# Patient Record
Sex: Female | Born: 1945
Health system: Southern US, Community
[De-identification: ages and names within clinical notes are randomized; demographics above are authoritative.]

## PROBLEM LIST (undated history)

## (undated) DIAGNOSIS — R739 Hyperglycemia, unspecified: Principal | ICD-10-CM

## (undated) DIAGNOSIS — I10 Essential (primary) hypertension: Secondary | ICD-10-CM

## (undated) DIAGNOSIS — Z860100 Personal history of colon polyps, unspecified: Secondary | ICD-10-CM

## (undated) DIAGNOSIS — D219 Benign neoplasm of connective and other soft tissue, unspecified: Secondary | ICD-10-CM

## (undated) DIAGNOSIS — N879 Dysplasia of cervix uteri, unspecified: Secondary | ICD-10-CM

## (undated) HISTORY — DX: Personal history of colonic polyps: Z86.010

## (undated) HISTORY — DX: Personal history of colon polyps, unspecified: Z86.0100

## (undated) HISTORY — PX: OOPHORECTOMY: SHX86

## (undated) HISTORY — DX: Benign neoplasm of connective and other soft tissue, unspecified: D21.9

## (undated) HISTORY — DX: Dysplasia of cervix uteri, unspecified: N87.9

## (undated) HISTORY — PX: BREAST EXCISIONAL BIOPSY: SUR124

## (undated) HISTORY — DX: Hyperglycemia, unspecified: R73.9

## (undated) HISTORY — PX: COLPOSCOPY: SHX161

## (undated) HISTORY — DX: Essential (primary) hypertension: I10

## (undated) HISTORY — PX: HEMORRHOID SURGERY: SHX153

---

## 1973-03-04 HISTORY — PX: LYMPH NODE BIOPSY: SHX201

## 1988-03-04 HISTORY — PX: TONSILLECTOMY: SUR1361

## 1996-03-04 HISTORY — PX: BREAST SURGERY: SHX581

## 1997-03-04 HISTORY — PX: BREAST BIOPSY: SHX20

## 1997-11-04 ENCOUNTER — Ambulatory Visit (HOSPITAL_BASED_OUTPATIENT_CLINIC_OR_DEPARTMENT_OTHER): Admission: RE | Admit: 1997-11-04 | Discharge: 1997-11-04 | Payer: Self-pay

## 1998-03-04 HISTORY — PX: BREAST BIOPSY: SHX20

## 2000-11-13 ENCOUNTER — Encounter: Payer: Self-pay | Admitting: Internal Medicine

## 2000-11-13 ENCOUNTER — Encounter: Admission: RE | Admit: 2000-11-13 | Discharge: 2000-11-13 | Payer: Self-pay | Admitting: Internal Medicine

## 2001-12-04 ENCOUNTER — Encounter: Payer: Self-pay | Admitting: Unknown Physician Specialty

## 2001-12-04 ENCOUNTER — Encounter: Admission: RE | Admit: 2001-12-04 | Discharge: 2001-12-04 | Payer: Self-pay | Admitting: Unknown Physician Specialty

## 2002-03-04 HISTORY — PX: ABDOMINAL HYSTERECTOMY: SHX81

## 2002-07-09 ENCOUNTER — Other Ambulatory Visit: Admission: RE | Admit: 2002-07-09 | Discharge: 2002-07-09 | Payer: Self-pay | Admitting: Obstetrics and Gynecology

## 2002-08-17 ENCOUNTER — Encounter (INDEPENDENT_AMBULATORY_CARE_PROVIDER_SITE_OTHER): Payer: Self-pay | Admitting: Specialist

## 2002-08-17 ENCOUNTER — Ambulatory Visit (HOSPITAL_BASED_OUTPATIENT_CLINIC_OR_DEPARTMENT_OTHER): Admission: RE | Admit: 2002-08-17 | Discharge: 2002-08-17 | Payer: Self-pay | Admitting: Obstetrics and Gynecology

## 2002-08-24 ENCOUNTER — Encounter: Payer: Self-pay | Admitting: Obstetrics and Gynecology

## 2002-08-24 ENCOUNTER — Encounter (INDEPENDENT_AMBULATORY_CARE_PROVIDER_SITE_OTHER): Payer: Self-pay | Admitting: *Deleted

## 2002-08-24 ENCOUNTER — Inpatient Hospital Stay (HOSPITAL_COMMUNITY): Admission: RE | Admit: 2002-08-24 | Discharge: 2002-08-26 | Payer: Self-pay | Admitting: Obstetrics and Gynecology

## 2002-12-10 ENCOUNTER — Encounter: Payer: Self-pay | Admitting: Obstetrics and Gynecology

## 2002-12-10 ENCOUNTER — Encounter: Admission: RE | Admit: 2002-12-10 | Discharge: 2002-12-10 | Payer: Self-pay | Admitting: Obstetrics and Gynecology

## 2003-10-21 ENCOUNTER — Other Ambulatory Visit: Admission: RE | Admit: 2003-10-21 | Discharge: 2003-10-21 | Payer: Self-pay | Admitting: Obstetrics and Gynecology

## 2003-12-16 ENCOUNTER — Encounter: Admission: RE | Admit: 2003-12-16 | Discharge: 2003-12-16 | Payer: Self-pay | Admitting: Obstetrics and Gynecology

## 2004-11-26 ENCOUNTER — Other Ambulatory Visit: Admission: RE | Admit: 2004-11-26 | Discharge: 2004-11-26 | Payer: Self-pay | Admitting: Obstetrics and Gynecology

## 2005-01-01 ENCOUNTER — Encounter: Admission: RE | Admit: 2005-01-01 | Discharge: 2005-01-01 | Payer: Self-pay | Admitting: Obstetrics and Gynecology

## 2005-12-10 ENCOUNTER — Other Ambulatory Visit: Admission: RE | Admit: 2005-12-10 | Discharge: 2005-12-10 | Payer: Self-pay | Admitting: Obstetrics and Gynecology

## 2006-01-03 ENCOUNTER — Encounter: Admission: RE | Admit: 2006-01-03 | Discharge: 2006-01-03 | Payer: Self-pay | Admitting: Obstetrics and Gynecology

## 2006-12-16 ENCOUNTER — Other Ambulatory Visit: Admission: RE | Admit: 2006-12-16 | Discharge: 2006-12-16 | Payer: Self-pay | Admitting: Obstetrics and Gynecology

## 2007-02-05 ENCOUNTER — Encounter: Admission: RE | Admit: 2007-02-05 | Discharge: 2007-02-05 | Payer: Self-pay | Admitting: Obstetrics and Gynecology

## 2007-12-17 ENCOUNTER — Encounter: Payer: Self-pay | Admitting: Obstetrics and Gynecology

## 2007-12-17 ENCOUNTER — Ambulatory Visit: Payer: Self-pay | Admitting: Obstetrics and Gynecology

## 2007-12-17 ENCOUNTER — Other Ambulatory Visit: Admission: RE | Admit: 2007-12-17 | Discharge: 2007-12-17 | Payer: Self-pay | Admitting: Obstetrics and Gynecology

## 2008-02-08 ENCOUNTER — Encounter: Admission: RE | Admit: 2008-02-08 | Discharge: 2008-02-08 | Payer: Self-pay | Admitting: Obstetrics and Gynecology

## 2008-05-24 ENCOUNTER — Ambulatory Visit: Payer: Self-pay | Admitting: Internal Medicine

## 2008-08-15 ENCOUNTER — Ambulatory Visit: Payer: Self-pay | Admitting: Internal Medicine

## 2008-12-19 ENCOUNTER — Encounter: Payer: Self-pay | Admitting: Obstetrics and Gynecology

## 2008-12-19 ENCOUNTER — Other Ambulatory Visit: Admission: RE | Admit: 2008-12-19 | Discharge: 2008-12-19 | Payer: Self-pay | Admitting: Obstetrics and Gynecology

## 2008-12-19 ENCOUNTER — Ambulatory Visit: Payer: Self-pay | Admitting: Obstetrics and Gynecology

## 2009-01-12 ENCOUNTER — Ambulatory Visit: Payer: Self-pay | Admitting: Internal Medicine

## 2009-02-20 ENCOUNTER — Ambulatory Visit: Payer: Self-pay | Admitting: Diagnostic Radiology

## 2009-02-20 ENCOUNTER — Ambulatory Visit (HOSPITAL_BASED_OUTPATIENT_CLINIC_OR_DEPARTMENT_OTHER): Admission: RE | Admit: 2009-02-20 | Discharge: 2009-02-20 | Payer: Self-pay | Admitting: Obstetrics and Gynecology

## 2010-01-15 ENCOUNTER — Other Ambulatory Visit: Admission: RE | Admit: 2010-01-15 | Discharge: 2010-01-15 | Payer: Self-pay | Admitting: Obstetrics and Gynecology

## 2010-01-15 ENCOUNTER — Ambulatory Visit: Payer: Self-pay | Admitting: Obstetrics and Gynecology

## 2010-02-27 ENCOUNTER — Ambulatory Visit (HOSPITAL_BASED_OUTPATIENT_CLINIC_OR_DEPARTMENT_OTHER)
Admission: RE | Admit: 2010-02-27 | Discharge: 2010-02-27 | Payer: Self-pay | Source: Home / Self Care | Attending: Obstetrics and Gynecology | Admitting: Obstetrics and Gynecology

## 2010-07-20 NOTE — Op Note (Signed)
NAMEDAWT, REEB                         ACCOUNT NO.:  [id number]   MEDICAL RECORD NO.:  [id number]                   PATIENT TYPE:  AMB   LOCATION:  [facility]                                 FACILITY:  [facility]   PHYSICIAN:  Daniel L. Eda Paschal, M.D.           DATE OF BIRTH:  Apr 27, 1945   DATE OF PROCEDURE:  08/17/2002  DATE OF DISCHARGE:                                 OPERATIVE REPORT   PREOPERATIVE DIAGNOSES:  1. Atypical endometrial hyperplasia.  2. Dysfunctional uterine bleeding.  3. Leiomyomata uteri.  4. Large ovarian cyst.   POSTOPERATIVE DIAGNOSES:  1. Atypical endometrial hyperplasia.  2. Dysfunctional uterine bleeding.  3. Leiomyomata uteri.  4. Large ovarian cyst.   OPERATION:  Hysteroscopy with dilation and curettage.   SURGEON:  Daniel L. Eda Paschal, M.D.   ANESTHESIA:  General.   FINDINGS:  At the time of D&C the patient's uterus is grossly enlarged, by a  large leiomyoma.  The total size of the uterus is 14-16 week size.  There is  an adnexal mass present on ultrasound, which cannot be appreciated from the  fibroids on exam under anesthesia.  At the time of hysteroscopy, you could  see both anterior and posterior myomas impinging on endometrial cavity, but  neither area was the fibroid actually in the cavity.  Curettings were only  moderate.  There was no active bleeding.  There was no specific focal  lesion.   DESCRIPTION OF PROCEDURE:  After adequate general anesthesia, the patient  was placed in the dorsal lithotomy position and prepped and draped in the  usual sterile manner.  A single-toothed tenaculum was placed in the anterior  lip of the cervix, and the cervical curettings were obtained first.  The  cervix was then dilated, and then endometrial curettings were obtained.  After this was done, the patient was hysteroscoped with a diagnostic  hysteroscope.  Sorbitol 3% was used to expand the intrauterine cavity.  A  camera was used for magnification.   An excellent view was obtained.  The  only focal lesion seen was some endometrium that was still on the posterior  wall that had not been sampled.  Other than that, there was no lesion within  the cavity.  The hysteroscope was therefore removed, after excellent  observation was done of the entire cavity.  Then, further curettings were  taken on the posterior wall to be sure that adequate sampling had been  obtained to rule out a malignancy.  At the termination of the procedure  there was no bleeding noted.  Total blood loss was 50 cc, with none  replaced.  Fluid deficit was 10 cc.  The patient left the operating room in  satisfactory condition.  Daniel L. Eda Paschal, M.D.   [name]  D:  08/17/2002  T:  08/17/2002  Job:  [id number]

## 2010-07-20 NOTE — Op Note (Signed)
NAMESELETHA, ZIMMERMANN                         ACCOUNT NO.:  [id number]   MEDICAL RECORD NO.:  [id number]                   PATIENT TYPE:  INP   LOCATION:  0003                                 FACILITY:  [facility]   PHYSICIAN:  Daniel L. Eda Paschal, M.D.           DATE OF BIRTH:  12/16/1945   DATE OF PROCEDURE:  08/24/2002  DATE OF DISCHARGE:                                 OPERATIVE REPORT   PREOPERATIVE DIAGNOSES:  1. Dysfunctional uterine bleeding.  2. Atypical endometrial hyperplasia.  3. Leiomyomata uteri.  4. Possible adnexal mass.   POSTOPERATIVE DIAGNOSES:  1. Endometrial hyperplasia.  2. Dysfunctional uterine bleeding.  3. Leiomyomata uteri which might be atypical.   OPERATION:  1. Total abdominal hysterectomy.  2. Bilateral salpingo-oophorectomy.  3. Peritoneal washings.   SURGEON:  Daniel L. Eda Paschal, M.D.   FIRST ASSISTANT:  Raynald Kemp, M.D.   SURGICAL CONSULTANT:  De Blanch, M.D.   ANESTHESIA:  General endotracheal.   FINDINGS:  At the time of laparotomy, all of the enlargements seen on  ultrasound were uterine and not adnexal.  There was one very large 10+ cm  mass that was soft and cystic, and there were also multiple small  leiomyomata uteri.  Ovaries and fallopian tubes were completely normal.  Pelvic peritoneum was completely normal.  The rest of the exploration of the  peritoneum was completely normal.   DESCRIPTION OF PROCEDURE:  After adequate general endotracheal anesthesia,  the patient was placed in the supine position, prepped and draped in the  usual sterile manner.  A Foley catheter was inserted in the patient's  bladder.  A midline vertical incision was made and carried through the  fascia and the peritoneum.  Subcutaneous bleeders were clamped and Bovie'd  as encountered.  When the peritoneal cavity was open, the above findings  were noted.  Peritoneal washings were obtained.  The round ligaments were  then sutured and cut.   The vesicouterine fold, the peritoneum was sharply  incised.  The retroperitoneal space was entered.  The uterus were identified  bilaterally.  The infundibulopelvic ligaments were clamped, cut, and doubly  suture ligated.  The bladder flap was advanced by sharp dissection, and the  uterine arteries were clamped, cut, and doubly suture ligated.  Suture  material for all of the above pedicles as well as the following ones on the  parametrium were all #1 chromic catgut.  The bladder was advanced even  further.  A supracervical hysterectomy was done at this point because of the  large mass, and then the cervix was removed by clamping, cutting, and suture  ligating the parametrium with #1 chromic catgut.  The cervicovaginal  junction was identified, entered with sharp dissection.  The cervix was sent  to pathology for tissue diagnosis.  Angled sutures were placed in the angles  of the vagina with #1 chromic catgut.  The vaginal cuff was whip stitched  with  0 Vicryl.  When the angled sutures were placed, they incorporated  parametrium and uterosacral ligaments with good vault support.  There was a  little bit of oozing, most of which could be controlled but because of this,  Surgical was also left in place at the top of the cuff.  Frozen section at  this point came back showing endometrial hyperplasia but an atypical  fibroid.  He felt it was probably now leiomyoma sarcoma, but he could not  unequivocally rule it out.  She did not have any mycotic activity that he  could identify.  Phone consultation with De Blanch, M.D., who  had been on standby was obtained.  He felt that no additional surgery should  be done, and he felt that probably this would come back a degenerative  fibroid.  Two sponge, needle, and instrument counts were correct.  The  peritoneal cavity and the fascia were closed in a single layer with two  running double suture 0 PDS.  The skin was then closed with  staples.  Estimated blood loss for the entire procedure was 500 mL with none replaced.  The patient left the operating room in satisfactory condition draining clear  urine from her Foley catheter.                                               Daniel L. Eda Paschal, M.D.    [name]  D:  08/24/2002  T:  08/24/2002  Job:  [id number]

## 2010-07-20 NOTE — Discharge Summary (Signed)
   [name], [name]                         ACCOUNT NO.:  [id number]   MEDICAL RECORD NO.:  [id number]                   PATIENT TYPE:  INP   LOCATION:  0473                                 FACILITY:  [facility]   PHYSICIAN:  Daniel L. Eda Paschal, M.D.           DATE OF BIRTH:  1945/04/07   DATE OF ADMISSION:  08/24/2002  DATE OF DISCHARGE:  08/26/2002                                 DISCHARGE SUMMARY   REASON FOR HOSPITALIZATION:  The patient is a 65 year old female, who  presented to the office with dysfunctional uterine bleeding and large  fibroids.  Endometrial sampling was done which revealed atypical endometrial  hyperplasia.  As a result of all of the above and her failure to respond to  medical therapy, she now enters the hospital for surgical therapy.  On the  day of admission, she was taken to the operating room.  Exploratory  laparotomy with peritoneal washings, total abdominal hysterectomy, and  bilateral salpingo-oophorectomy was performed.  Frozen section was obtained.  The endometrial hyperplasia was no worse, and therefore there was no  evidence of adenocarcinoma in the endometrium; however, several areas in the  fibroid looked slightly atypical.  They did not look diagnostic for  leiomyosarcoma, but they were somewhat atypical.  A phone consultation was  obtained with De Blanch, M.D., who had been on standby in case  lymph node sampling needed to be done, and he felt that all the appropriate  surgery that needed to be done had already been done.  The surgery was then  terminated.  Postoperatively, she continued uneventfully and by the second  postoperative day, was ready for discharge.   DISCHARGE MEDICATIONS:  Vicodin for pain relief.   DIET:  Regular.   ACTIVITY:  Ambulatory.   FOLLOW UP:  She will return to the office on June 29 for staple removal.   FINAL PATHOLOGY REPORT:  Revealed atypical leiomyoma without evidence of  malignancy.  This had  been sent to [facility] for a second opinion.  The  rest of the final pathology report was benign.   DISCHARGE DIAGNOSES:  1. Dysfunctional uterine bleeding.  2. Atypical endometrial hyperplasia.  3. Leiomyoma uteri with one leiomyoma atypical.   OPERATION:  Total abdominal hysterectomy, bilateral salpingo-oophorectomy.   CONDITION ON DISCHARGE:  Improved.                                               Daniel L. Eda Paschal, M.D.    [name]  D:  09/14/2002  T:  09/14/2002  Job:  [id number]

## 2010-07-20 NOTE — H&P (Signed)
[name], [name]                         ACCOUNT NO.:  [id number]   MEDICAL RECORD NO.:  [id number]                   PATIENT TYPE:  INP   LOCATION:  0003                                 FACILITY:  [facility]   PHYSICIAN:  Daniel L. Eda Paschal, M.D.           DATE OF BIRTH:  27-Aug-1945   DATE OF ADMISSION:  08/24/2002  DATE OF DISCHARGE:                                HISTORY & PHYSICAL   CHIEF COMPLAINT:  Leiomyomata uteri, possible adnexal mass, atypical  endometrial hyperplasia, DUB.   HISTORY OF PRESENT ILLNESS:  The patient is a 65 year old gravida 1, para 1,  AB 0, who first came to see me on June 25, 2002, with a two week history of  menorrhagia.  She had been passing blood clots for two weeks after having a  normal period once a month.  The bleeding was very, very heavy.  When she  was seen in the office, she had a large fibroid uterus of about 10 to 11  weeks' size.  She was initially treated with Megace because she was bleeding  so heavily.  She returned to the office several weeks later.  She underwent  endometrial biopsy and she also underwent pelvic ultrasound.  Pelvic  ultrasound revealed multiple myomas, one was 4 cm, one was 3 cm, one was 2.5  cm.  In addition, there was a large mass that appeared to be in the right  adnexal region.  It was cystic, it was multi-loculated, thick-walled with  numerous septations and a cystic solid focal area in the mass.  Total size  was 12.2 x 8.7 x 11.1 cm, and it was felt that it might be adnexal.  CA-125  was drawn which came back normal.  Endometrial biopsy came back showing  focal atypical complex hyperplasia.  As a result of this, she underwent a  hysteroscopy, D&C, and again tissue removed just showed endometrial  hyperplasia with marked progestational effect.  As a result of all the  above, she now enters the hospital for definitive surgery.  She will undergo  a total abdominal hysterectomy and bilateral  salpingo-oophorectomy.  Dr.  De Blanch is on stand-by in case there is any malignancy present  to do appropriate additional surgery.   PAST MEDICAL HISTORY:  Hypertensive, on Monopril.   ALLERGIES:  She is allergic to no drugs.   FAMILY HISTORY:  Father has had coronary artery disease.  Mother has had  breast cancer.  Mother also has had hypertension.   SOCIAL HISTORY:  She will smoke occasionally.  She drinks alcohol on the  weekends.   REVIEW OF SYSTEMS:  HEENT:  Negative.  CARDIAC:  Hypertension.  RESPIRATORY:  Negative.  GASTROINTESTINAL:  Negative.  MUSCULOSKELETAL:  Negative.  GENITOURINARY:  Negative.  NEUROLOGIC:  Negative.  PSYCHIATRIC:  Negative.  ALLERGIC:  Negative.  IMMUNOLOGIC:  Negative.  LYMPHATIC:  Negative.  ENDOCRINE:  Negative.   PHYSICAL EXAMINATION:  GENERAL:  The patient is a well-developed, well-  nourished female in no acute distress.  VITAL SIGNS:  In the office, the patient's blood pressure is 140/84, but  prior to surgery it always goes up due to severe stress.  Pulse is 80 and  regular.  Respirations are 16 and non-labored.  She is afebrile.  HEENT:  All within normal limits.  NECK:  Supple, trachea midline, thyroid is not enlarged.  LUNGS:  Clear to auscultation and percussion.  HEART:  No thrills, heaves, or murmurs.  BREASTS:  No masses.  ABDOMEN:  Soft without guarding, rebound, or masses.  PELVIC:  External and vaginal is within normal limits.  Cervix is clean.  Pap smear showed no atypia, this was May 2004.  Uterus is enlarged by  multiple masses to about 12 weeks' size.  Adnexa difficult to evaluate due  to size of uterus.  Rectovaginal is negative.  EXTREMITIES:  Within normal limits.   ADMISSION IMPRESSION:  1. Dysfunctional uterine bleeding.  2. Atypical endometrial hyperplasia.  3. Pelvic mass, possible ovarian.   PLAN:  See above.                                                Daniel L. Eda Paschal, M.D.    [name]   D:  08/24/2002  T:  08/24/2002  Job:  [id number]

## 2011-01-08 ENCOUNTER — Encounter: Payer: Self-pay | Admitting: Gynecology

## 2011-01-08 DIAGNOSIS — D219 Benign neoplasm of connective and other soft tissue, unspecified: Secondary | ICD-10-CM | POA: Insufficient documentation

## 2011-01-08 DIAGNOSIS — N8500 Endometrial hyperplasia, unspecified: Secondary | ICD-10-CM | POA: Insufficient documentation

## 2011-01-22 ENCOUNTER — Encounter: Payer: Self-pay | Admitting: Obstetrics and Gynecology

## 2011-01-29 ENCOUNTER — Other Ambulatory Visit: Payer: Self-pay | Admitting: Obstetrics and Gynecology

## 2011-01-29 DIAGNOSIS — Z139 Encounter for screening, unspecified: Secondary | ICD-10-CM

## 2011-03-01 ENCOUNTER — Ambulatory Visit (HOSPITAL_BASED_OUTPATIENT_CLINIC_OR_DEPARTMENT_OTHER)
Admission: RE | Admit: 2011-03-01 | Discharge: 2011-03-01 | Disposition: A | Discharge status: Home / Self Care | Payer: 59 | Source: Ambulatory Visit | Attending: Obstetrics and Gynecology | Admitting: Obstetrics and Gynecology

## 2011-03-01 DIAGNOSIS — Z1231 Encounter for screening mammogram for malignant neoplasm of breast: Secondary | ICD-10-CM | POA: Insufficient documentation

## 2011-03-01 DIAGNOSIS — Z139 Encounter for screening, unspecified: Secondary | ICD-10-CM

## 2011-03-12 ENCOUNTER — Ambulatory Visit (INDEPENDENT_AMBULATORY_CARE_PROVIDER_SITE_OTHER): Payer: 59 | Admitting: Obstetrics and Gynecology

## 2011-03-12 ENCOUNTER — Encounter: Payer: Self-pay | Admitting: Obstetrics and Gynecology

## 2011-03-12 ENCOUNTER — Other Ambulatory Visit (HOSPITAL_COMMUNITY)
Admission: RE | Admit: 2011-03-12 | Discharge: 2011-03-12 | Disposition: A | Discharge status: Home / Self Care | Payer: 59 | Source: Ambulatory Visit | Attending: Obstetrics and Gynecology | Admitting: Obstetrics and Gynecology

## 2011-03-12 VITALS — BP 132/80 | Ht 64.0 in | Wt 170.0 lb

## 2011-03-12 DIAGNOSIS — Z01419 Encounter for gynecological examination (general) (routine) without abnormal findings: Secondary | ICD-10-CM

## 2011-03-12 DIAGNOSIS — N879 Dysplasia of cervix uteri, unspecified: Secondary | ICD-10-CM | POA: Insufficient documentation

## 2011-03-12 LAB — URINALYSIS, ROUTINE W REFLEX MICROSCOPIC
Bilirubin Urine: NEGATIVE
Hgb urine dipstick: NEGATIVE
Ketones, ur: NEGATIVE mg/dL
Nitrite: NEGATIVE

## 2011-03-12 MED ORDER — ESTRADIOL 1 MG PO TABS: 1.0000 mg | ORAL_TABLET | Freq: Every day | ORAL | Status: DC

## 2011-03-12 NOTE — Progress Notes (Signed)
Patient came to see me today for her annual GYN exam. She is doing well on oral estradiol. She is having no hot flashes on it. She is up-to-date on mammograms. She had normal bone density. She does her lab through her PCP. She is having no vaginal bleeding. She is having no pelvic pain.  HEENT: Within normal limits.  [name] present Neck: No masses. Supraclavicular lymph nodes: Not enlarged. Breasts: Examined in both sitting and lying position. Symmetrical without skin changes or masses. Abdomen: Soft no masses guarding or rebound. No hernias. Pelvic: External within normal limits. BUS within normal limits. Vaginal examination shows good estrogen effect, no cystocele enterocele or rectocele. Cervix and uterus absent. Adnexa within normal limits. Rectovaginal confirmatory. Extremities within normal limits.  Assessment: Menopausal symptoms  Plan: Discussed estrogen patch. Patient declined. Continue oral estradiol 1 mg daily.

## 2012-02-24 ENCOUNTER — Other Ambulatory Visit: Payer: Self-pay | Admitting: Obstetrics and Gynecology

## 2012-03-17 ENCOUNTER — Other Ambulatory Visit: Payer: Self-pay | Admitting: Women's Health

## 2012-03-17 DIAGNOSIS — Z1231 Encounter for screening mammogram for malignant neoplasm of breast: Secondary | ICD-10-CM

## 2012-03-19 ENCOUNTER — Encounter: Payer: Self-pay | Admitting: Women's Health

## 2012-03-19 ENCOUNTER — Ambulatory Visit (INDEPENDENT_AMBULATORY_CARE_PROVIDER_SITE_OTHER): Payer: 59 | Admitting: Women's Health

## 2012-03-19 VITALS — BP 138/86 | Ht 64.25 in | Wt 172.0 lb

## 2012-03-19 DIAGNOSIS — Z01419 Encounter for gynecological examination (general) (routine) without abnormal findings: Secondary | ICD-10-CM

## 2012-03-19 DIAGNOSIS — D259 Leiomyoma of uterus, unspecified: Secondary | ICD-10-CM

## 2012-03-19 DIAGNOSIS — Z79890 Hormone replacement therapy (postmenopausal): Secondary | ICD-10-CM

## 2012-03-19 DIAGNOSIS — D219 Benign neoplasm of connective and other soft tissue, unspecified: Secondary | ICD-10-CM

## 2012-03-19 MED ORDER — ESTRADIOL 1 MG PO TABS: 1.0000 mg | ORAL_TABLET | Freq: Every day | ORAL | Status: DC

## 2012-03-19 NOTE — Progress Notes (Signed)
[name] 06/09/1945 [id number]    History:    The patient presents for annual exam.  TAH with BSO 2004 for fibroids and menorrhagia. Estradiol 1 mg daily with good relief of symptoms. History of normal bone density at primary care. Has not had a colonoscopy due to expense. Received zostavac. History of normal Paps and mammograms. Benign breast biopsy 1999. Hypertension/hypercholesterolemia-primary care.  Past medical history, past surgical history, family history and social history were all reviewed and documented in the EPIC chart. Retired. Mother, MGM- breast cancer. Diabetes mother, brother, sister. Heart disease-parents. Two granddaughters.   ROS:  A  ROS was performed and pertinent positives and negatives are included in the history.  Exam:  Filed Vitals:   03/19/12 1051  BP: 138/86    General appearance:  Normal Head/Neck:  Normal, without cervical or supraclavicular adenopathy. Thyroid:  Symmetrical, normal in size, without palpable masses or nodularity. Respiratory  Effort:  Normal  Auscultation:  Clear without wheezing or rhonchi Cardiovascular  Auscultation:  Regular rate, without rubs, murmurs or gallops  Edema/varicosities:  Not grossly evident Abdominal  Soft,nontender, without masses, guarding or rebound.  Liver/spleen:  No organomegaly noted  Hernia:  None appreciated  Skin  Inspection:  Grossly normal  Palpation:  Grossly normal Neurologic/psychiatric  Orientation:  Normal with appropriate conversation.  Mood/affect:  Normal  Genitourinary    Breasts: Examined lying and sitting.     Right: Without masses, retractions, discharge or axillary adenopathy.     Left: Without masses, retractions, discharge or axillary adenopathy.   Inguinal/mons:  Normal without inguinal adenopathy  External genitalia:  Normal  BUS/Urethra/Skene's glands:  Normal  Bladder:  Normal  Vagina:  Normal  Cervix:  Absent  Uterus: Absent  Adnexa/parametria:     Rt: Without  masses or tenderness.   Lt: Without masses or tenderness.  Anus and perineum: Normal  Digital rectal exam: Normal sphincter tone without palpated masses or tenderness  Assessment/Plan:  67 y.o. M. WF G1 P1 for annual exam with no complaints.  TAH/BSO 04-estradiol 1 mg daily Hypertension/hypercholesterolemia-primary care labs and meds  Plan: Estradiol 1 mg daily, prescription, proper use given and reviewed slight risk for blood clots, strokes, breast cancer. States feels better when on. DEXA instructed to repeat this year. SBE's, continue annual mammogram, calcium rich diet, vitamin D 2000 daily encouraged. Home safety and fall prevention discussed. Pneumovax vaccine at primary care at 67, home Hemoccult card given with instructions. Will have colonoscopy at age 51. Pap normal and Paps, reviewed new screening guidelines.    [facility], 12:41 PM 03/19/2012

## 2012-03-19 NOTE — Patient Instructions (Signed)

## 2012-03-19 NOTE — Assessment & Plan Note (Signed)
TAH/BSO 2004

## 2012-03-24 ENCOUNTER — Ambulatory Visit (HOSPITAL_BASED_OUTPATIENT_CLINIC_OR_DEPARTMENT_OTHER)
Admission: RE | Admit: 2012-03-24 | Discharge: 2012-03-24 | Disposition: A | Discharge status: Home / Self Care | Payer: 59 | Source: Ambulatory Visit | Attending: Women's Health | Admitting: Women's Health

## 2012-03-24 DIAGNOSIS — Z1231 Encounter for screening mammogram for malignant neoplasm of breast: Secondary | ICD-10-CM

## 2012-03-24 DIAGNOSIS — R928 Other abnormal and inconclusive findings on diagnostic imaging of breast: Secondary | ICD-10-CM | POA: Insufficient documentation

## 2012-03-26 ENCOUNTER — Other Ambulatory Visit: Payer: Self-pay | Admitting: Women's Health

## 2012-03-26 DIAGNOSIS — R928 Other abnormal and inconclusive findings on diagnostic imaging of breast: Secondary | ICD-10-CM

## 2012-03-27 ENCOUNTER — Other Ambulatory Visit: Payer: Self-pay | Admitting: *Deleted

## 2012-03-27 DIAGNOSIS — R928 Other abnormal and inconclusive findings on diagnostic imaging of breast: Secondary | ICD-10-CM

## 2012-04-02 ENCOUNTER — Other Ambulatory Visit: Payer: Self-pay | Admitting: *Deleted

## 2012-04-02 DIAGNOSIS — N6320 Unspecified lump in the left breast, unspecified quadrant: Secondary | ICD-10-CM

## 2012-04-07 ENCOUNTER — Ambulatory Visit
Admission: RE | Admit: 2012-04-07 | Discharge: 2012-04-07 | Disposition: A | Discharge status: Home / Self Care | Payer: 59 | Source: Ambulatory Visit | Attending: Women's Health | Admitting: Women's Health

## 2012-04-07 DIAGNOSIS — R928 Other abnormal and inconclusive findings on diagnostic imaging of breast: Secondary | ICD-10-CM

## 2012-09-14 ENCOUNTER — Other Ambulatory Visit: Payer: Self-pay | Admitting: Women's Health

## 2012-09-14 DIAGNOSIS — N6320 Unspecified lump in the left breast, unspecified quadrant: Secondary | ICD-10-CM

## 2012-10-08 ENCOUNTER — Other Ambulatory Visit: Payer: Self-pay | Admitting: Gynecology

## 2012-10-08 DIAGNOSIS — N6320 Unspecified lump in the left breast, unspecified quadrant: Secondary | ICD-10-CM

## 2012-10-15 ENCOUNTER — Ambulatory Visit
Admission: RE | Admit: 2012-10-15 | Discharge: 2012-10-15 | Disposition: A | Discharge status: Home / Self Care | Payer: 59 | Source: Ambulatory Visit | Attending: Women's Health | Admitting: Women's Health

## 2012-10-15 DIAGNOSIS — N6320 Unspecified lump in the left breast, unspecified quadrant: Secondary | ICD-10-CM

## 2013-03-16 ENCOUNTER — Other Ambulatory Visit: Payer: Self-pay | Admitting: Women's Health

## 2013-03-16 ENCOUNTER — Other Ambulatory Visit: Payer: Self-pay

## 2013-03-16 DIAGNOSIS — N6320 Unspecified lump in the left breast, unspecified quadrant: Secondary | ICD-10-CM

## 2013-03-19 ENCOUNTER — Other Ambulatory Visit: Payer: Self-pay

## 2013-03-19 ENCOUNTER — Other Ambulatory Visit: Payer: Self-pay | Admitting: Women's Health

## 2013-03-19 DIAGNOSIS — N6320 Unspecified lump in the left breast, unspecified quadrant: Secondary | ICD-10-CM

## 2013-03-23 ENCOUNTER — Encounter: Payer: Self-pay | Admitting: Women's Health

## 2013-03-23 ENCOUNTER — Ambulatory Visit (INDEPENDENT_AMBULATORY_CARE_PROVIDER_SITE_OTHER): Payer: Medicare Other | Admitting: Women's Health

## 2013-03-23 VITALS — BP 128/76 | Ht 64.0 in | Wt 165.4 lb

## 2013-03-23 DIAGNOSIS — Z780 Asymptomatic menopausal state: Secondary | ICD-10-CM

## 2013-03-23 DIAGNOSIS — N879 Dysplasia of cervix uteri, unspecified: Secondary | ICD-10-CM

## 2013-03-23 MED ORDER — ESTRADIOL 0.5 MG PO TABS: 0.5000 mg | ORAL_TABLET | Freq: Every day | ORAL | Status: DC

## 2013-03-23 NOTE — Patient Instructions (Signed)
Health Recommendations for Postmenopausal Women Respected and ongoing research has looked at the most common causes of death, disability, and poor quality of life in postmenopausal women. The causes include heart disease, diseases of blood vessels, diabetes, depression, cancer, and bone loss (osteoporosis). Many things can be done to help lower the chances of developing these and other common problems: CARDIOVASCULAR DISEASE Heart Disease: A heart attack is a medical emergency. Know the signs and symptoms of a heart attack. Below are things women can do to reduce their risk for heart disease.   Do not smoke. If you smoke, quit.  Aim for a healthy weight. Being overweight causes many preventable deaths. Eat a healthy and balanced diet and drink an adequate amount of liquids.  Get moving. Make a commitment to be more physically active. Aim for 30 minutes of activity on most, if not all days of the week.  Eat for heart health. Choose a diet that is low in saturated fat and cholesterol and eliminate trans fat. Include whole grains, vegetables, and fruits. Read and understand the labels on food containers before buying.  Know your numbers. Ask your caregiver to check your blood pressure, cholesterol (total, HDL, LDL, triglycerides) and blood glucose. Work with your caregiver on improving your entire clinical picture.  High blood pressure. Limit or stop your table salt intake (try salt substitute and food seasonings). Avoid salty foods and drinks. Read labels on food containers before buying. Eating well and exercising can help control high blood pressure. STROKE  Stroke is a medical emergency. Stroke may be the result of a blood clot in a blood vessel in the brain or by a brain hemorrhage (bleeding). Know the signs and symptoms of a stroke. To lower the risk of developing a stroke:  Avoid fatty foods.  Quit smoking.  Control your diabetes, blood pressure, and irregular heart rate. THROMBOPHLEBITIS  (BLOOD CLOT) OF THE LEG  Becoming overweight and leading a stationary lifestyle may also contribute to developing blood clots. Controlling your diet and exercising will help lower the risk of developing blood clots. CANCER SCREENING  Breast Cancer: Take steps to reduce your risk of breast cancer.  You should practice "breast self-awareness." This means understanding the normal appearance and feel of your breasts and should include breast self-examination. Any changes detected, no matter how small, should be reported to your caregiver.  After age 40, you should have a clinical breast exam (CBE) every year.  Starting at age 40, you should consider having a mammogram (breast X-ray) every year.  If you have a family history of breast cancer, talk to your caregiver about genetic screening.  If you are at high risk for breast cancer, talk to your caregiver about having an MRI and a mammogram every year.  Intestinal or Stomach Cancer: Tests to consider are a rectal exam, fecal occult blood, sigmoidoscopy, and colonoscopy. Women who are high risk may need to be screened at an earlier age and more often.  Cervical Cancer:  Beginning at age 30, you should have a Pap test every 3 years as long as the past 3 Pap tests have been normal.  If you have had past treatment for cervical cancer or a condition that could lead to cancer, you need Pap tests and screening for cancer for at least 20 years after your treatment.  If you had a hysterectomy for a problem that was not cancer or a condition that could lead to cancer, then you no longer need Pap tests.    If you are between ages 65 and 70, and you have had normal Pap tests going back 10 years, you no longer need Pap tests.  If Pap tests have been discontinued, risk factors (such as a new sexual partner) need to be reassessed to determine if screening should be resumed.  Some medical problems can increase the chance of getting cervical cancer. In these  cases, your caregiver may recommend more frequent screening and Pap tests.  Uterine Cancer: If you have vaginal bleeding after reaching menopause, you should notify your caregiver.  Ovarian cancer: Other than yearly pelvic exams, there are no reliable tests available to screen for ovarian cancer at this time except for yearly pelvic exams.  Lung Cancer: Yearly chest X-rays can detect lung cancer and should be done on high risk women, such as cigarette smokers and women with chronic lung disease (emphysema).  Skin Cancer: A complete body skin exam should be done at your yearly examination. Avoid overexposure to the sun and ultraviolet light lamps. Use a strong sun block cream when in the sun. All of these things are important in lowering the risk of skin cancer. MENOPAUSE Menopause Symptoms: Hormone therapy products are effective for treating symptoms associated with menopause:  Moderate to severe hot flashes.  Night sweats.  Mood swings.  Headaches.  Tiredness.  Loss of sex drive.  Insomnia.  Other symptoms. Hormone replacement carries certain risks, especially in older women. Women who use or are thinking about using estrogen or estrogen with progestin treatments should discuss that with their caregiver. Your caregiver will help you understand the benefits and risks. The ideal dose of hormone replacement therapy is not known. The Food and Drug Administration (FDA) has concluded that hormone therapy should be used only at the lowest doses and for the shortest amount of time to reach treatment goals.  OSTEOPOROSIS Protecting Against Bone Loss and Preventing Fracture: If you use hormone therapy for prevention of bone loss (osteoporosis), the risks for bone loss must outweigh the risk of the therapy. Ask your caregiver about other medications known to be safe and effective for preventing bone loss and fractures. To guard against bone loss or fractures, the following is recommended:  If  you are less than age 50, take 1000 mg of calcium and at least 600 mg of Vitamin D per day.  If you are greater than age 50 but less than age 70, take 1200 mg of calcium and at least 600 mg of Vitamin D per day.  If you are greater than age 70, take 1200 mg of calcium and at least 800 mg of Vitamin D per day. Smoking and excessive alcohol intake increases the risk of osteoporosis. Eat foods rich in calcium and vitamin D and do weight bearing exercises several times a week as your caregiver suggests. DIABETES Diabetes Melitus: If you have Type I or Type 2 diabetes, you should keep your blood sugar under control with diet, exercise and recommended medication. Avoid too many sweets, starchy and fatty foods. Being overweight can make control more difficult. COGNITION AND MEMORY Cognition and Memory: Menopausal hormone therapy is not recommended for the prevention of cognitive disorders such as Alzheimer's disease or memory loss.  DEPRESSION  Depression may occur at any age, but is common in elderly women. The reasons may be because of physical, medical, social (loneliness), or financial problems and needs. If you are experiencing depression because of medical problems and control of symptoms, talk to your caregiver about this. Physical activity and   exercise may help with mood and sleep. Community and volunteer involvement may help your sense of value and worth. If you have depression and you feel that the problem is getting worse or becoming severe, talk to your caregiver about treatment options that are best for you. ACCIDENTS  Accidents are common and can be serious in the elderly woman. Prepare your house to prevent accidents. Eliminate throw rugs, place hand bars in the bath, shower and toilet areas. Avoid wearing high heeled shoes or walking on wet, snowy, and icy areas. Limit or stop driving if you have vision or hearing problems, or you feel you are unsteady with you movements and  reflexes. HEPATITIS C Hepatitis C is a type of viral infection affecting the liver. It is spread mainly through contact with blood from an infected person. It can be treated, but if left untreated, it can lead to severe liver damage over years. Many people who are infected do not know that the virus is in their blood. If you are a "baby-boomer", it is recommended that you have one screening test for Hepatitis C. IMMUNIZATIONS  Several immunizations are important to consider having during your senior years, including:   Tetanus, diptheria, and pertussis booster shot.  Influenza every year before the flu season begins.  Pneumonia vaccine.  Shingles vaccine.  Others as indicated based on your specific needs. Talk to your caregiver about these. Document Released: 04/12/2005 Document Revised: 02/05/2012 Document Reviewed: 12/07/2007 [employer] Patient Information 2014 [employer].  

## 2013-03-23 NOTE — Progress Notes (Addendum)
[name] Jan 29, 1946 [id number]    History:    Presents for breast and pelvic exam. 2004 TAH with BSO for fibroids and endometrial hyperplasia  on Estrace 1 mg daily. Abnormal Pap many years ago without treatment, normal Paps after. Hypertension and hypercholesterolemia managed by primary care. Maternal grandmother breast cancer late in life. Has not had a screening colonoscopy. Is due for followup diagnostic mammogram.  Past medical history, past surgical history, family history and social history were all reviewed and documented in the EPIC chart. Mother hypertension/heart disease/diabetes. Brother and sister also diabetes.   Exam:  Filed Vitals:   03/23/13 0910  BP: 128/76    General appearance:  Normal Thyroid:  Symmetrical, normal in size, without palpable masses or nodularity. Respiratory  Auscultation:  Clear without wheezing or rhonchi Cardiovascular  Auscultation:  Regular rate, without rubs, murmurs or gallops  Edema/varicosities:  Not grossly evident Abdominal  Soft,nontender, without masses, guarding or rebound.  Liver/spleen:  No organomegaly noted  Hernia:  None appreciated  Skin  Inspection:  Grossly normal   Breasts: Examined lying and sitting.     Right: Without masses, retractions, discharge or axillary adenopathy.     Left: Without masses, retractions, discharge or axillary adenopathy. Gentitourinary   Inguinal/mons:  Normal without inguinal adenopathy  External genitalia:  Normal  BUS/Urethra/Skene's glands:  Normal  Vagina:  Normal  Cervix: Absent  Uterus:  Absent Adnexa/parametria:     Rt: Without masses or tenderness.   Lt: Without masses or tenderness.  Anus and perineum: Normal  Digital rectal exam: Normal sphincter tone without palpated masses or tenderness  Assessment/Plan:  68 y.o. MWF G1P1 for breast and pelvic exam.  TAH BSO HRT Hypertension/hypercholesterolemia-primary care manages labs and meds  Plan: Screening colonoscopy  reviewed and encouraged instructed to schedule. Keep scheduled appointment for followup diagnostic mammogram and reviewed 3-D tomography for future mammograms. SBE's, regular exercise, calcium rich diet, vitamin D 2000 daily encouraged. 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 Home safety, fall prevention discussed. Estrace 0.5 mg prescription, proper use, risk for blood clots, strokes, breast cancer reviewed. Instructed to call if problems with decreased dose. Instructed to get Pneumovac at [facility], has had zostavac.   Huel Cote [facility], 12:39 PM 03/23/2013

## 2013-03-24 ENCOUNTER — Ambulatory Visit: Payer: 59

## 2013-03-25 ENCOUNTER — Other Ambulatory Visit: Payer: Self-pay | Admitting: Women's Health

## 2013-03-25 ENCOUNTER — Ambulatory Visit
Admission: RE | Admit: 2013-03-25 | Discharge: 2013-03-25 | Disposition: A | Discharge status: Home / Self Care | Payer: Medicare Other | Source: Ambulatory Visit | Attending: Women's Health | Admitting: Women's Health

## 2013-03-25 ENCOUNTER — Ambulatory Visit: Payer: 59

## 2013-03-25 DIAGNOSIS — N6320 Unspecified lump in the left breast, unspecified quadrant: Secondary | ICD-10-CM

## 2013-03-29 ENCOUNTER — Emergency Department (HOSPITAL_BASED_OUTPATIENT_CLINIC_OR_DEPARTMENT_OTHER)
Admission: EM | Admit: 2013-03-29 | Discharge: 2013-03-29 | Disposition: A | Discharge status: Home / Self Care | Payer: Medicare Other | Attending: Emergency Medicine | Admitting: Emergency Medicine

## 2013-03-29 ENCOUNTER — Encounter (HOSPITAL_BASED_OUTPATIENT_CLINIC_OR_DEPARTMENT_OTHER): Payer: Self-pay | Admitting: Emergency Medicine

## 2013-03-29 DIAGNOSIS — K469 Unspecified abdominal hernia without obstruction or gangrene: Secondary | ICD-10-CM | POA: Insufficient documentation

## 2013-03-29 DIAGNOSIS — Z90710 Acquired absence of both cervix and uterus: Secondary | ICD-10-CM | POA: Insufficient documentation

## 2013-03-29 DIAGNOSIS — Z8639 Personal history of other endocrine, nutritional and metabolic disease: Secondary | ICD-10-CM | POA: Insufficient documentation

## 2013-03-29 DIAGNOSIS — K5900 Constipation, unspecified: Secondary | ICD-10-CM | POA: Insufficient documentation

## 2013-03-29 DIAGNOSIS — K649 Unspecified hemorrhoids: Secondary | ICD-10-CM | POA: Insufficient documentation

## 2013-03-29 DIAGNOSIS — Z862 Personal history of diseases of the blood and blood-forming organs and certain disorders involving the immune mechanism: Secondary | ICD-10-CM | POA: Insufficient documentation

## 2013-03-29 DIAGNOSIS — K625 Hemorrhage of anus and rectum: Secondary | ICD-10-CM | POA: Insufficient documentation

## 2013-03-29 DIAGNOSIS — I10 Essential (primary) hypertension: Secondary | ICD-10-CM | POA: Insufficient documentation

## 2013-03-29 DIAGNOSIS — Z9889 Other specified postprocedural states: Secondary | ICD-10-CM | POA: Insufficient documentation

## 2013-03-29 DIAGNOSIS — Z8742 Personal history of other diseases of the female genital tract: Secondary | ICD-10-CM | POA: Insufficient documentation

## 2013-03-29 DIAGNOSIS — Z79899 Other long term (current) drug therapy: Secondary | ICD-10-CM | POA: Insufficient documentation

## 2013-03-29 DIAGNOSIS — Z87891 Personal history of nicotine dependence: Secondary | ICD-10-CM | POA: Insufficient documentation

## 2013-03-29 MED ORDER — FLEET ENEMA 7-19 GM/118ML RE ENEM
1.0000 | ENEMA | Freq: Once | RECTAL | Status: AC
  Administered 2013-03-29: 1 via RECTAL
  Filled 2013-03-29: qty 1

## 2013-03-29 MED ORDER — LIDOCAINE HCL 2 % EX GEL
Freq: Once | CUTANEOUS | Status: AC
  Administered 2013-03-29: 12:00:00 via TOPICAL
  Filled 2013-03-29: qty 20

## 2013-03-29 MED ORDER — HYDROCODONE-ACETAMINOPHEN 5-325 MG PO TABS: 1.0000 | ORAL_TABLET | ORAL | Status: DC | PRN

## 2013-03-29 MED ORDER — HYDROCODONE-ACETAMINOPHEN 5-325 MG PO TABS
1.0000 | ORAL_TABLET | Freq: Once | ORAL | Status: AC
  Administered 2013-03-29: 1 via ORAL
  Filled 2013-03-29: qty 1

## 2013-03-29 NOTE — ED Provider Notes (Signed)
CSN: [id number]     Arrival date & time 03/29/13  1011 History   First MD Initiated Contact with Patient 03/29/13 1048     Chief Complaint  Patient presents with  . unable to have complete bowel movement  and rectal pain    (Consider location/radiation/quality/duration/timing/severity/associated sxs/prior Treatment) The history is provided by the patient and the spouse. No language interpreter was used.   Pt is a 68 yo white female who presents today with rectal pain and trouble having BMs for two days. Pt states that she has 4-5 attempts at having a BM each day, but has difficulty. Has to strain to make stool pass and BMs are small. Does have some small watery stools intermittently. Rectal pain is worse with standing up and attempting to pass stool. She feels like something is obstructing her anus and complains of it "moving down" with a BM then "sliding back up" afterwards. Has ongoing hx of hemmorhoids and notes rectal bleeding with BMs. Blood is not diffuse; notes specks of blood in toilet bowl and on toilet paper after wiping. Notes the blood is not a bright red but a darker red. Has had an abdominal hernia for about ten years that she has not had surgically repaired. Has tried laxatives, suppositories, and warm soaks with no relief. Denies abdominal pain, difficulty urinating.  Past Medical History  Diagnosis Date  . Endometrial hyperplasia   . Hypertension   . High triglycerides   . Fibroid     Atypical leiomyoma  . Cervical dysplasia    Past Surgical History  Procedure Laterality Date  . Abdominal hysterectomy  2004    TAH_BSO  . Breast surgery      Benign breast lump  . Colposcopy    . Oophorectomy      BSO  . Tonsillectomy     Family History  Problem Relation Age of Onset  . Hypertension Mother   . Heart disease Mother   . Diabetes Mother   . Breast cancer Mother 69  . Heart disease Father   . Diabetes Sister   . Cancer Sister     Leukemia  . Hypertension Brother    . Diabetes Brother   . Stroke Brother   . Breast cancer Maternal Aunt     over 87  . Breast cancer Maternal Grandmother     over 50   History  Substance Use Topics  . Smoking status: Former Research scientist (life sciences)  . Smokeless tobacco: Former Systems developer    Quit date: 03/04/2013  . Alcohol Use: No   OB History   Grav Para Term Preterm Abortions TAB SAB Ect Mult Living   1 1 1       1      Review of Systems  Constitutional: Negative for fever and chills.  Respiratory: Negative for shortness of breath.   Cardiovascular: Negative for chest pain.  Gastrointestinal: Positive for constipation, blood in stool and anal bleeding. Negative for nausea, vomiting, abdominal pain and abdominal distention.  Genitourinary: Negative for dysuria, difficulty urinating and pelvic pain.  Musculoskeletal: Negative for joint swelling and myalgias.  Neurological: Negative for dizziness and light-headedness.   Denies fever, headaches, chest pain, SOB, rashes, edema. Allergies  Review of patient's allergies indicates no known allergies.  Home Medications   Current Outpatient Rx  Name  Route  Sig  Dispense  Refill  . Ascorbic Acid (VITAMIN C PO)   Oral   Take by mouth.           Marland Kitchen  Calcium Carbonate-Vitamin D (CALCIUM + D PO)   Oral   Take by mouth. With zinc and magnesium         . cholecalciferol (VITAMIN D) 1000 UNITS tablet   Oral   Take 1,000 Units by mouth daily.         Marland Kitchen estradiol (ESTRACE) 0.5 MG tablet   Oral   Take 1 tablet (0.5 mg total) by mouth daily.   90 tablet   4   . glucosamine-chondroitin 500-400 MG tablet   Oral   Take 1 tablet by mouth daily.           . metoprolol succinate (TOPROL-XL) 25 MG 24 hr tablet   Oral   Take 50 mg by mouth 2 (two) times daily.          . Multiple Vitamin (MULTIVITAMIN) tablet   Oral   Take 1 tablet by mouth daily.           Marland Kitchen OVER THE COUNTER MEDICATION      Vitamin for hair and nails from [facility]         . telmisartan-hydrochlorothiazide  (MICARDIS HCT) 80-25 MG per tablet   Oral   Take 1 tablet by mouth daily.            BP 162/91  Pulse 70  Temp(Src) 98.6 F (37 C) (Oral)  Resp 18  Ht 5\' 4"  (1.626 m)  Wt 165 lb (74.844 kg)  BMI 28.31 kg/m2  SpO2 98% Physical Exam  Constitutional: She is oriented to person, place, and time. She appears well-developed and well-nourished.  Cardiovascular: Normal rate, regular rhythm and normal heart sounds.   Pulmonary/Chest: Effort normal and breath sounds normal.  Abdominal: Soft. Bowel sounds are normal. She exhibits no distension. There is no tenderness. There is no guarding.  Genitourinary:  Rectal exam found large external hemorrhoid. No evidence of stool in lower rectal vault. Performed hemoccult which is grossly positive, waiting for result.   Neurological: She is alert and oriented to person, place, and time.  Skin: Skin is warm.   Rectal exam ED Course  Procedures (including critical care time) Labs Review Labs Reviewed - No data to display Imaging Review No results found.  EKG Interpretation   None       MDM  No diagnosis found. 1. Constipation  1155 am:   Source of discomfort and constipation c/w impaction. Stool absent to finger depth, suspect high impaction. Waiting for result of hemoccult. Plan: to utilize enema to remove impaction.   Re-eval:  Patient has had a moderate bowel movement with significant relief. Lidocaine jelly with relief of rectal pain. She states she is ready for discharge home.   Dewaine Oats, PA-C 03/29/13 1311

## 2013-03-29 NOTE — ED Provider Notes (Signed)
Medical screening examination/treatment/procedure(s) were performed by non-physician practitioner and as supervising physician I was immediately available for consultation/collaboration.  EKG Interpretation   None         Malvin Johns, MD 03/29/13 [phone]

## 2013-03-29 NOTE — ED Notes (Signed)
ED PA at [facility] 

## 2013-03-29 NOTE — ED Notes (Signed)
Since Saturday has been having pain in rectum along with hemorrhoids that she states will not stay in. Has had previous surgery for same in the 1970's is in constant pain. States she is unable to get a suppository in rectum . Has been soaking but no relief

## 2013-03-29 NOTE — Discharge Instructions (Signed)
RECOMMEND STOOL SOFTENERS, INCREASED DIETARY FIBER. USE PAIN MEDICATION SPARINGLY AS THIS COULD CONTRIBUTE TO CONSTIPATION. RETURN HERE AS NEEDED.   Constipation, Adult Constipation is when a person:  Poops (bowel movement) less than 3 times a week.  Has a hard time pooping.  Has poop that is dry, hard, or bigger than normal. HOME CARE   Eat more fiber, such as fruits, vegetables, whole grains like brown rice, and beans.  Eat less fatty foods and sugar. This includes [place] fries, hamburgers, cookies, candy, and soda.  If you are not getting enough fiber from food, take products with added fiber in them (supplements).  Drink enough fluid to keep your pee (urine) clear or pale yellow.  Go to the restroom when you feel like you need to poop. Do not hold it.  Only take medicine as told by your doctor. Do not take medicines that help you poop (laxatives) without talking to your doctor first.  Exercise on a regular basis, or as told by your doctor. GET HELP RIGHT AWAY IF:   You have bright red blood in your poop (stool).  Your constipation lasts more than 4 days or gets worse.  You have belly (abdomen) or butt (rectal) pain.  You have thin poop (as thin as a pencil).  You lose weight, and it cannot be explained. MAKE SURE YOU:   Understand these instructions.  Will watch your condition.  Will get help right away if you are not doing well or get worse. Document Released: 08/07/2007 Document Revised: 05/13/2011 Document Reviewed: 11/30/2012 [facility] Patient Information 2014 [place], [place].  Fiber Content in Foods Drinking plenty of fluids and consuming foods high in fiber can help with constipation. See the list below for the fiber content of some common foods. Starches and Grains / Dietary Fiber (g)  Cheerios, 1 cup / 3 g  Kellogg's Corn Flakes, 1 cup / 0.7 g  Rice Krispies, 1  cup / 0.3 g  Quaker Oat Life Cereal,  cup / 2.1 g  Oatmeal, instant (cooked),  cup  / 2 g  Kellogg's Frosted Mini Wheats, 1 cup / 5.1 g  Rice, brown, long-grain (cooked), 1 cup / 3.5 g  Rice, white, long-grain (cooked), 1 cup / 0.6 g  Macaroni, cooked, enriched, 1 cup / 2.5 g Legumes / Dietary Fiber (g)  Beans, baked, canned, plain or vegetarian,  cup / 5.2 g  Beans, kidney, canned,  cup / 6.8 g  Beans, pinto, dried (cooked),  cup / 7.7 g  Beans, pinto, canned,  cup / 5.5 g Breads and Crackers / Dietary Fiber (g)  Graham crackers, plain or honey, 2 squares / 0.7 g  Saltine crackers, 3 squares / 0.3 g  Pretzels, plain, salted, 10 pieces / 1.8 g  Bread, whole-wheat, 1 slice / 1.9 g  Bread, white, 1 slice / 0.7 g  Bread, raisin, 1 slice / 1.2 g  Bagel, plain, 3 oz / 2 g  Tortilla, flour, 1 oz / 0.9 g  Tortilla, corn, 1 small / 1.5 g  Bun, hamburger or hotdog, 1 small / 0.9 g Fruits / Dietary Fiber (g)  Apple, raw with skin, 1 medium / 4.4 g  Applesauce, sweetened,  cup / 1.5 g  Banana,  medium / 1.5 g  Grapes, 10 grapes / 0.4 g  Orange, 1 small / 2.3 g  Raisin, 1.5 oz / 1.6 g  Melon, 1 cup / 1.4 g Vegetables / Dietary Fiber (g)  Green beans, canned,  cup / 1.3 g  Carrots (cooked),  cup / 2.3 g  Broccoli (cooked),  cup / 2.8 g  Peas, frozen (cooked),  cup / 4.4 g  Potatoes, mashed,  cup / 1.6 g  Lettuce, 1 cup / 0.5 g  Corn, canned,  cup / 1.6 g  Tomato,  cup / 1.1 g Document Released: 07/07/2006 Document Revised: 05/13/2011 Document Reviewed: 09/01/2006 [facility] Patient Information 2014 [place], [place].

## 2013-03-30 ENCOUNTER — Other Ambulatory Visit: Payer: Self-pay | Admitting: Women's Health

## 2013-03-30 DIAGNOSIS — K649 Unspecified hemorrhoids: Secondary | ICD-10-CM

## 2013-03-30 MED ORDER — HYDROCORTISONE ACE-PRAMOXINE 1-1 % RE FOAM: 1.0000 | Freq: Two times a day (BID) | RECTAL | Status: DC

## 2013-03-30 NOTE — Progress Notes (Signed)
Telephone call from patient states has  hemorrhoids requesting ProctoFoam, prescription e scribed. Instructed to call if no relief.

## 2013-04-14 LAB — HM COLONOSCOPY

## 2013-04-28 ENCOUNTER — Other Ambulatory Visit: Payer: Self-pay | Admitting: Gynecology

## 2013-04-28 DIAGNOSIS — Z780 Asymptomatic menopausal state: Secondary | ICD-10-CM

## 2013-05-13 ENCOUNTER — Encounter: Payer: Self-pay | Admitting: Women's Health

## 2013-06-01 ENCOUNTER — Telehealth: Payer: Self-pay | Admitting: *Deleted

## 2013-06-01 NOTE — Telephone Encounter (Signed)
Prior autorization faxed for estradiol 0.5 mg tablet, will wait for response.

## 2013-06-02 NOTE — Telephone Encounter (Signed)
Estradiol 0.5 mg approved through 06/01/14.

## 2013-08-26 ENCOUNTER — Other Ambulatory Visit: Payer: Self-pay | Admitting: Women's Health

## 2013-08-26 DIAGNOSIS — N63 Unspecified lump in unspecified breast: Secondary | ICD-10-CM

## 2013-09-23 ENCOUNTER — Other Ambulatory Visit: Payer: Medicare Other

## 2013-09-24 ENCOUNTER — Ambulatory Visit
Admission: RE | Admit: 2013-09-24 | Discharge: 2013-09-24 | Disposition: A | Discharge status: Home / Self Care | Payer: 59 | Source: Ambulatory Visit | Attending: Women's Health | Admitting: Women's Health

## 2013-09-24 DIAGNOSIS — N63 Unspecified lump in unspecified breast: Secondary | ICD-10-CM

## 2014-01-03 ENCOUNTER — Encounter (HOSPITAL_BASED_OUTPATIENT_CLINIC_OR_DEPARTMENT_OTHER): Payer: Self-pay | Admitting: Emergency Medicine

## 2014-01-11 ENCOUNTER — Other Ambulatory Visit: Payer: Self-pay | Admitting: Women's Health

## 2014-01-11 DIAGNOSIS — R059 Cough, unspecified: Secondary | ICD-10-CM

## 2014-01-11 DIAGNOSIS — R05 Cough: Secondary | ICD-10-CM

## 2014-01-11 MED ORDER — BENZONATATE 100 MG PO CAPS: 100.0000 mg | ORAL_CAPSULE | Freq: Three times a day (TID) | ORAL | Status: DC | PRN

## 2014-01-11 NOTE — Progress Notes (Signed)
Telephone call from patient requesting Z Pak, reviewed most likely viral, will treat symptoms at this time if no better office visit. Tessalon Perles called in for cough.

## 2014-02-22 ENCOUNTER — Other Ambulatory Visit: Payer: Self-pay | Admitting: Women's Health

## 2014-02-22 DIAGNOSIS — D241 Benign neoplasm of right breast: Secondary | ICD-10-CM

## 2014-02-22 DIAGNOSIS — D242 Benign neoplasm of left breast: Principal | ICD-10-CM

## 2014-03-24 ENCOUNTER — Encounter: Payer: Medicare Other | Admitting: Women's Health

## 2014-03-24 ENCOUNTER — Other Ambulatory Visit: Payer: Self-pay

## 2014-03-24 ENCOUNTER — Other Ambulatory Visit: Payer: Self-pay | Admitting: Women's Health

## 2014-03-24 DIAGNOSIS — D241 Benign neoplasm of right breast: Secondary | ICD-10-CM

## 2014-03-24 DIAGNOSIS — D242 Benign neoplasm of left breast: Principal | ICD-10-CM

## 2014-03-30 ENCOUNTER — Ambulatory Visit
Admission: RE | Admit: 2014-03-30 | Discharge: 2014-03-30 | Disposition: A | Discharge status: Home / Self Care | Payer: Medicare Other | Source: Ambulatory Visit | Attending: Women's Health | Admitting: Women's Health

## 2014-03-30 DIAGNOSIS — D242 Benign neoplasm of left breast: Principal | ICD-10-CM

## 2014-03-30 DIAGNOSIS — D241 Benign neoplasm of right breast: Secondary | ICD-10-CM

## 2014-04-05 ENCOUNTER — Other Ambulatory Visit: Payer: Self-pay | Admitting: Women's Health

## 2014-04-06 ENCOUNTER — Encounter: Payer: Self-pay | Admitting: Women's Health

## 2014-04-06 ENCOUNTER — Other Ambulatory Visit: Payer: Self-pay | Admitting: Women's Health

## 2014-04-06 ENCOUNTER — Ambulatory Visit (INDEPENDENT_AMBULATORY_CARE_PROVIDER_SITE_OTHER): Payer: Medicare Other | Admitting: Women's Health

## 2014-04-06 VITALS — BP 116/75 | Ht 64.0 in | Wt 172.6 lb

## 2014-04-06 DIAGNOSIS — Z01419 Encounter for gynecological examination (general) (routine) without abnormal findings: Secondary | ICD-10-CM

## 2014-04-06 DIAGNOSIS — Z8679 Personal history of other diseases of the circulatory system: Secondary | ICD-10-CM | POA: Insufficient documentation

## 2014-04-06 DIAGNOSIS — Z79890 Hormone replacement therapy (postmenopausal): Secondary | ICD-10-CM

## 2014-04-06 DIAGNOSIS — I10 Essential (primary) hypertension: Secondary | ICD-10-CM

## 2014-04-06 MED ORDER — ESTRADIOL 0.5 MG PO TABS: 0.5000 mg | ORAL_TABLET | Freq: Every day | ORAL | Status: DC

## 2014-04-06 NOTE — Progress Notes (Signed)
[name] [birth year] [id number]    History:    Presents for annual exam.  2004 TAH with BSO for fibroids on Estrace 0.5 mg. Normal Pap and mammogram history. Mammogram normal after diagnostic with ultrasound 2014, completed 2 year series, return to annual screening. Current on vaccines. Hypertension and hypercholesterolemia managed by primary care. 2 /2015 colonoscopy with benign polyps.  Past medical history, past surgical history, family history and social history were all reviewed and documented in the EPIC chart. Retired. Daughter lives in [place] 2 granddaughters. Mother hypertension/heart disease/diabetes. Brother and sister diabetes. Father heart disease and prostate cancer.  ROS:  A ROS was performed and pertinent positives and negatives are included.  Exam:  Filed Vitals:   04/06/14 1019  BP: 116/75    General appearance:  Normal Thyroid:  Symmetrical, normal in size, without palpable masses or nodularity. Respiratory  Auscultation:  Clear without wheezing or rhonchi Cardiovascular  Auscultation:  Regular rate, without rubs, murmurs or gallops  Edema/varicosities:  Not grossly evident Abdominal  Soft,nontender, without masses, guarding or rebound.  Liver/spleen:  No organomegaly noted  Hernia:  None appreciated  Skin  Inspection:  Grossly normal   Breasts: Examined lying and sitting.     Right: Without masses, retractions, discharge or axillary adenopathy.     Left: Without masses, retractions, discharge or axillary adenopathy. Gentitourinary   Inguinal/mons:  Normal without inguinal adenopathy  External genitalia:  Normal  BUS/Urethra/Skene's glands:  Normal  Vagina:  Normal  Cervix:  And uterus absent  Adnexa/parametria:     Rt: Without masses or tenderness.   Lt: Without masses or tenderness.  Anus and perineum: Normal  Digital rectal exam: Normal sphincter tone without palpated masses or tenderness  Assessment/Plan:  69 y.o.  MWF G1 P1 for annual exam.      2004 TAH with BSO for fibroids on Estrace Hypertension/hypercholesterolemia-primary care manages labs and meds 2015 to negative colon Polyps  Plan: SBE's,  annual screening mammogram, 3-D tomography reviewed and encouraged. Continue active lifestyle, regular exercise, home safety, fall prevention reviewed. Vitamin D 2000 daily encouraged. Schedule DEXA. Estrace 0.5 mg daily reviewed risks of blood clots, strokes, breast cancer, had been on 1 mg is sounded 2.5 reviewed cutting in half taking half tablet daily. Reviewed best to take shortest amount of time lowest dose. Reports continues to have some hot flashes now. Normal Pap history the screening guidelines reviewed.    [place], 1:01 PM 04/06/2014

## 2014-04-06 NOTE — Patient Instructions (Signed)

## 2014-05-23 ENCOUNTER — Encounter: Payer: Self-pay | Admitting: Family

## 2014-05-23 ENCOUNTER — Ambulatory Visit (INDEPENDENT_AMBULATORY_CARE_PROVIDER_SITE_OTHER): Payer: Medicare Other | Admitting: Family

## 2014-05-23 VITALS — BP 140/94 | HR 72 | Temp 98.6°F | Resp 12 | Ht 64.0 in | Wt 176.2 lb

## 2014-05-23 DIAGNOSIS — Z86010 Personal history of colonic polyps: Secondary | ICD-10-CM

## 2014-05-23 DIAGNOSIS — E781 Pure hyperglyceridemia: Secondary | ICD-10-CM

## 2014-05-23 DIAGNOSIS — E559 Vitamin D deficiency, unspecified: Secondary | ICD-10-CM

## 2014-05-23 DIAGNOSIS — E119 Type 2 diabetes mellitus without complications: Secondary | ICD-10-CM | POA: Insufficient documentation

## 2014-05-23 DIAGNOSIS — Z780 Asymptomatic menopausal state: Secondary | ICD-10-CM

## 2014-05-23 DIAGNOSIS — I10 Essential (primary) hypertension: Secondary | ICD-10-CM

## 2014-05-23 DIAGNOSIS — R739 Hyperglycemia, unspecified: Secondary | ICD-10-CM

## 2014-05-23 HISTORY — DX: Hyperglycemia, unspecified: R73.9

## 2014-05-23 LAB — BASIC METABOLIC PANEL
BUN: 13 mg/dL (ref 6–23)
CALCIUM: 9.7 mg/dL (ref 8.4–10.5)
CO2: 31 meq/L (ref 19–32)
Chloride: 100 mEq/L (ref 96–112)
Creatinine, Ser: 0.61 mg/dL (ref 0.40–1.20)
GFR: 104.19 mL/min (ref >60.00)
Glucose, Bld: 109 mg/dL — ABNORMAL HIGH (ref 70–99)
Potassium: 3.7 mEq/L (ref 3.5–5.1)
Sodium: 138 mEq/L (ref 135–145)

## 2014-05-23 LAB — LIPID PANEL
CHOL/HDL RATIO: 4
CHOLESTEROL: 215 mg/dL — AB (ref 0–200)
HDL: 52.1 mg/dL (ref >39.00)

## 2014-05-23 LAB — VITAMIN D 25 HYDROXY (VIT D DEFICIENCY, FRACTURES): VITD: 28.59 ng/mL — AB (ref 30.00–100.00)

## 2014-05-23 LAB — HEPATIC FUNCTION PANEL
ALT: 6 U/L (ref 0–35)
AST: 14 U/L (ref 0–37)
Albumin: 4 g/dL (ref 3.5–5.2)
Alkaline Phosphatase: 14 U/L — ABNORMAL LOW (ref 39–117)
BILIRUBIN DIRECT: 0.1 mg/dL (ref 0.0–0.3)
Total Bilirubin: 0.3 mg/dL (ref 0.2–1.2)
Total Protein: 6.4 g/dL (ref 6.0–8.3)

## 2014-05-23 LAB — HEMOGLOBIN A1C: Hgb A1c MFr Bld: 6.2 % (ref 4.6–6.5)

## 2014-05-23 LAB — LDL CHOLESTEROL, DIRECT: Direct LDL: 118 mg/dL

## 2014-05-23 MED ORDER — METOPROLOL TARTRATE 50 MG PO TABS: 50.0000 mg | ORAL_TABLET | Freq: Two times a day (BID) | ORAL | Status: DC

## 2014-05-23 MED ORDER — TELMISARTAN-HCTZ 80-25 MG PO TABS: 1.0000 | ORAL_TABLET | Freq: Every day | ORAL | Status: DC

## 2014-05-23 NOTE — Assessment & Plan Note (Signed)
Pt has colo scheduled on Wednesday with Dr. Collene Mares, encouraged pt to keep this appointment.

## 2014-05-23 NOTE — Progress Notes (Signed)
Pre visit review using our clinic review tool, if applicable. No additional management support is needed unless otherwise documented below in the visit note. 

## 2014-05-23 NOTE — Patient Instructions (Signed)
Please complete lab work prior to leaving. Follow up in 3 months for medicare wellness visit.  Welcome to [facility]!

## 2014-05-23 NOTE — Assessment & Plan Note (Signed)
>>  ASSESSMENT AND PLAN FOR ESSENTIAL HYPERTENSION, BENIGN WRITTEN ON 05/23/2014 12:35 PM BY O'SULLIVAN, Khyran Riera, NP  BP up today.  Has been better on previous visit. Will continue current meds. Obtain bmet.  Consider adjusting meds if still elevated next visit.

## 2014-05-23 NOTE — Progress Notes (Signed)
Subjective:    Patient ID: [name], female    DOB: 25-Apr-1945, 69 y.o.   MRN: [id number]  HPI  Ms. Canul is a 69 yr old female who presents today to establish care. She was followed by [facility] in [place] previously.    HTN-reports that she was diagnosed back in the 1980's.   BP Readings from Last 3 Encounters:  05/23/14 140/94  04/06/14 116/75  03/29/13 162/91  On Micardis-HCT, and lopressor  She is maintained on estrogen which is managed by Elon Alas NP.   Hx of cervical dysplasia- s/p hysterectomy  Hx of colon polyps- last year reports that she has a follow up colo this Wednesday.  Hx of pre-diabetes- reports sugars improved with diet, exercise weight loss.   Review of Systems  Constitutional: Negative for unexpected weight change.  HENT: Negative for hearing loss and rhinorrhea.   Eyes: Negative for visual disturbance.  Respiratory: Negative for cough.   Cardiovascular: Negative for leg swelling.  Gastrointestinal: Negative for nausea, diarrhea and constipation.       Occasional constipatio  Genitourinary: Negative for dysuria and frequency.  Musculoskeletal: Negative for myalgias and arthralgias.  Skin: Negative for rash.  Neurological: Negative for headaches.  Hematological: Negative for adenopathy.  Psychiatric/Behavioral: Negative for dysphoric mood and agitation.   Past Medical History  Diagnosis Date  . Endometrial hyperplasia   . Hypertension   . High triglycerides   . Fibroid     Atypical leiomyoma  . Cervical dysplasia   . History of colon polyps   . Hyperglycemia 05/23/2014    History   Social History  . Marital Status: Married    Spouse Name: N/A  . Number of Children: N/A  . Years of Education: N/A   Occupational History  . Not on file.   Social History Main Topics  . Smoking status: Former Research scientist (life sciences)  . Smokeless tobacco: Former Systems developer    Quit date: 03/04/2013  . Alcohol Use: 0.0 oz/week    0 Standard drinks or  equivalent per week     Comment: 2 glasses wine twice a month  . Drug Use: Not on file  . Sexual Activity: Yes    Birth Control/ Protection: Surgical   Other Topics Concern  . 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   Enjoys reading, yard work, cares for her mother during the day, her brother helps during then night, enjoys poker   1 daughter (in [place]) and 2 grand-daughters (In [place]- age 64 and 54)    Past Surgical History  Procedure Laterality Date  . Colposcopy    . Oophorectomy      BSO  . Tonsillectomy  1990  . Breast surgery  1998    Benign left breast lump  . Abdominal hysterectomy  2004    TAH_BSO  . Lymph node biopsy Left 1975    ? "removed lymph nodes in left leg"--benign per pt  . Hemorrhoid surgery  1980's    Family History  Problem Relation Age of Onset  . Hypertension Mother   . Heart disease Mother   . Diabetes Mother   . Breast cancer Mother 10  . Heart disease Father   . Cancer Father     prostate  . Cancer Sister 15  Leukemia, deceased  . Hypertension Brother   . Diabetes Brother   . Stroke Brother   . Breast cancer Maternal Aunt     over 40  . Breast cancer Maternal Grandmother     over 50    No Known Allergies  Current Outpatient Prescriptions on File Prior to Visit  Medication Sig Dispense Refill  . Ascorbic Acid (VITAMIN C PO) Take by mouth.      . Calcium Carbonate-Vitamin D (CALCIUM + D PO) Take by mouth. With zinc and magnesium    . cholecalciferol (VITAMIN D) 1000 UNITS tablet Take 1,000 Units by mouth daily.    Marland Kitchen estradiol (ESTRACE) 0.5 MG tablet Take 1 tablet (0.5 mg total) by mouth daily. 90 tablet 4  . glucosamine-chondroitin 500-400 MG tablet Take 1 tablet by mouth daily.      . Multiple Vitamin (MULTIVITAMIN) tablet Take 1 tablet by mouth daily.      Marland Kitchen OVER THE COUNTER  MEDICATION Vitamin for hair and nails from [facility]     No current facility-administered medications on file prior to visit.    BP 140/94 mmHg  Pulse 72  Temp(Src) 98.6 F (37 C) (Oral)  Resp 12  Ht 5\' 4"  (1.626 m)  Wt 176 lb 3.2 oz (79.924 kg)  BMI 30.23 kg/m2  LMP 03/04/2002       Objective:   Physical Exam  Constitutional: She is oriented to person, place, and time. She appears well-developed and well-nourished.  HENT:  Head: Normocephalic and atraumatic.  Right Ear: Tympanic membrane and ear canal normal.  Left Ear: Tympanic membrane normal.  Mouth/Throat: No oropharyngeal exudate or posterior oropharyngeal edema.  Neck: No thyromegaly present.  Cardiovascular: Normal rate, regular rhythm and normal heart sounds.   No murmur heard. Pulmonary/Chest: Effort normal and breath sounds normal. No respiratory distress. She has no wheezes.  Abdominal: Soft. There is no tenderness.  Musculoskeletal: She exhibits no edema.  Lymphadenopathy:    She has no cervical adenopathy.  Neurological: She is alert and oriented to person, place, and time.  Psychiatric: She has a normal mood and affect. Her behavior is normal. Judgment and thought content normal.          Assessment & Plan:

## 2014-05-23 NOTE — Assessment & Plan Note (Signed)
Obtain a1c Discussed diabetic diet.

## 2014-05-23 NOTE — Assessment & Plan Note (Signed)
Discussed dietary changes, she will return tomorrow AM for FLP.

## 2014-05-23 NOTE — Assessment & Plan Note (Signed)
BP up today.  Has been better on previous visit. Will continue current meds. Obtain bmet.  Consider adjusting meds if still elevated next visit.

## 2014-05-24 ENCOUNTER — Other Ambulatory Visit: Payer: Self-pay | Admitting: Family

## 2014-05-24 DIAGNOSIS — E559 Vitamin D deficiency, unspecified: Secondary | ICD-10-CM | POA: Insufficient documentation

## 2014-05-24 NOTE — Telephone Encounter (Addendum)
Sugar is showing borderline diabetes.  Cholesterol and triglycerides are above goal.  I would recommend that she add atorvastatin + low fat diet, avoid concentrated sweets white fluffy carbs. Repeat FLP in 3 months. Vit D is low.  Add vit D 50000 units once weekly.  Repeat vit D in 12 weeks.

## 2014-05-25 ENCOUNTER — Telehealth: Payer: Self-pay | Admitting: Family

## 2014-05-25 MED ORDER — FENOFIBRATE 145 MG PO TABS: 145.0000 mg | ORAL_TABLET | Freq: Every day | ORAL | Status: DC

## 2014-05-25 MED ORDER — VITAMIN D (ERGOCALCIFEROL) 1.25 MG (50000 UNIT) PO CAPS: 50000.0000 [IU] | ORAL_CAPSULE | ORAL | Status: DC

## 2014-05-25 NOTE — Telephone Encounter (Signed)
Spoke with pharmacy. They state both meds were just filled on 05/16/14 and Rxs were placed on file. Notified pt and she voices understanding.

## 2014-05-25 NOTE — Telephone Encounter (Signed)
Caller name: Vivia Relation to pt: self Call back number: [phone] Pharmacy:  Reason for call:   Patient states that [facility] did not receive the two prescriptions that were sent on 3/21. Please resend

## 2014-05-25 NOTE — Telephone Encounter (Signed)
rx sent for fenofibrate instead of statin to try.  Re-try vit D and let me know if she has any side effects. Repeat flp and vit D 12 weeks.

## 2014-05-25 NOTE — Telephone Encounter (Signed)
Notified pt. Pt states she took vitamin D 2-3 times a week in the past and thought it made her feel lightheaded. Pt states she is still agreeable to try the once weekly dose if still ok to send? Pt states she is unable to tolerate statins. Took them in the past and they caused severe muscle cramps; "caused her muscles to lock up". Wants to know what else she could try? Future lab orders not entered as pt has f/u in 08/2014 and will complete labs at that visit.

## 2014-05-26 NOTE — Telephone Encounter (Signed)
Left detailed message on voicemail and to call if any questions. 

## 2014-06-24 ENCOUNTER — Telehealth: Payer: Self-pay | Admitting: *Deleted

## 2014-06-24 ENCOUNTER — Other Ambulatory Visit: Payer: Self-pay | Admitting: Women's Health

## 2014-06-24 DIAGNOSIS — Z79890 Hormone replacement therapy (postmenopausal): Secondary | ICD-10-CM

## 2014-06-24 MED ORDER — ESTRADIOL 0.5 MG PO TABS: 0.5000 mg | ORAL_TABLET | Freq: Every day | ORAL | Status: DC

## 2014-06-24 NOTE — Telephone Encounter (Signed)
Prior authorization for estradiol 0.05 mg faxed to [facility], will wait for response.

## 2014-06-27 NOTE — Telephone Encounter (Signed)
She is trying to cut down but still has hot flushes, poor sleep.  Tell her to get at [facility]  and pay cash, should only be about $4 not using insurance.  She is going to try to cut in half and stop hopefully this year.

## 2014-06-27 NOTE — Telephone Encounter (Signed)
Pt medication for estradiol 0.05 mg was denied for not meeting prior authorization requirements she must have tried/failed 2 of the follow medication. Such as citalopram, fluoxetine, gabapentin, venlafaxine. Please advise

## 2014-06-27 NOTE — Telephone Encounter (Signed)
Spoke with patient and she has picked up Rx already and paid out of pocket for Rx. Pt said she is fine paying for Rx

## 2014-08-15 ENCOUNTER — Other Ambulatory Visit: Payer: Self-pay | Admitting: Family

## 2014-08-15 NOTE — Telephone Encounter (Signed)
Received request from [facility] for Vitamin D 50,000units.  Spoke with pt and advised her that she is due for repeat vitamin D level. Pt has follow up on 08/23/14 and she will wait until that time to repeat level. Pt voices understanding. Denial sent to pharmacy.

## 2014-08-22 ENCOUNTER — Telehealth: Payer: Self-pay | Admitting: Behavioral Health

## 2014-08-22 NOTE — Telephone Encounter (Signed)
Unable to reach patient at time of Pre-Visit Call.  Left message for patient to return call when available.    

## 2014-08-23 ENCOUNTER — Ambulatory Visit: Payer: Medicare Other | Admitting: Family

## 2014-09-16 ENCOUNTER — Telehealth: Payer: Self-pay | Admitting: Behavioral Health

## 2014-09-16 ENCOUNTER — Encounter: Payer: Self-pay | Admitting: Behavioral Health

## 2014-09-16 NOTE — Telephone Encounter (Signed)
Unable to reach patient at time of Pre-Visit Call.  Left message for patient to return call when available.    

## 2014-09-19 ENCOUNTER — Encounter: Payer: Self-pay | Admitting: Family

## 2014-09-19 ENCOUNTER — Ambulatory Visit (INDEPENDENT_AMBULATORY_CARE_PROVIDER_SITE_OTHER): Payer: Medicare Other | Admitting: Family

## 2014-09-19 ENCOUNTER — Telehealth: Payer: Self-pay | Admitting: *Deleted

## 2014-09-19 VITALS — BP 130/90 | HR 57 | Temp 97.7°F | Resp 16 | Ht 64.0 in | Wt 173.2 lb

## 2014-09-19 DIAGNOSIS — Z780 Asymptomatic menopausal state: Secondary | ICD-10-CM

## 2014-09-19 DIAGNOSIS — E785 Hyperlipidemia, unspecified: Secondary | ICD-10-CM | POA: Diagnosis not present

## 2014-09-19 DIAGNOSIS — Z0000 Encounter for general adult medical examination without abnormal findings: Secondary | ICD-10-CM | POA: Diagnosis not present

## 2014-09-19 DIAGNOSIS — E559 Vitamin D deficiency, unspecified: Secondary | ICD-10-CM | POA: Diagnosis not present

## 2014-09-19 DIAGNOSIS — I10 Essential (primary) hypertension: Secondary | ICD-10-CM | POA: Diagnosis not present

## 2014-09-19 DIAGNOSIS — Z23 Encounter for immunization: Secondary | ICD-10-CM | POA: Diagnosis not present

## 2014-09-19 LAB — LIPID PANEL
CHOL/HDL RATIO: 4
Cholesterol: 217 mg/dL — ABNORMAL HIGH (ref 0–200)
HDL: 55.5 mg/dL (ref >39.00)
TRIGLYCERIDES: 431 mg/dL — AB (ref 0.0–149.0)

## 2014-09-19 LAB — BASIC METABOLIC PANEL
BUN: 14 mg/dL (ref 6–23)
CO2: 31 mEq/L (ref 19–32)
Calcium: 9.5 mg/dL (ref 8.4–10.5)
Chloride: 99 mEq/L (ref 96–112)
Creatinine, Ser: 0.59 mg/dL (ref 0.40–1.20)
GFR: 108.17 mL/min (ref >60.00)
GLUCOSE: 109 mg/dL — AB (ref 70–99)
POTASSIUM: 3.7 meq/L (ref 3.5–5.1)
Sodium: 138 mEq/L (ref 135–145)

## 2014-09-19 LAB — VITAMIN D 25 HYDROXY (VIT D DEFICIENCY, FRACTURES): VITD: 37.77 ng/mL (ref 30.00–100.00)

## 2014-09-19 LAB — HEPATIC FUNCTION PANEL
ALBUMIN: 4.1 g/dL (ref 3.5–5.2)
ALK PHOS: 14 U/L — AB (ref 39–117)
ALT: 7 U/L (ref 0–35)
AST: 15 U/L (ref 0–37)
BILIRUBIN DIRECT: 0.1 mg/dL (ref 0.0–0.3)
TOTAL PROTEIN: 6.6 g/dL (ref 6.0–8.3)
Total Bilirubin: 0.5 mg/dL (ref 0.2–1.2)

## 2014-09-19 LAB — LDL CHOLESTEROL, DIRECT: Direct LDL: 104 mg/dL

## 2014-09-19 MED ORDER — DOCUSATE SODIUM 100 MG PO CAPS: 100.0000 mg | ORAL_CAPSULE | Freq: Every day | ORAL | Status: DC

## 2014-09-19 MED ORDER — CVS FISH OIL 1000 MG PO CAPS: 2000.0000 | ORAL_CAPSULE | Freq: Two times a day (BID) | ORAL | Status: DC

## 2014-09-19 NOTE — Telephone Encounter (Signed)
Patient has appointment on 09/21/14

## 2014-09-19 NOTE — Progress Notes (Signed)
Subjective:    [name] is a 69 y.o. female who presents for Medicare Annual/Subsequent preventive examination.  Preventive Screening-Counseling & Management  Tobacco History  Smoking status  . Former Smoker  Smokeless tobacco  . Former Systems developer  . Quit date: 03/04/2013     Problems Prior to Visit 1. Vit D def-  On otc vit D 1000units once daily.    2.  Hypertriglyceridemia- has been on tricor but not not currently taking.  Reports that he muscles "locked up."   Lab Results  Component Value Date   CHOL 215* 05/23/2014   HDL 52.10 05/23/2014   LDLDIRECT 118.0 05/23/2014   TRIG * 05/23/2014    439.0 Triglyceride is over 400; calculations on Lipids are invalid.   CHOLHDL 4 05/23/2014   3.  HTN-  maintained on micardis hct, lopressor BP Readings from Last 3 Encounters:  09/19/14 130/90  05/23/14 140/94  04/06/14 116/75   4.   Immunizations:  Tetanus due, declines pneumovax Diet: healthy Exercise: stays active, walks Colonoscopy: 2015 Dexa: due Pap Smear: hysterectomy Mammogram: up to date     Current Problems (verified) Patient Active Problem List   Diagnosis Date Noted  . Vitamin D deficiency 05/24/2014  . Hyperglycemia 05/23/2014  . Hypertriglyceridemia 05/23/2014  . History of colon polyps 05/23/2014  . Essential hypertension, benign 04/06/2014  . Fibroid     Medications Prior to Visit Current Outpatient Prescriptions on File Prior to Visit  Medication Sig Dispense Refill  . Ascorbic Acid (VITAMIN C PO) Take by mouth.      . Calcium Carbonate-Vitamin D (CALCIUM + D PO) Take by mouth. With zinc and magnesium    . cholecalciferol (VITAMIN D) 1000 UNITS tablet Take 1,000 Units by mouth daily.    Marland Kitchen estradiol (ESTRACE) 0.5 MG tablet Take 1 tablet (0.5 mg total) by mouth daily. 90 tablet 3  . glucosamine-chondroitin 500-400 MG tablet Take 1 tablet by mouth daily.      . metoprolol (LOPRESSOR) 50 MG tablet Take 1 tablet (50 mg total) by mouth 2 (two) times  daily. 60 tablet 6  . Multiple Vitamin (MULTIVITAMIN) tablet Take 1 tablet by mouth daily.      Marland Kitchen OVER THE COUNTER MEDICATION Vitamin for hair and nails from [facility]    . telmisartan-hydrochlorothiazide (MICARDIS HCT) 80-25 MG per tablet Take 1 tablet by mouth daily. 30 tablet 5   No current facility-administered medications on file prior to visit.    Current Medications (verified) Current Outpatient Prescriptions  Medication Sig Dispense Refill  . Ascorbic Acid (VITAMIN C PO) Take by mouth.      . Calcium Carbonate-Vitamin D (CALCIUM + D PO) Take by mouth. With zinc and magnesium    . cholecalciferol (VITAMIN D) 1000 UNITS tablet Take 1,000 Units by mouth daily.    Marland Kitchen estradiol (ESTRACE) 0.5 MG tablet Take 1 tablet (0.5 mg total) by mouth daily. 90 tablet 3  . glucosamine-chondroitin 500-400 MG tablet Take 1 tablet by mouth daily.      . metoprolol (LOPRESSOR) 50 MG tablet Take 1 tablet (50 mg total) by mouth 2 (two) times daily. 60 tablet 6  . Multiple Vitamin (MULTIVITAMIN) tablet Take 1 tablet by mouth daily.      Marland Kitchen OVER THE COUNTER MEDICATION Vitamin for hair and nails from [facility]    . telmisartan-hydrochlorothiazide (MICARDIS HCT) 80-25 MG per tablet Take 1 tablet by mouth daily. 30 tablet 5   No current facility-administered medications for this visit.  Allergies (verified) Fenofibrate   PAST HISTORY  Family History Family History  Problem Relation Age of Onset  . Hypertension Mother   . Heart disease Mother   . Diabetes Mother   . Breast cancer Mother 75  . Heart disease Father   . Cancer Father     prostate  . Cancer Sister 17    Leukemia, deceased  . Hypertension Brother   . Diabetes Brother   . Stroke Brother   . Breast cancer Maternal Aunt     over 62  . Breast cancer Maternal Grandmother     over 69    Social History History  Substance Use Topics  . Smoking status: Former Research scientist (life sciences)  . Smokeless tobacco: Former Systems developer    Quit date: 03/04/2013  . Alcohol Use:  0.0 oz/week    0 Standard drinks or equivalent per week     Comment: 2 glasses wine twice a month     Are there smokers in your home (other than you)? No  Risk Factors Current exercise habits: walking  Dietary issues discussed: continue healthy diet   Cardiac risk factors: advanced age (older than 51 for men, 24 for women), dyslipidemia and hypertension.  Depression Screen (Note: if answer to either of the following is "Yes", a more complete depression screening is indicated)   Over the past two weeks, have you felt down, depressed or hopeless? No  Over the past two weeks, have you felt little interest or pleasure in doing things? No  Have you lost interest or pleasure in daily life? No  Do you often feel hopeless? No  Do you cry easily over simple problems? No  Activities of Daily Living In your present state of health, do you have any difficulty performing the following activities?:  Driving? No Managing money?  No Feeding yourself? No Getting from bed to chair? No Climbing a flight of stairs? No Preparing food and eating?: No Bathing or showering? No Getting dressed: No Getting to the toilet? No Using the toilet:No Moving around from place to place: No In the past year have you fallen or had a near fall?:No   Are you sexually active?  Yes  Do you have more than one partner?  No  Hearing Difficulties: No Do you often ask people to speak up or repeat themselves? No Do you experience ringing or noises in your ears? No Do you have difficulty understanding soft or whispered voices? No   Do you feel that you have a problem with memory? No  Do you often misplace items? Yes- occasional  Do you feel safe at home?  No  Cognitive Testing  Alert? Yes  Normal Appearance?Yes  Oriented to person? Yes  Place? Yes   Time? Yes  Recall of three objects?  Yes  Can perform simple calculations? Yes  Displays appropriate judgment?Yes  Can read the correct time from a watch  face?Yes   Advanced Directives have been discussed with the patient? Yes  List the Names of Other Physician/Practitioners you currently use: 1.  Elon Alas NP  Indicate any recent Medical Services you may have received from other than [facility] providers in the past year (date may be approximate).  Immunization History  Administered Date(s) Administered  . Zoster 03/04/2012    Screening Tests Health Maintenance  Topic Date Due  . TETANUS/TDAP  01/18/1967  . DEXA SCAN  01/17/2013  . PNA vac Low Risk Adult (1 of 2 - PCV13) 01/17/2013  . INFLUENZA VACCINE  05/23/2019 (  Originally 10/03/2014)  . PAP SMEAR  05/23/2034 (Originally 03/11/2012)  . MAMMOGRAM  03/30/2016  . COLONOSCOPY  04/14/2018  . ZOSTAVAX  Completed    All answers were reviewed with the patient and necessary referrals were made:  O'SULLIVAN,Darcey Demma S., NP   09/19/2014   History reviewed: allergies, current medications, past family history, past medical history, past social history, past surgical history and problem list  Review of Systems .   Review of Systems  Constitutional: Negative for weight loss.  HENT: Negative for congestion.   Eyes: Negative for blurred vision.  Respiratory: Negative for cough.   Cardiovascular: Negative for chest pain and orthopnea.  Gastrointestinal: Negative for nausea, vomiting and diarrhea.  Genitourinary: Negative for dysuria and frequency.  Musculoskeletal: Negative for myalgias and joint pain.  Skin: Negative for rash.  Neurological:       Denies HA  Psychiatric/Behavioral: Negative for depression.      Objective:       Body mass index is 29.72 kg/(m^2). BP 130/90 mmHg  Pulse 57  Temp(Src) 97.7 F (36.5 C) (Oral)  Resp 16  Ht 5\' 4"  (1.626 m)  Wt 173 lb 3.2 oz (78.563 kg)  BMI 29.72 kg/m2  SpO2 99%  LMP 03/04/2002     Assessment:     Physical Exam  Constitutional: She is oriented to person, place, and time. She appears well-developed and well-nourished. No  distress.  HENT:  Head: Normocephalic and atraumatic.  Right Ear: Tympanic membrane and ear canal normal.  Left Ear: Tympanic membrane and ear canal normal.  Mouth/Throat: Oropharynx is clear and moist.  Eyes: Pupils are equal, round, and reactive to light. No scleral icterus.  Neck: Normal range of motion. No thyromegaly present.  Cardiovascular: Normal rate and regular rhythm.   No murmur heard. Pulmonary/Chest: Effort normal and breath sounds normal. No respiratory distress. He has no wheezes. She has no rales. She exhibits no tenderness.  Abdominal: Soft. Bowel sounds are normal. He exhibits no distension and no mass. There is no tenderness. There is no rebound and no guarding.  Musculoskeletal: She exhibits no edema.  Lymphadenopathy:    She has no cervical adenopathy.  Neurological: She is alert and oriented to person, place, and time. She has normal reflexes. She exhibits normal muscle tone. Coordination normal.  Skin: Skin is warm and dry.  Psychiatric: She has a normal mood and affect. Her behavior is normal. Judgment and thought content normal.  Breasts: Examined lying Right: Without masses, retractions, discharge or axillary adenopathy.  Left: Without masses, retractions, discharge or axillary adenopathy.           Assessment & Plan:         Plan:    EKG is performed.  Notes no acute changes.  No EKG available for comparison.  During the course of the visit the patient was educated and counseled about appropriate screening and preventive services including:    Bone densitometry screening  Nutrition counseling   Diet review for nutrition referral? Yes ____  Not Indicated _x___   Patient Instructions (the written plan) was given to the patient.  Medicare Attestation I have personally reviewed: The patient's medical and social history Their use of alcohol, tobacco or illicit drugs Their current medications and supplements The patient's functional ability  including ADLs,fall risks, home safety risks, cognitive, and hearing and visual impairment Diet and physical activities Evidence for depression or mood disorders  The patient's weight, height, BMI, and visual acuity have been recorded in the chart.  I have made referrals, counseling, and provided education to the patient based on review of the above and I have provided the patient with a written personalized care plan for preventive services.     O'SULLIVAN,Perley Arthurs S., NP   09/19/2014

## 2014-09-19 NOTE — Patient Instructions (Addendum)
Please complete lab work prior to leaving. You will be contacted about your bone density test.  Continue healthy diet, exercise and weight loss efforts. Restart fish oil.  Follow up in 6 months.

## 2014-09-19 NOTE — Telephone Encounter (Signed)
[name]-- Pt was here for her wellness exam today and states she was never contacted about her DEXA scan that we ordered in March?  Can you check on the status of this and let pt know?  Thank You!

## 2014-09-19 NOTE — Progress Notes (Signed)
Pre visit review using our clinic review tool, if applicable. No additional management support is needed unless otherwise documented below in the visit note. 

## 2014-09-21 ENCOUNTER — Ambulatory Visit (HOSPITAL_BASED_OUTPATIENT_CLINIC_OR_DEPARTMENT_OTHER)
Admission: RE | Admit: 2014-09-21 | Discharge: 2014-09-21 | Disposition: A | Discharge status: Home / Self Care | Payer: Medicare Other | Source: Ambulatory Visit | Attending: Family | Admitting: Family

## 2014-09-21 DIAGNOSIS — Z780 Asymptomatic menopausal state: Secondary | ICD-10-CM | POA: Insufficient documentation

## 2014-09-22 ENCOUNTER — Telehealth: Payer: Self-pay | Admitting: Family

## 2014-09-22 NOTE — Telephone Encounter (Signed)
Spoke with pt and she has been tricor, atorvastatin with muscle aches and cramps. Will try otc fish oil at this time. Pt voices understanding and will continue to limit white breads and pastas. Pt states she will work on losing weight. Please advise. HM// CMA

## 2014-09-22 NOTE — Telephone Encounter (Signed)
Trigs very high. Please work on avoiding concentrated sweets, and limiting white carbs (rice/bread/pasta/potatoes). Instead substitute whole grain versions with reasonable portions.   I know she can't take fenofibrate. Has she ever tried a statin medication.  If not, I would recommend trial of atorvastatin 20mg  once daily and repeat flp in 3 months.

## 2014-09-22 NOTE — Telephone Encounter (Signed)
LMOM on home number for pt to call back about recent lab results. Hurt

## 2014-09-22 NOTE — Telephone Encounter (Signed)
Patient returning your call.

## 2014-09-22 NOTE — Telephone Encounter (Signed)
No further recs at this time.

## 2014-09-23 ENCOUNTER — Encounter: Payer: Self-pay | Admitting: Family

## 2014-09-25 DIAGNOSIS — Z0000 Encounter for general adult medical examination without abnormal findings: Secondary | ICD-10-CM | POA: Insufficient documentation

## 2014-09-25 NOTE — Assessment & Plan Note (Signed)
Immunizations reviewed and up to date.  Continue healthy diet, exercise weight loss efforts.  Obtain routine labs.

## 2014-11-10 ENCOUNTER — Telehealth: Payer: Self-pay | Admitting: Family

## 2014-11-10 NOTE — Telephone Encounter (Signed)
Relation to LY:YTKP Call back [phone] Pharmacy:  [id number] - [place], [place] (Phone) [phone] (Fax)         Reason for call:  Patient requesting a refill metoprolol (LOPRESSOR) 50 MG tablet and telmisartan-hydrochlorothiazide (MICARDIS HCT) 80-25 MG per tablet

## 2014-11-11 MED ORDER — TELMISARTAN-HCTZ 80-25 MG PO TABS: 1.0000 | ORAL_TABLET | Freq: Every day | ORAL | Status: DC

## 2014-11-11 MED ORDER — METOPROLOL TARTRATE 50 MG PO TABS: 50.0000 mg | ORAL_TABLET | Freq: Two times a day (BID) | ORAL | Status: DC

## 2014-11-11 NOTE — Telephone Encounter (Signed)
Refills sent, notified pt. 

## 2015-03-07 ENCOUNTER — Other Ambulatory Visit: Payer: Self-pay | Admitting: Women's Health

## 2015-03-07 DIAGNOSIS — N6320 Unspecified lump in the left breast, unspecified quadrant: Principal | ICD-10-CM

## 2015-03-07 DIAGNOSIS — N6310 Unspecified lump in the right breast, unspecified quadrant: Secondary | ICD-10-CM

## 2015-03-22 ENCOUNTER — Telehealth: Payer: Self-pay | Admitting: Family

## 2015-03-22 ENCOUNTER — Ambulatory Visit (INDEPENDENT_AMBULATORY_CARE_PROVIDER_SITE_OTHER): Payer: Medicare Other | Admitting: Family

## 2015-03-22 ENCOUNTER — Encounter: Payer: Self-pay | Admitting: Family

## 2015-03-22 VITALS — BP 128/82 | HR 56 | Temp 98.5°F | Resp 16 | Ht 67.0 in | Wt 171.0 lb

## 2015-03-22 DIAGNOSIS — E781 Pure hyperglyceridemia: Secondary | ICD-10-CM | POA: Diagnosis not present

## 2015-03-22 DIAGNOSIS — I10 Essential (primary) hypertension: Secondary | ICD-10-CM | POA: Diagnosis not present

## 2015-03-22 DIAGNOSIS — E559 Vitamin D deficiency, unspecified: Secondary | ICD-10-CM | POA: Diagnosis not present

## 2015-03-22 DIAGNOSIS — Z0000 Encounter for general adult medical examination without abnormal findings: Secondary | ICD-10-CM

## 2015-03-22 LAB — BASIC METABOLIC PANEL
BUN: 12 mg/dL (ref 6–23)
CALCIUM: 9.5 mg/dL (ref 8.4–10.5)
CO2: 30 meq/L (ref 19–32)
Chloride: 100 mEq/L (ref 96–112)
Creatinine, Ser: 0.63 mg/dL (ref 0.40–1.20)
GFR: 100.13 mL/min (ref >60.00)
GLUCOSE: 108 mg/dL — AB (ref 70–99)
Potassium: 3.7 mEq/L (ref 3.5–5.1)
SODIUM: 139 meq/L (ref 135–145)

## 2015-03-22 LAB — VITAMIN D 25 HYDROXY (VIT D DEFICIENCY, FRACTURES): VITD: 34.05 ng/mL (ref 30.00–100.00)

## 2015-03-22 MED ORDER — TELMISARTAN-HCTZ 80-25 MG PO TABS: 1.0000 | ORAL_TABLET | Freq: Every day | ORAL | Status: DC

## 2015-03-22 MED ORDER — METOPROLOL TARTRATE 50 MG PO TABS: 50.0000 mg | ORAL_TABLET | Freq: Two times a day (BID) | ORAL | Status: DC

## 2015-03-22 NOTE — Telephone Encounter (Signed)
Patient scheduled medicare wellness for  09/22/2015

## 2015-03-22 NOTE — Assessment & Plan Note (Signed)
>>  ASSESSMENT AND PLAN FOR ESSENTIAL HYPERTENSION, BENIGN WRITTEN ON 03/22/2015  9:10 AM BY O'SULLIVAN, Danashia Landers, NP  BP stable on current meds.  Obtain follow up bmet.

## 2015-03-22 NOTE — Assessment & Plan Note (Signed)
BP stable on current meds. Obtain follow up bmet.  

## 2015-03-22 NOTE — Telephone Encounter (Signed)
Noted  

## 2015-03-22 NOTE — Progress Notes (Signed)
Subjective:    Patient ID: [name], female    DOB: 01-May-1945, 70 y.o.   MRN: [id number]  HPI  [name] is a 70 yr old female who presents today for follow up.  1) HTN- patient is maintained on micardis hct and lopressor BP Readings from Last 3 Encounters:  03/22/15 128/82  09/19/14 130/90  05/23/14 140/94   2) Hyperlipidemia- has been on fish oil.   Watching her diet. Intolerant to fenofibrate and to  Lab Results  Component Value Date   CHOL 217* 09/19/2014   HDL 55.50 09/19/2014   LDLDIRECT 104.0 09/19/2014   TRIG 431.0* 09/19/2014   CHOLHDL 4 09/19/2014   3) Vit D deficiency-  Patient reports that she is taking otc vit D supplement.   Review of Systems  Respiratory: Negative for shortness of breath.   Cardiovascular: Negative for chest pain and leg swelling.   Past Medical History  Diagnosis Date  . Endometrial hyperplasia   . Hypertension   . High triglycerides   . Fibroid     Atypical leiomyoma  . Cervical dysplasia   . History of colon polyps   . Hyperglycemia 05/23/2014    Social History   Social History  . Marital Status: Married    Spouse Name: N/A  . Number of Children: N/A  . Years of Education: N/A   Occupational History  . Not on file.   Social History Main Topics  . Smoking status: Former Research scientist (life sciences)  . Smokeless tobacco: Former Systems developer    Quit date: 03/04/2013  . Alcohol Use: 0.0 oz/week    0 Standard drinks or equivalent per week     Comment: 2 glasses wine twice a month  . Drug Use: Not on file  . Sexual Activity: Yes    Birth Control/ Protection: Surgical   Other Topics Concern  . 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   Enjoys reading, yard work, cares for her mother during the day, her brother helps during then night, enjoys poker   1 daughter (in [place]) and 2  grand-daughters (In [place]- age 74 and 22)    Past Surgical History  Procedure Laterality Date  . Colposcopy    . Oophorectomy      BSO  . Tonsillectomy  1990  . Breast surgery  1998    Benign left breast lump  . Abdominal hysterectomy  2004    TAH_BSO  . Lymph node biopsy Left 1975    ? "removed lymph nodes in left leg"--benign per pt  . Hemorrhoid surgery  1980's    Family History  Problem Relation Age of Onset  . Hypertension Mother   . Heart disease Mother   . Diabetes Mother   . Breast cancer Mother 28  . Heart disease Father   . Cancer Father     prostate  . Cancer Sister 107    Leukemia, deceased  . Hypertension Brother   . Diabetes Brother   . Stroke Brother   . Breast cancer Maternal Aunt     over 61  . Breast cancer Maternal Grandmother     over 50    Allergies  Allergen Reactions  . Atorvastatin     Muscle cramps  . Fenofibrate     "Locked her muscles up"  Current Outpatient Prescriptions on File Prior to Visit  Medication Sig Dispense Refill  . Ascorbic Acid (VITAMIN C PO) Take by mouth.      . Calcium Carbonate-Vitamin D (CALCIUM + D PO) Take by mouth. With zinc and magnesium    . cholecalciferol (VITAMIN D) 1000 UNITS tablet Take 1,000 Units by mouth daily.    Marland Kitchen docusate sodium (COLACE) 100 MG capsule Take 1 capsule (100 mg total) by mouth at bedtime. 10 capsule 0  . estradiol (ESTRACE) 0.5 MG tablet Take 1 tablet (0.5 mg total) by mouth daily. 90 tablet 3  . glucosamine-chondroitin 500-400 MG tablet Take 1 tablet by mouth daily.      . Multiple Vitamin (MULTIVITAMIN) tablet Take 1 tablet by mouth daily.      . Omega-3 Fatty Acids (CVS FISH OIL) 1000 MG CAPS Take 2,000 capsules by mouth 2 (two) times daily. 90 capsule   . OVER THE COUNTER MEDICATION Vitamin for hair and nails from [facility]     No current facility-administered medications on file prior to visit.    BP 128/82 mmHg  Pulse 56  Temp(Src) 98.5 F (36.9 C) (Oral)  Resp 16  Ht 5\' 7"   (1.702 m)  Wt 171 lb (77.565 kg)  BMI 26.78 kg/m2  SpO2 100%  LMP 03/04/2002       Objective:   Physical Exam  Constitutional: She is oriented to person, place, and time. She appears well-developed and well-nourished.  HENT:  Head: Normocephalic and atraumatic.  Cardiovascular: Normal rate, regular rhythm and normal heart sounds.   No murmur heard. Pulmonary/Chest: Effort normal and breath sounds normal. No respiratory distress. She has no wheezes.  Musculoskeletal: She exhibits no edema.  Neurological: She is alert and oriented to person, place, and time.  Psychiatric: She has a normal mood and affect. Her behavior is normal. Judgment and thought content normal.          Assessment & Plan:  Declines prevnar, declines flu shot.

## 2015-03-22 NOTE — Assessment & Plan Note (Signed)
Obtain flp, continue low fat/low cholesterol diet and fish oil.

## 2015-03-22 NOTE — Assessment & Plan Note (Signed)
Continue otc supplement, obtain follow up vit D level.

## 2015-03-22 NOTE — Patient Instructions (Addendum)
Please complete lab work prior to leaving.  Follow up after 7/18 for your medicare wellness visit.

## 2015-03-22 NOTE — Progress Notes (Signed)
Pre visit review using our clinic review tool, if applicable. No additional management support is needed unless otherwise documented below in the visit note. 

## 2015-03-23 ENCOUNTER — Encounter: Payer: Self-pay | Admitting: Family

## 2015-04-03 ENCOUNTER — Other Ambulatory Visit: Payer: Medicare Other

## 2015-04-05 ENCOUNTER — Ambulatory Visit
Admission: RE | Admit: 2015-04-05 | Discharge: 2015-04-05 | Disposition: A | Discharge status: Home / Self Care | Payer: Medicare Other | Source: Ambulatory Visit | Attending: Women's Health | Admitting: Women's Health

## 2015-04-05 ENCOUNTER — Encounter: Payer: Self-pay | Admitting: Women's Health

## 2015-04-05 DIAGNOSIS — N6320 Unspecified lump in the left breast, unspecified quadrant: Principal | ICD-10-CM

## 2015-04-05 DIAGNOSIS — N6310 Unspecified lump in the right breast, unspecified quadrant: Secondary | ICD-10-CM

## 2015-04-12 ENCOUNTER — Encounter: Payer: Self-pay | Admitting: Women's Health

## 2015-04-12 ENCOUNTER — Ambulatory Visit (INDEPENDENT_AMBULATORY_CARE_PROVIDER_SITE_OTHER): Payer: Medicare Other | Admitting: Women's Health

## 2015-04-12 VITALS — BP 140/82 | Ht 67.0 in | Wt 175.0 lb

## 2015-04-12 DIAGNOSIS — Z79890 Hormone replacement therapy (postmenopausal): Secondary | ICD-10-CM | POA: Diagnosis not present

## 2015-04-12 MED ORDER — ESTRADIOL 0.5 MG PO TABS: 0.5000 mg | ORAL_TABLET | Freq: Every day | ORAL | Status: DC

## 2015-04-12 NOTE — Patient Instructions (Signed)

## 2015-04-12 NOTE — Progress Notes (Signed)
[name] [birth year] [id number]    History:    Presents for breast and pelvic exam. 2004 TAH with BSO for fibroids on Estrace with continued hot flushes. Normal Pap and mammogram history mammogram this month normal after ultrasound. Hypertension, hypercholesterolemia managed by primary care. 2015 benign polyps. 2016 DEXA normal. Had Zostavax not Pneumovax.   Past medical history, past surgical history, family history and social history were all reviewed and documented in the EPIC chart. Mother, brother, sister diabetes, mother breast cancer.  ROS:  A ROS was performed and pertinent positives and negatives are included.  Exam:  Filed Vitals:   04/12/15 0845  BP: 140/82    General appearance:  Normal Thyroid:  Symmetrical, normal in size, without palpable masses or nodularity. Respiratory  Auscultation:  Clear without wheezing or rhonchi Cardiovascular  Auscultation:  Regular rate, without rubs, murmurs or gallops  Edema/varicosities:  Not grossly evident Abdominal  Soft,nontender, without masses, guarding or rebound.  Liver/spleen:  No organomegaly noted  Hernia:  None appreciated  Skin  Inspection:  Grossly normal   Breasts: Examined lying and sitting.     Right: Without masses, retractions, discharge or axillary adenopathy.     Left: Without masses, retractions, discharge or axillary adenopathy. Gentitourinary   Inguinal/mons:  Normal without inguinal adenopathy  External genitalia:  Normal  BUS/Urethra/Skene's glands:  Normal  Vagina:  +1 rectocele  Cervix:  Uterus absent Adnexa/parametria:     Rt: Without masses or tenderness.   Lt: Without masses or tenderness.  Anus and perineum: Normal  Digital rectal exam: Normal sphincter tone without palpated masses or tenderness  Assessment/Plan:  70 y.o. MWF G1 P1 for breast and pelvic exam.  2004 TAH with BSO for fibroids on Estrace Hypertension and hypercholesterolemia-primary care manages labs and meds 2016 normal  DEXA  Plan: HRT reviewed risks of blood clots, strokes and breast cancer, reports continues with occasional hot flashes and prefers to stay on, Estrace 0.5 prescription, proper use given and reviewed to be on shortest amount of time. Home safety, fall prevention and importance of regular weightbearing exercise reviewed. Pneumovax vaccine recommended instructed to have a primary care. SBE's, continue annual 3-D screening mammogram.    Huel Cote [facility], 9:28 AM 04/12/2015

## 2015-06-12 ENCOUNTER — Other Ambulatory Visit: Payer: Self-pay

## 2015-06-12 DIAGNOSIS — Z79890 Hormone replacement therapy (postmenopausal): Secondary | ICD-10-CM

## 2015-06-12 MED ORDER — ESTRADIOL 0.5 MG PO TABS: 0.5000 mg | ORAL_TABLET | Freq: Every day | ORAL | Status: DC

## 2015-09-08 ENCOUNTER — Other Ambulatory Visit: Payer: Self-pay | Admitting: Women's Health

## 2015-09-12 ENCOUNTER — Other Ambulatory Visit: Payer: Self-pay | Admitting: Family

## 2015-09-13 NOTE — Telephone Encounter (Signed)
Rx sent to the pharmacy by e-script.//AB/CMA 

## 2015-09-21 ENCOUNTER — Telehealth: Payer: Self-pay

## 2015-09-21 NOTE — Telephone Encounter (Signed)
Called patient to remind her of Medicare Well visit tomorrow. Left message on answering machine.

## 2015-09-22 ENCOUNTER — Telehealth: Payer: Self-pay | Admitting: Family

## 2015-09-22 ENCOUNTER — Other Ambulatory Visit: Payer: Self-pay | Admitting: *Deleted

## 2015-09-22 ENCOUNTER — Ambulatory Visit (INDEPENDENT_AMBULATORY_CARE_PROVIDER_SITE_OTHER): Payer: Medicare Other

## 2015-09-22 VITALS — BP 134/86 | HR 61 | Resp 16 | Ht 63.5 in | Wt 166.2 lb

## 2015-09-22 DIAGNOSIS — E785 Hyperlipidemia, unspecified: Secondary | ICD-10-CM

## 2015-09-22 DIAGNOSIS — R739 Hyperglycemia, unspecified: Secondary | ICD-10-CM

## 2015-09-22 DIAGNOSIS — Z0000 Encounter for general adult medical examination without abnormal findings: Secondary | ICD-10-CM

## 2015-09-22 LAB — LIPID PANEL
CHOL/HDL RATIO: 4
Cholesterol: 219 mg/dL — ABNORMAL HIGH (ref 0–200)
HDL: 61.6 mg/dL (ref >39.00)
NonHDL: 157.25
Triglycerides: 326 mg/dL — ABNORMAL HIGH (ref 0.0–149.0)
VLDL: 65.2 mg/dL — AB (ref 0.0–40.0)

## 2015-09-22 LAB — LDL CHOLESTEROL, DIRECT: LDL DIRECT: 115 mg/dL

## 2015-09-22 LAB — HEMOGLOBIN A1C: HEMOGLOBIN A1C: 5.6 % (ref 4.6–6.5)

## 2015-09-22 NOTE — Progress Notes (Addendum)
Subjective:   [name] is a 70 y.o. female who presents for Medicare Annual (Subsequent) preventive examination.  Review of Systems:  No ROS.  Medicare Wellness Visit.     Sleep patterns: Sleeping about 4 hours nightly, waking up in early hours stressed. Pt recently moved into a new house, but has not sold old house and her mother recently died and they are trying to clean out her house as well. Counseling on sleep hygiene provided, and PCP follow-up recommended if sleep issues do not resolve as stress decreases.    Home Safety/Smoke Alarms:  Lives w/ husband in a neighborhood in a 2 story single family home. Feels safe in home. Smoke alarms and home security system in place.  Firearm Safety: No firearms. Seat Belt Safety/Bike Helmet: Wears seat belt.  Counseling:   Eye Exam- Dr. Mare Ferrari , sees eye doctor regularly, wears glasses, no issues reported Dental-Dr. Charolotte Capuchin, every 6 mos  Female:   Pap-Hysterectomy       Mammo- 04/05/15 Bi-rads Category 2: Benign; stable hypoechoic mass, unchanged for 2 years        Dexa scan- 09/21/14 Normal        CCS-04/14/13 Dr. Samuella Bruin at [place]      Objective:     Vitals: BP 134/86 mmHg  Pulse 61  Resp 16  Ht 5' 3.5" (1.613 m)  Wt 166 lb 3.2 oz (75.388 kg)  BMI 28.98 kg/m2  SpO2 98%  LMP 03/04/2002  Body mass index is 28.98 kg/(m^2).   Tobacco History  Smoking status  . Former Smoker  Smokeless tobacco  . Former Systems developer  . Quit date: 03/04/2013     Counseling given: Not Answered   Past Medical History  Diagnosis Date  . Endometrial hyperplasia   . Fibroid     Atypical leiomyoma  . Cervical dysplasia   . History of colon polyps   . Hyperglycemia 05/23/2014   Past Surgical History  Procedure Laterality Date  . Colposcopy    . Oophorectomy      BSO  . Tonsillectomy  1990  . Breast surgery  1998    Benign left breast lump  . Abdominal hysterectomy  2004    TAH_BSO  . Lymph node biopsy Left  1975    ? "removed lymph nodes in left leg"--benign per pt  . Hemorrhoid surgery  1980's   Family History  Problem Relation Age of Onset  . Hypertension Mother   . Heart disease Mother   . Diabetes Mother   . Breast cancer Mother 27  . Heart disease Father   . Cancer Father     prostate  . Cancer Sister 51    Leukemia, deceased  . Hypertension Brother   . Diabetes Brother   . Stroke Brother   . Breast cancer Maternal Aunt     over 3  . Breast cancer Maternal Grandmother     over 74   History  Sexual Activity  . Sexual Activity: Yes  . Birth Control/ Protection: Surgical    Outpatient Encounter Prescriptions as of 09/22/2015  Medication Sig  . Ascorbic Acid (VITAMIN C PO) Take by mouth.    . Calcium Carbonate-Vitamin D (CALCIUM + D PO) Take by mouth. With zinc and magnesium  . estradiol (ESTRACE) 0.5 MG tablet TAKE 1 TABLET(0.5 MG) BY MOUTH DAILY  . glucosamine-chondroitin 500-400 MG tablet Take 1 tablet by mouth daily.    . IRON PO Take 65 mg by mouth  daily.  . metoprolol (LOPRESSOR) 50 MG tablet TAKE 1 TABLET(50 MG) BY MOUTH TWICE DAILY  . Multiple Vitamin (MULTIVITAMIN) tablet Take 1 tablet by mouth daily.    . Omega-3 Fatty Acids (CVS FISH OIL) 1000 MG CAPS Take 2,000 capsules by mouth 2 (two) times daily.  Marland Kitchen OVER THE COUNTER MEDICATION Vitamin for hair and nails from [facility]  . telmisartan-hydrochlorothiazide (MICARDIS HCT) 80-25 MG tablet TAKE 1 TABLET BY MOUTH DAILY  . [DISCONTINUED] cholecalciferol (VITAMIN D) 1000 UNITS tablet Take 1,000 Units by mouth daily. Reported on 09/22/2015  . [DISCONTINUED] docusate sodium (COLACE) 100 MG capsule Take 1 capsule (100 mg total) by mouth at bedtime. (Patient not taking: Reported on 09/22/2015)   No facility-administered encounter medications on file as of 09/22/2015.    Activities of Daily Living In your present state of health, do you have any difficulty performing the following activities: 09/22/2015  Hearing? N  Vision? N    Difficulty concentrating or making decisions? N  Walking or climbing stairs? N  Dressing or bathing? N  Doing errands, shopping? N  Preparing Food and eating ? N  Using the Toilet? N  In the past six months, have you accidently leaked urine? Y  Do you have problems with loss of bowel control? N  Managing your Medications? N  Managing your Finances? N  Housekeeping or managing your Housekeeping? N    Patient Care Team: Debbrah Alar, NP as PCP - General (Internal Medicine) Juanita Craver, MD as Consulting Physician (Gastroenterology) Huel Cote, NP as Nurse Practitioner (Obstetrics and Gynecology)    Assessment:    Physical assessment deferred to PCP.  Exercise Activities and Dietary recommendations Current Exercise Habits: Home exercise routine, Type of exercise: walking, Exercise limited by: None identified  Diet (meal preparation, eat out, water intake, caffeinated beverages, dairy products, fruits and vegetables): drinks lots of water, usually eats 2 meals daily Breakfast: eggs, bacon/sausage/ham, fruit Lunch/Dinner: Prepares meals at home, enjoys fresh fruits and veggies  Goals    . Patient Stated     Decrease stress through reading, crafts, social time with friends. Increase physical activity as time allows. Maintain current state of health/no declines in health over the next year.      Fall Risk Fall Risk  09/22/2015 09/19/2014 09/19/2014  Falls in the past year? No No No   Depression Screen PHQ 2/9 Scores 09/22/2015 09/19/2014 09/19/2014  PHQ - 2 Score 1 0 0     Cognitive Testing MMSE - Mini Mental State Exam 09/22/2015  Orientation to time 5  Orientation to Place 5  Registration 3  Attention/ Calculation 5  Recall 3  Language- name 2 objects 2  Language- repeat 1  Language- follow 3 step command 3  Language- read & follow direction 1  Write a sentence 1  Copy design 1  Total score 30    Immunization History  Administered Date(s) Administered  . Td  09/19/2014  . Zoster 03/04/2012   Screening Tests Health Maintenance  Topic Date Due  . Hepatitis C Screening  1945/07/25  . PNA vac Low Risk Adult (1 of 2 - PCV13) 01/17/2013  . INFLUENZA VACCINE  05/23/2019 (Originally 10/03/2015)  . MAMMOGRAM  04/04/2017  . COLONOSCOPY  04/14/2018  . TETANUS/TDAP  09/18/2024  . DEXA SCAN  Completed  . ZOSTAVAX  Completed      Plan:   Bring a copy of your Advanced Directives to your next visit.  Continue to eat heart healthy diet (full of fruits,  vegetables, whole grains, lean protein, water--limit salt, fat, and sugar intake) and increase physical activity as tolerated.  Continue doing brain stimulating activities (puzzles, reading, adult coloring books, staying active) to keep memory sharp.   You can consider OTC melatonin to help with sleep. Try keeping a notepad and pen by the bedside to write down any tasks or concerns that wake you up at night.  Pt declined PNA vaccination today, but will consider getting it at next appointment.  Pt requested rxs be transferred to [facility]. [facility] and requested transfer from [facility].  During the course of the visit the patient was educated and counseled about the following appropriate screening and preventive services:   Vaccines to include Pneumoccal, Influenza, Hepatitis B, Td, Zostavax, HCV  Electrocardiogram  Cardiovascular Disease  Colorectal cancer screening  Bone density screening  Diabetes screening  Glaucoma screening  Mammography/PAP  Nutrition counseling   Patient Instructions (the written plan) was given to the patient.   Dorrene German, RN  09/22/2015     Reviewed     Floy Sabina, DO

## 2015-09-22 NOTE — Telephone Encounter (Signed)
Pt would like to have her lab results mailed to her.

## 2015-09-22 NOTE — Progress Notes (Signed)
Pre visit review using our clinic review tool, if applicable. No additional management support is needed unless otherwise documented below in the visit note. 

## 2015-09-22 NOTE — Patient Instructions (Signed)
Bring a copy of your Advanced Directives to your next visit.  Continue to eat heart healthy diet (full of fruits, vegetables, whole grains, lean protein, water--limit salt, fat, and sugar intake) and increase physical activity as tolerated.  Continue doing brain stimulating activities (puzzles, reading, adult coloring books, staying active) to keep memory sharp.   You can consider OTC melatonin to help with sleep. Try keeping a notepad and pen by the bedside to write down any tasks or concerns that wake you up at night.

## 2015-09-22 NOTE — Telephone Encounter (Signed)
[facility] and requested rxs be transferred to them from [place].

## 2015-09-22 NOTE — Telephone Encounter (Signed)
Pt just had labs done today. Called to inform pt that we will mail results once the results are available.

## 2015-09-25 ENCOUNTER — Telehealth: Payer: Self-pay | Admitting: Family

## 2015-09-25 ENCOUNTER — Encounter: Payer: Self-pay | Admitting: Family

## 2015-09-25 NOTE — Telephone Encounter (Signed)
Opened in error

## 2015-11-22 ENCOUNTER — Other Ambulatory Visit: Payer: Self-pay | Admitting: Family

## 2015-12-13 ENCOUNTER — Ambulatory Visit (INDEPENDENT_AMBULATORY_CARE_PROVIDER_SITE_OTHER): Payer: Medicare Other | Admitting: Family

## 2015-12-13 ENCOUNTER — Encounter: Payer: Self-pay | Admitting: Family

## 2015-12-13 VITALS — BP 162/95 | HR 61 | Temp 98.4°F | Resp 16 | Ht 67.0 in | Wt 171.0 lb

## 2015-12-13 DIAGNOSIS — H1132 Conjunctival hemorrhage, left eye: Secondary | ICD-10-CM

## 2015-12-13 DIAGNOSIS — I10 Essential (primary) hypertension: Secondary | ICD-10-CM

## 2015-12-13 MED ORDER — AMLODIPINE BESYLATE 5 MG PO TABS: 5.0000 mg | ORAL_TABLET | Freq: Every day | ORAL | 3 refills | Status: DC

## 2015-12-13 NOTE — Patient Instructions (Addendum)
Please add amlodipine 5mg  once daily. You eye should improve in the next several weeks. Please let us know if you develop pain or if you develop problems with your vision.

## 2015-12-13 NOTE — Progress Notes (Signed)
Pre visit review using our clinic review tool, if applicable. No additional management support is needed unless otherwise documented below in the visit note. 

## 2015-12-13 NOTE — Progress Notes (Signed)
Subjective:    Patient ID: [name], female    DOB: 1945-12-21, 70 y.o.   MRN: [id number]  HPI  [name] is a 70 yr old female who presents today to discuss a "broken blood vessel" in her left eye.   BP Readings from Last 3 Encounters:  12/13/15 (!) 162/95  09/22/15 134/86  04/12/15 140/82    Review of Systems     Past Medical History:  Diagnosis Date  . Cervical dysplasia   . Endometrial hyperplasia   . Fibroid    Atypical leiomyoma  . History of colon polyps   . Hyperglycemia 05/23/2014     Social History   Social History  . Marital status: Married    Spouse name: N/A  . Number of children: N/A  . Years of education: N/A   Occupational History  . Not on file.   Social History Main Topics  . Smoking status: Former Research scientist (life sciences)  . Smokeless tobacco: Former Systems developer    Quit date: 03/04/2013  . Alcohol use 0.0 oz/week     Comment: 2-4 glasses of wine on weekends  . Drug use: Unknown  . Sexual activity: Yes    Birth control/ protection: Surgical   Other Topics Concern  . 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   Enjoys reading, yard work, cares for her mother during the day, her brother helps during then night, enjoys poker   1 daughter (in [place]) and 2 grand-daughters (In [place]- age 72 and 62)    Past Surgical History:  Procedure Laterality Date  . ABDOMINAL HYSTERECTOMY  2004   TAH_BSO  . BREAST SURGERY  1998   Benign left breast lump  . COLPOSCOPY    . HEMORRHOID SURGERY  1980's  . LYMPH NODE BIOPSY Left 1975   ? "removed lymph nodes in left leg"--benign per pt  . OOPHORECTOMY     BSO  . TONSILLECTOMY  1990    Family History  Problem Relation Age of Onset  . Hypertension Mother   . Heart disease Mother   . Diabetes Mother   . Breast cancer Mother 37  . Heart disease Father   . Cancer  Father     prostate  . Cancer Sister 46    Leukemia, deceased  . Hypertension Brother   . Diabetes Brother   . Stroke Brother   . Breast cancer Maternal Aunt     over 47  . Breast cancer Maternal Grandmother     over 50    Allergies  Allergen Reactions  . Atorvastatin     Muscle cramps  . Fenofibrate     "Locked her muscles up"    Current Outpatient Prescriptions on File Prior to Visit  Medication Sig Dispense Refill  . Ascorbic Acid (VITAMIN C PO) Take by mouth.      . Calcium Carbonate-Vitamin D (CALCIUM + D PO) Take by mouth. With zinc and magnesium    . estradiol (ESTRACE) 0.5 MG tablet TAKE 1 TABLET(0.5 MG) BY MOUTH DAILY 90 tablet 3  . glucosamine-chondroitin 500-400 MG tablet Take 1 tablet by mouth daily.      . IRON PO Take 65 mg by mouth daily.    . metoprolol (LOPRESSOR) 50 MG tablet TAKE 1 TABLET BY MOUTH 2 TIMES A  DAY. 60 tablet 5  . Multiple Vitamin (MULTIVITAMIN) tablet Take 1 tablet by mouth daily.      . Omega-3 Fatty Acids (CVS FISH OIL) 1000 MG CAPS Take 2,000 capsules by mouth 2 (two) times daily. 90 capsule   . OVER THE COUNTER MEDICATION Vitamin for hair and nails from [facility]    . telmisartan-hydrochlorothiazide (MICARDIS HCT) 80-25 MG tablet TAKE 1 TABLET BY MOUTH DAILY. 30 tablet 5   No current facility-administered medications on file prior to visit.     BP (!) 162/95   Pulse 61   Temp 98.4 F (36.9 C) (Oral)   Resp 16   Ht 5\' 7"  (1.702 m)   Wt 171 lb (77.6 kg)   LMP 03/04/2002   SpO2 97% Comment: room air  BMI 26.78 kg/m    Objective:   Physical Exam  Constitutional: She appears well-developed and well-nourished.  Eyes:    Cardiovascular: Normal rate, regular rhythm and normal heart sounds.   No murmur heard. Pulmonary/Chest: Effort normal and breath sounds normal. No respiratory distress. She has no wheezes.  Psychiatric: She has a normal mood and affect. Her behavior is normal. Judgment and thought content normal.            Assessment & Plan:  Subconjunctival hemorrhage- reassurance provided that this is self limited and should improve in the next few weeks.  HTN- uncontrolled. Will add amlodipine.  Follow up in 2 weeks for a nurse visit BP check.

## 2015-12-27 ENCOUNTER — Encounter: Payer: Self-pay | Admitting: Family

## 2015-12-27 ENCOUNTER — Ambulatory Visit (INDEPENDENT_AMBULATORY_CARE_PROVIDER_SITE_OTHER): Payer: Medicare Other | Admitting: Family

## 2015-12-27 VITALS — BP 164/93 | HR 63 | Temp 98.1°F | Ht 64.5 in | Wt 169.8 lb

## 2015-12-27 DIAGNOSIS — H1132 Conjunctival hemorrhage, left eye: Secondary | ICD-10-CM | POA: Diagnosis not present

## 2015-12-27 DIAGNOSIS — I10 Essential (primary) hypertension: Secondary | ICD-10-CM

## 2015-12-27 MED ORDER — AMLODIPINE BESYLATE 10 MG PO TABS: 10.0000 mg | ORAL_TABLET | Freq: Every day | ORAL | 3 refills | Status: DC

## 2015-12-27 NOTE — Patient Instructions (Addendum)
Please increase your amlodipine from 5mg to 10mg once daily.  

## 2015-12-27 NOTE — Progress Notes (Signed)
Pre visit review using our clinic review tool, if applicable. No additional management support is needed unless otherwise documented below in the visit note. 

## 2015-12-27 NOTE — Progress Notes (Signed)
Subjective:    Patient ID: [name], female    DOB: 11-22-1945, 70 y.o.   MRN: [id number]  HPI  [name] is a 70 yr old female who presents today for follow up of her left subconjunctival hemorrhage. She reports near resolution. Denies pain or visual changes.   HTN-  current medication includes micardis HCT, metoprolol and amlodipine.  She reports good compliance with medication.  BP Readings from Last 3 Encounters:  12/27/15 (!) 164/93  12/13/15 (!) 162/95  09/22/15 134/86      Review of Systems    see HPI  Past Medical History:  Diagnosis Date  . Cervical dysplasia   . Endometrial hyperplasia   . Fibroid    Atypical leiomyoma  . History of colon polyps   . Hyperglycemia 05/23/2014     Social History   Social History  . Marital status: Married    Spouse name: N/A  . Number of children: N/A  . Years of education: N/A   Occupational History  . Not on file.   Social History Main Topics  . Smoking status: Former Research scientist (life sciences)  . Smokeless tobacco: Former Systems developer    Quit date: 03/04/2013  . Alcohol use 0.0 oz/week     Comment: 2-4 glasses of wine on weekends  . Drug use: Unknown  . Sexual activity: Yes    Birth control/ protection: Surgical   Other Topics Concern  . 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   Enjoys reading, yard work, cares for her mother during the day, her brother helps during then night, enjoys poker   1 daughter (in [place]) and 2 grand-daughters (In [place]- age 46 and 25)    Past Surgical History:  Procedure Laterality Date  . ABDOMINAL HYSTERECTOMY  2004   TAH_BSO  . BREAST SURGERY  1998   Benign left breast lump  . COLPOSCOPY    . HEMORRHOID SURGERY  1980's  . LYMPH NODE BIOPSY Left 1975   ? "removed lymph nodes in left leg"--benign per pt  . OOPHORECTOMY     BSO  .  TONSILLECTOMY  1990    Family History  Problem Relation Age of Onset  . Hypertension Mother   . Heart disease Mother   . Diabetes Mother   . Breast cancer Mother 87  . Heart disease Father   . Cancer Father     prostate  . Cancer Sister 83    Leukemia, deceased  . Hypertension Brother   . Diabetes Brother   . Stroke Brother   . Breast cancer Maternal Aunt     over 15  . Breast cancer Maternal Grandmother     over 50    Allergies  Allergen Reactions  . Atorvastatin     Muscle cramps  . Fenofibrate     "Locked her muscles up"    Current Outpatient Prescriptions on File Prior to Visit  Medication Sig Dispense Refill  . amLODipine (NORVASC) 5 MG tablet Take 1 tablet (5 mg total) by mouth daily. 30 tablet 3  . Ascorbic Acid (VITAMIN C PO) Take by mouth.      . Calcium Carbonate-Vitamin D (CALCIUM + D PO) Take by mouth. With zinc and magnesium    . estradiol (ESTRACE) 0.5 MG tablet TAKE 1 TABLET(0.5 MG)  BY MOUTH DAILY 90 tablet 3  . glucosamine-chondroitin 500-400 MG tablet Take 1 tablet by mouth daily.      . IRON PO Take 65 mg by mouth daily.    . metoprolol (LOPRESSOR) 50 MG tablet TAKE 1 TABLET BY MOUTH 2 TIMES A DAY. 60 tablet 5  . Multiple Vitamin (MULTIVITAMIN) tablet Take 1 tablet by mouth daily.      . Omega-3 Fatty Acids ([employer] FISH OIL) 1000 MG CAPS Take 2,000 capsules by mouth 2 (two) times daily. 90 capsule   . OVER THE COUNTER MEDICATION Vitamin for hair and nails from [facility]    . telmisartan-hydrochlorothiazide (MICARDIS HCT) 80-25 MG tablet TAKE 1 TABLET BY MOUTH DAILY. 30 tablet 5   No current facility-administered medications on file prior to visit.     BP (!) 164/93 (BP Location: Left Arm, Patient Position: Sitting, Cuff Size: Normal)   Pulse 63   Temp 98.1 F (36.7 C) (Oral)   Ht 5' 4.5" (1.638 m)   Wt 169 lb 12.8 oz (77 kg)   LMP 03/04/2002   SpO2 100%   BMI 28.70 kg/m    Objective:   Physical Exam  Constitutional: She is oriented to person,  place, and time. She appears well-developed and well-nourished.  HENT:  Head: Normocephalic and atraumatic.  Eyes:  Slightly brown discoloration of the left sclera, no longer red.   Cardiovascular: Normal rate, regular rhythm and normal heart sounds.   No murmur heard. Pulmonary/Chest: Effort normal and breath sounds normal. No respiratory distress. She has no wheezes.  Neurological: She is alert and oriented to person, place, and time.  Skin: Skin is warm and dry.  Psychiatric: She has a normal mood and affect. Her behavior is normal. Judgment and thought content normal.          Assessment & Plan:  Subconjunctival hemorrhage left eye- resolve.

## 2015-12-30 NOTE — Assessment & Plan Note (Signed)
Uncontrolled.  Increase amlodipine from 5mg  to 10mg . Continue micardis hct and metoprolol at current doses.

## 2015-12-30 NOTE — Assessment & Plan Note (Signed)
>>  ASSESSMENT AND PLAN FOR ESSENTIAL HYPERTENSION, BENIGN WRITTEN ON 12/30/2015 10:53 PM BY O'SULLIVAN, Roxas Clymer, NP  Uncontrolled.  Increase amlodipine from 5mg  to 10mg . Continue micardis hct and metoprolol at current doses.

## 2016-01-31 ENCOUNTER — Ambulatory Visit (INDEPENDENT_AMBULATORY_CARE_PROVIDER_SITE_OTHER): Payer: Medicare Other | Admitting: Behavioral Health

## 2016-01-31 VITALS — BP 168/79 | HR 67

## 2016-01-31 DIAGNOSIS — I10 Essential (primary) hypertension: Secondary | ICD-10-CM

## 2016-01-31 MED ORDER — CLONIDINE HCL 0.1 MG PO TABS: 0.1000 mg | ORAL_TABLET | Freq: Two times a day (BID) | ORAL | 0 refills | Status: DC

## 2016-01-31 NOTE — Patient Instructions (Signed)
Per Inda Castle, NP: Start Clonidine 0.1 MG twice a day. Continue current medications & regimen. Follow-up in 1 week for a nurse visit to have blood pressure rechecked.

## 2016-01-31 NOTE — Progress Notes (Signed)
Pre visit review using our clinic review tool, if applicable. No additional management support is needed unless otherwise documented below in the visit note.  Patient came in office for blood pressure check per OV note 12/27/15. Medications were reviewed with the patient. Today's readings were as follow: BP 162/86 P 65 & BP 168/79 P 67. Patient is asymptomatic; denies headache, burred vision, dizziness and etc.  Per Inda Castle, NP: Start Clonidine 0.1 MG twice a day. Continue current medications & regimen. Follow-up in 1 week for a nurse visit to have blood pressure rechecked.  Informed patient of the provider's instructions. She voiced understanding and did not have any further concerns before leaving the visit.  Next appointment scheduled for 02/07/16 at 10:00 AM.

## 2016-02-01 NOTE — Progress Notes (Signed)
Noted and agree. 

## 2016-03-11 ENCOUNTER — Other Ambulatory Visit: Payer: Self-pay | Admitting: Women's Health

## 2016-03-11 DIAGNOSIS — Z1231 Encounter for screening mammogram for malignant neoplasm of breast: Secondary | ICD-10-CM

## 2016-03-25 ENCOUNTER — Ambulatory Visit: Payer: Medicare Other | Admitting: Family

## 2016-03-27 ENCOUNTER — Ambulatory Visit: Payer: Medicare Other | Admitting: Family

## 2016-04-02 ENCOUNTER — Ambulatory Visit (INDEPENDENT_AMBULATORY_CARE_PROVIDER_SITE_OTHER): Payer: Medicare Other | Admitting: Family

## 2016-04-02 ENCOUNTER — Encounter: Payer: Self-pay | Admitting: Family

## 2016-04-02 VITALS — BP 159/98 | HR 70 | Temp 98.4°F | Ht 64.5 in | Wt 172.4 lb

## 2016-04-02 DIAGNOSIS — I10 Essential (primary) hypertension: Secondary | ICD-10-CM

## 2016-04-02 DIAGNOSIS — R739 Hyperglycemia, unspecified: Secondary | ICD-10-CM

## 2016-04-02 MED ORDER — CHLORTHALIDONE 25 MG PO TABS
25.0000 mg | ORAL_TABLET | Freq: Every day | ORAL | 2 refills | Status: DC
Start: 2016-04-02 — End: 2016-05-07

## 2016-04-02 MED ORDER — TELMISARTAN 80 MG PO TABS: 80.0000 mg | ORAL_TABLET | Freq: Every day | ORAL | 2 refills | Status: DC

## 2016-04-02 MED ORDER — METOPROLOL TARTRATE 50 MG PO TABS: 50.0000 mg | ORAL_TABLET | Freq: Three times a day (TID) | ORAL | 2 refills | Status: DC

## 2016-04-02 NOTE — Progress Notes (Signed)
Subjective:    Patient ID: [name], female    DOB: 1945-09-12, 71 y.o.   MRN: [id number]  HPI  [name] is a 71 yr old female who presents today for follow up.  She is maintained on micardis and metoprolol. Reports that clonidine and amlodipine caused dizziness. m BP Readings from Last 3 Encounters:  04/02/16 (!) 159/98  01/31/16 (!) 168/79  12/27/15 (!) 164/93     Review of Systems Past Medical History:  Diagnosis Date  . Cervical dysplasia   . Endometrial hyperplasia   . Fibroid    Atypical leiomyoma  . History of colon polyps   . Hyperglycemia 05/23/2014     Social History   Social History  . Marital status: Married    Spouse name: N/A  . Number of children: N/A  . Years of education: N/A   Occupational History  . Not on file.   Social History Main Topics  . Smoking status: Former Research scientist (life sciences)  . Smokeless tobacco: Former Systems developer    Quit date: 03/04/2013  . Alcohol use 0.0 oz/week     Comment: 2-4 glasses of wine on weekends  . Drug use: Unknown  . Sexual activity: Yes    Birth control/ protection: Surgical   Other Topics Concern  . 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   Enjoys reading, yard work, cares for her mother during the day, her brother helps during then night, enjoys poker   1 daughter (in [place]) and 2 grand-daughters (In [place]- age 38 and 55)    Past Surgical History:  Procedure Laterality Date  . ABDOMINAL HYSTERECTOMY  2004   TAH_BSO  . BREAST SURGERY  1998   Benign left breast lump  . COLPOSCOPY    . HEMORRHOID SURGERY  1980's  . LYMPH NODE BIOPSY Left 1975   ? "removed lymph nodes in left leg"--benign per pt  . OOPHORECTOMY     BSO  . TONSILLECTOMY  1990    Family History  Problem Relation Age of Onset  . Hypertension Mother   . Heart disease Mother   . Diabetes Mother    . Breast cancer Mother 15  . Heart disease Father   . Cancer Father     prostate  . Cancer Sister 13    Leukemia, deceased  . Hypertension Brother   . Diabetes Brother   . Stroke Brother   . Breast cancer Maternal Aunt     over 66  . Breast cancer Maternal Grandmother     over 50    Allergies  Allergen Reactions  . Atorvastatin     Muscle cramps  . Fenofibrate     "Locked her muscles up"    Current Outpatient Prescriptions on File Prior to Visit  Medication Sig Dispense Refill  . Ascorbic Acid (VITAMIN C PO) Take by mouth.      . Calcium Carbonate-Vitamin D (CALCIUM + D PO) Take by mouth. With zinc and magnesium    . estradiol (ESTRACE) 0.5 MG tablet TAKE 1 TABLET(0.5 MG) BY MOUTH DAILY 90 tablet 3  . glucosamine-chondroitin 500-400 MG tablet Take 1 tablet by mouth daily.      . IRON PO Take 65 mg by mouth daily.    . metoprolol (LOPRESSOR) 50 MG tablet TAKE 1  TABLET BY MOUTH 2 TIMES A DAY. 60 tablet 5  . Multiple Vitamin (MULTIVITAMIN) tablet Take 1 tablet by mouth daily.      . Omega-3 Fatty Acids ([employer] FISH OIL) 1000 MG CAPS Take 2,000 capsules by mouth 2 (two) times daily. 90 capsule   . OVER THE COUNTER MEDICATION Vitamin for hair and nails from [facility]    . telmisartan-hydrochlorothiazide (MICARDIS HCT) 80-25 MG tablet TAKE 1 TABLET BY MOUTH DAILY. 30 tablet 5   No current facility-administered medications on file prior to visit.     BP (!) 159/98 (BP Location: Right Arm, Cuff Size: Large)   Pulse 70   Temp 98.4 F (36.9 C) (Oral)   Ht 5' 4.5" (1.638 m)   Wt 172 lb 6.4 oz (78.2 kg)   LMP 03/04/2002   SpO2 97%   BMI 29.14 kg/m       Objective:   Physical Exam  Constitutional: She appears well-developed and well-nourished.  Cardiovascular: Normal rate, regular rhythm and normal heart sounds.   No murmur heard. Pulmonary/Chest: Effort normal and breath sounds normal. No respiratory distress. She has no wheezes.  Psychiatric: She has a normal mood and affect.  Her behavior is normal. Judgment and thought content normal.          Assessment & Plan:  HTN- uncontrolled. Manual repeat 180/102. Case discussed with Dr. Charlett Blake. Advised pt as follows:  Stop micardis HCT.  Instead please begin telmisartan and chlorthalidone. Increase metoprolol 50mg  to 3 times daily.

## 2016-04-02 NOTE — Patient Instructions (Signed)
Stop micardis HCT.  Instead please begin telmisartan and chlorthalidone. Increase metoprolol 50mg  to 3 times daily.

## 2016-04-02 NOTE — Progress Notes (Signed)
Pre visit review using our clinic review tool, if applicable. No additional management support is needed unless otherwise documented below in the visit note. 

## 2016-04-12 ENCOUNTER — Encounter: Payer: Medicare Other | Admitting: Women's Health

## 2016-04-16 ENCOUNTER — Ambulatory Visit
Admission: RE | Admit: 2016-04-16 | Discharge: 2016-04-16 | Disposition: A | Discharge status: Home / Self Care | Payer: Medicare Other | Source: Ambulatory Visit | Attending: Women's Health | Admitting: Women's Health

## 2016-04-16 DIAGNOSIS — Z1231 Encounter for screening mammogram for malignant neoplasm of breast: Secondary | ICD-10-CM

## 2016-04-23 ENCOUNTER — Other Ambulatory Visit (INDEPENDENT_AMBULATORY_CARE_PROVIDER_SITE_OTHER): Payer: Medicare Other

## 2016-04-23 ENCOUNTER — Ambulatory Visit: Payer: Medicare Other | Admitting: Family

## 2016-04-23 VITALS — BP 157/100 | HR 64

## 2016-04-23 DIAGNOSIS — I10 Essential (primary) hypertension: Secondary | ICD-10-CM | POA: Diagnosis not present

## 2016-04-23 LAB — BASIC METABOLIC PANEL
BUN: 10 mg/dL (ref 6–23)
CHLORIDE: 97 meq/L (ref 96–112)
CO2: 31 mEq/L (ref 19–32)
Calcium: 9.7 mg/dL (ref 8.4–10.5)
Creatinine, Ser: 0.54 mg/dL (ref 0.40–1.20)
GFR: 119.23 mL/min (ref >60.00)
GLUCOSE: 116 mg/dL — AB (ref 70–99)
POTASSIUM: 3.4 meq/L — AB (ref 3.5–5.1)
SODIUM: 135 meq/L (ref 135–145)

## 2016-04-23 MED ORDER — HYDRALAZINE HCL 25 MG PO TABS: 25.0000 mg | ORAL_TABLET | Freq: Three times a day (TID) | ORAL | 2 refills | Status: DC

## 2016-04-23 NOTE — Progress Notes (Signed)
Pre visit review using our clinic review tool, if applicable. No additional management support is needed unless otherwise documented below in the visit note.  Patient came in office today to have blood pressure rechecked per OV note 04/02/16. She reported adherence to current medications & regimen. During visit, the readings were BP 158/99 P 65 & BP 157/100 P 64. Patient is asymptomatic.   Per Inda Castle, NP: START Hydralazine 25 MG three times daily. Continue all other medications & regimen. Complete BMP (Basic Metabolic Panel) lab work today. Return in 2 weeks for an office visit with PCP.  Patient was made aware of the provider's instructions and voiced understanding. Next appointment scheduled for 05/07/16 at 11:45 AM.

## 2016-04-23 NOTE — Progress Notes (Signed)
Noted and agree. 

## 2016-04-23 NOTE — Patient Instructions (Signed)
Per Inda Castle, NP: START Hydralazine 25 MG three times daily. Continue all other medications & regimen. Complete BMP (Basic Metabolic Panel) lab work today. Return in 2 weeks for an office visit with PCP.

## 2016-04-25 ENCOUNTER — Other Ambulatory Visit: Payer: Self-pay | Admitting: Family

## 2016-04-25 NOTE — Telephone Encounter (Signed)
k is low.  Add kdur 20 meq once daily repeat bmet in 1 week.

## 2016-04-26 NOTE — Telephone Encounter (Signed)
Left message for pt to return my call.

## 2016-04-29 MED ORDER — POTASSIUM CHLORIDE CRYS ER 20 MEQ PO TBCR: 20.0000 meq | EXTENDED_RELEASE_TABLET | Freq: Every day | ORAL | 3 refills | Status: DC

## 2016-04-29 NOTE — Telephone Encounter (Signed)
Notified pt and she voices understanding. Rx sent to pharmacy. Pt has follow up with PCP on 05/07/16 and will recheck potassium at that visit.

## 2016-04-30 ENCOUNTER — Encounter: Payer: Self-pay | Admitting: Women's Health

## 2016-04-30 ENCOUNTER — Ambulatory Visit (INDEPENDENT_AMBULATORY_CARE_PROVIDER_SITE_OTHER): Payer: Medicare Other | Admitting: Women's Health

## 2016-04-30 ENCOUNTER — Telehealth: Payer: Self-pay | Admitting: *Deleted

## 2016-04-30 VITALS — BP 132/86 | Ht 63.75 in | Wt 169.0 lb

## 2016-04-30 DIAGNOSIS — Z79890 Hormone replacement therapy (postmenopausal): Secondary | ICD-10-CM | POA: Diagnosis not present

## 2016-04-30 DIAGNOSIS — Z01419 Encounter for gynecological examination (general) (routine) without abnormal findings: Secondary | ICD-10-CM | POA: Diagnosis not present

## 2016-04-30 MED ORDER — ESTRADIOL 0.5 MG PO TABS: ORAL_TABLET | ORAL | 3 refills | Status: DC

## 2016-04-30 NOTE — Telephone Encounter (Signed)
-----   Message from Huel Cote, NP sent at 04/30/2016 10:33 AM EST ----- Needs surgical consult for new onset soft growth/questionable lipoma of rt upper thigh 8cm.  Reports increased in size over last mo, non tender, no erythema, no known injury. Anytime ok

## 2016-04-30 NOTE — Progress Notes (Signed)
[name] February 06, 1946 [id number]    History:    Presents for breast and pelvic exam. 2014 TAH with BSO for fibroids on Estrace has occasional hot flushes, unbearable when off hrt. Hypertension, hypercholesterolemia managed by primary care. Normal Pap and mammogram history. 2015 benign colon polyp, 5 year follow-up. Has had Zostavax, not Pneumovax. 2016 normal DEXA. Reports questionable growth on right upper thigh nontender has increased in size over the past month.  Past medical history, past surgical history, family history and social history were all reviewed and documented in the EPIC chart. Increased stress selling deceased  mothers home/settling mother's estate.  ROS:  A ROS was performed and pertinent positives and negatives are included.  Exam:  Vitals:   04/30/16 0935  BP: 132/86  Weight: 169 lb (76.7 kg)  Height: 5' 3.75" (1.619 m)   Body mass index is 29.24 kg/m.   General appearance:  Normal Thyroid:  Symmetrical, normal in size, without palpable masses or nodularity. Respiratory  Auscultation:  Clear without wheezing or rhonchi Cardiovascular  Auscultation:  Regular rate, without rubs, murmurs or gallops  Edema/varicosities:  Not grossly evident Abdominal  Soft,nontender, without masses, guarding or rebound.  Liver/spleen:  No organomegaly noted  Hernia:  None appreciated  Skin  Inspection:  Grossly normal Right upper thigh 8 cm soft mobile non tender mass/ questionable lipoma   Breasts: Examined lying and sitting.     Right: Without masses, retractions, discharge or axillary adenopathy.     Left: Without masses, retractions, discharge or axillary adenopathy. Gentitourinary   Inguinal/mons:  Normal without inguinal adenopathy  External genitalia:  Normal  BUS/Urethra/Skene's glands:  Normal  Vagina:  Atrophic  Cervix:  And uterus absent Adnexa/parametria:     Rt: Without masses or tenderness.   Lt: Without masses or tenderness.  Anus and  perineum: Normal  Digital rectal exam: Normal sphincter tone without palpated masses or tenderness  Assessment/Plan:  71 y.o. MWF G1 P1  for breast and pelvic exam.  2004 TAH with BSO for fibroids on Estrace Hypertension/hypercholesterolemia-primary care manages labs and meds New onset  right upper thigh mass/questionable lipoma.  Plan: Surgical consult for possible biopsy of right upper thigh mass, will schedule. SBE's, continue annual mammogram, calcium rich diet, vitamin D 2000 daily encouraged. Home safety, fall prevention and importance of weightbearing exercise reviewed. Recommended Pneumovax encouraged to get at next annual exam with primary care. Estrace 0.5 mg by mouth daily, reviewed risks of blood clots, strokes and breast cancer, encouraged to cut in half to take 0.25 daily will call if problems with the change. Reviewed women's health initiative and best to take less or none.Marland Kitchen    Huel Cote [facility], 10:37 AM 04/30/2016

## 2016-04-30 NOTE — Telephone Encounter (Signed)
Office notes faxed to [facility] they will fax me back with time and date.

## 2016-04-30 NOTE — Patient Instructions (Signed)
Health Maintenance for Postmenopausal Women Menopause is a normal process in which your reproductive ability comes to an end. This process happens gradually over a span of months to years, usually between the ages of 33 and 38. Menopause is complete when you have missed 12 consecutive menstrual periods. It is important to talk with your health care provider about some of the most common conditions that affect postmenopausal women, such as heart disease, cancer, and bone loss (osteoporosis). Adopting a healthy lifestyle and getting preventive care can help to promote your health and wellness. Those actions can also lower your chances of developing some of these common conditions. What should I know about menopause? During menopause, you may experience a number of symptoms, such as:  Moderate-to-severe hot flashes.  Night sweats.  Decrease in sex drive.  Mood swings.  Headaches.  Tiredness.  Irritability.  Memory problems.  Insomnia. Choosing to treat or not to treat menopausal changes is an individual decision that you make with your health care provider. What should I know about hormone replacement therapy and supplements? Hormone therapy products are effective for treating symptoms that are associated with menopause, such as hot flashes and night sweats. Hormone replacement carries certain risks, especially as you become older. If you are thinking about using estrogen or estrogen with progestin treatments, discuss the benefits and risks with your health care provider. What should I know about heart disease and stroke? Heart disease, heart attack, and stroke become more likely as you age. This may be due, in part, to the hormonal changes that your body experiences during menopause. These can affect how your body processes dietary fats, triglycerides, and cholesterol. Heart attack and stroke are both medical emergencies. There are many things that you can do to help prevent heart disease  and stroke:  Have your blood pressure checked at least every 1-2 years. High blood pressure causes heart disease and increases the risk of stroke.  If you are 48-61 years old, ask your health care provider if you should take aspirin to prevent a heart attack or a stroke.  Do not use any tobacco products, including cigarettes, chewing tobacco, or electronic cigarettes. If you need help quitting, ask your health care provider.  It is important to eat a healthy diet and maintain a healthy weight.  Be sure to include plenty of vegetables, fruits, low-fat dairy products, and lean protein.  Avoid eating foods that are high in solid fats, added sugars, or salt (sodium).  Get regular exercise. This is one of the most important things that you can do for your health.  Try to exercise for at least 150 minutes each week. The type of exercise that you do should increase your heart rate and make you sweat. This is known as moderate-intensity exercise.  Try to do strengthening exercises at least twice each week. Do these in addition to the moderate-intensity exercise.  Know your numbers.Ask your health care provider to check your cholesterol and your blood glucose. Continue to have your blood tested as directed by your health care provider. What should I know about cancer screening? There are several types of cancer. Take the following steps to reduce your risk and to catch any cancer development as early as possible. Breast Cancer  Practice breast self-awareness.  This means understanding how your breasts normally appear and feel.  It also means doing regular breast self-exams. Let your health care provider know about any changes, no matter how small.  If you are 40 or older,  have a clinician do a breast exam (clinical breast exam or CBE) every year. Depending on your age, family history, and medical history, it may be recommended that you also have a yearly breast X-ray (mammogram).  If you  have a family history of breast cancer, talk with your health care provider about genetic screening.  If you are at high risk for breast cancer, talk with your health care provider about having an MRI and a mammogram every year.  Breast cancer (BRCA) gene test is recommended for women who have family members with BRCA-related cancers. Results of the assessment will determine the need for genetic counseling and BRCA1 and for BRCA2 testing. BRCA-related cancers include these types:  Breast. This occurs in males or females.  Ovarian.  Tubal. This may also be called fallopian tube cancer.  Cancer of the abdominal or pelvic lining (peritoneal cancer).  Prostate.  Pancreatic. Cervical, Uterine, and Ovarian Cancer  Your health care provider may recommend that you be screened regularly for cancer of the pelvic organs. These include your ovaries, uterus, and vagina. This screening involves a pelvic exam, which includes checking for microscopic changes to the surface of your cervix (Pap test).  For women ages 21-65, health care providers may recommend a pelvic exam and a Pap test every three years. For women ages 23-65, they may recommend the Pap test and pelvic exam, combined with testing for human papilloma virus (HPV), every five years. Some types of HPV increase your risk of cervical cancer. Testing for HPV may also be done on women of any age who have unclear Pap test results.  Other health care providers may not recommend any screening for nonpregnant women who are considered low risk for pelvic cancer and have no symptoms. Ask your health care provider if a screening pelvic exam is right for you.  If you have had past treatment for cervical cancer or a condition that could lead to cancer, you need Pap tests and screening for cancer for at least 20 years after your treatment. If Pap tests have been discontinued for you, your risk factors (such as having a new sexual partner) need to be reassessed  to determine if you should start having screenings again. Some women have medical problems that increase the chance of getting cervical cancer. In these cases, your health care provider may recommend that you have screening and Pap tests more often.  If you have a family history of uterine cancer or ovarian cancer, talk with your health care provider about genetic screening.  If you have vaginal bleeding after reaching menopause, tell your health care provider.  There are currently no reliable tests available to screen for ovarian cancer. Lung Cancer  Lung cancer screening is recommended for adults 99-83 years old who are at high risk for lung cancer because of a history of smoking. A yearly low-dose CT scan of the lungs is recommended if you:  Currently smoke.  Have a history of at least 30 pack-years of smoking and you currently smoke or have quit within the past 15 years. A pack-year is smoking an average of one pack of cigarettes per day for one year. Yearly screening should:  Continue until it has been 15 years since you quit.  Stop if you develop a health problem that would prevent you from having lung cancer treatment. Colorectal Cancer  This type of cancer can be detected and can often be prevented.  Routine colorectal cancer screening usually begins at age 72 and continues  through age 75.  If you have risk factors for colon cancer, your health care provider may recommend that you be screened at an earlier age.  If you have a family history of colorectal cancer, talk with your health care provider about genetic screening.  Your health care provider may also recommend using home test kits to check for hidden blood in your stool.  A small camera at the end of a tube can be used to examine your colon directly (sigmoidoscopy or colonoscopy). This is done to check for the earliest forms of colorectal cancer.  Direct examination of the colon should be repeated every 5-10 years until  age 75. However, if early forms of precancerous polyps or small growths are found or if you have a family history or genetic risk for colorectal cancer, you may need to be screened more often. Skin Cancer  Check your skin from head to toe regularly.  Monitor any moles. Be sure to tell your health care provider:  About any new moles or changes in moles, especially if there is a change in a mole's shape or color.  If you have a mole that is larger than the size of a pencil eraser.  If any of your family members has a history of skin cancer, especially at a Atsushi Yom age, talk with your health care provider about genetic screening.  Always use sunscreen. Apply sunscreen liberally and repeatedly throughout the day.  Whenever you are outside, protect yourself by wearing long sleeves, pants, a wide-brimmed hat, and sunglasses. What should I know about osteoporosis? Osteoporosis is a condition in which bone destruction happens more quickly than new bone creation. After menopause, you may be at an increased risk for osteoporosis. To help prevent osteoporosis or the bone fractures that can happen because of osteoporosis, the following is recommended:  If you are 19-50 years old, get at least 1,000 mg of calcium and at least 600 mg of vitamin D per day.  If you are older than age 50 but younger than age 70, get at least 1,200 mg of calcium and at least 600 mg of vitamin D per day.  If you are older than age 70, get at least 1,200 mg of calcium and at least 800 mg of vitamin D per day. Smoking and excessive alcohol intake increase the risk of osteoporosis. Eat foods that are rich in calcium and vitamin D, and do weight-bearing exercises several times each week as directed by your health care provider. What should I know about how menopause affects my mental health? Depression may occur at any age, but it is more common as you become older. Common symptoms of depression include:  Low or sad  mood.  Changes in sleep patterns.  Changes in appetite or eating patterns.  Feeling an overall lack of motivation or enjoyment of activities that you previously enjoyed.  Frequent crying spells. Talk with your health care provider if you think that you are experiencing depression. What should I know about immunizations? It is important that you get and maintain your immunizations. These include:  Tetanus, diphtheria, and pertussis (Tdap) booster vaccine.  Influenza every year before the flu season begins.  Pneumonia vaccine.  Shingles vaccine. Your health care provider may also recommend other immunizations. This information is not intended to replace advice given to you by your health care provider. Make sure you discuss any questions you have with your health care provider. Document Released: 04/12/2005 Document Revised: 09/08/2015 Document Reviewed: 11/22/2014 Elsevier Interactive Patient   Education  2017 [employer]  

## 2016-05-01 NOTE — Telephone Encounter (Signed)
Pt scheduled on 05/13/16 @ 10:30am with Dr.Cornett CCS informed pt of time and date.

## 2016-05-03 NOTE — Telephone Encounter (Signed)
Patient returning Jennifer's call to let her know that she is aware of her scheduled appt at [facility] on 3/12 at 10:30am.

## 2016-05-07 ENCOUNTER — Ambulatory Visit (INDEPENDENT_AMBULATORY_CARE_PROVIDER_SITE_OTHER): Payer: Medicare Other | Admitting: Family

## 2016-05-07 ENCOUNTER — Encounter: Payer: Self-pay | Admitting: Family

## 2016-05-07 VITALS — BP 164/98 | HR 67 | Temp 97.9°F | Resp 16 | Ht 64.0 in | Wt 170.0 lb

## 2016-05-07 DIAGNOSIS — I10 Essential (primary) hypertension: Secondary | ICD-10-CM

## 2016-05-07 DIAGNOSIS — E876 Hypokalemia: Secondary | ICD-10-CM

## 2016-05-07 DIAGNOSIS — R739 Hyperglycemia, unspecified: Secondary | ICD-10-CM

## 2016-05-07 LAB — BASIC METABOLIC PANEL
BUN: 12 mg/dL (ref 6–23)
CHLORIDE: 95 meq/L — AB (ref 96–112)
CO2: 33 meq/L — AB (ref 19–32)
Calcium: 10 mg/dL (ref 8.4–10.5)
Creatinine, Ser: 0.66 mg/dL (ref 0.40–1.20)
GFR: 94.57 mL/min (ref >60.00)
Glucose, Bld: 109 mg/dL — ABNORMAL HIGH (ref 70–99)
POTASSIUM: 3.6 meq/L (ref 3.5–5.1)
Sodium: 137 mEq/L (ref 135–145)

## 2016-05-07 LAB — HEMOGLOBIN A1C: HEMOGLOBIN A1C: 5.9 % (ref 4.6–6.5)

## 2016-05-07 MED ORDER — TELMISARTAN-HCTZ 80-25 MG PO TABS: 1.0000 | ORAL_TABLET | Freq: Every day | ORAL | 5 refills | Status: DC

## 2016-05-07 NOTE — Progress Notes (Signed)
Pre visit review using our clinic review tool, if applicable. No additional management support is needed unless otherwise documented below in the visit note. 

## 2016-05-07 NOTE — Patient Instructions (Addendum)
Please check BP once daily and call us on Friday with your readings.  Complete lab work prior to leaving.

## 2016-05-07 NOTE — Assessment & Plan Note (Signed)
>>  ASSESSMENT AND PLAN FOR ESSENTIAL HYPERTENSION, BENIGN WRITTEN ON 05/07/2016 12:13 PM BY O'SULLIVAN, Lakaya Tolen, NP  BP is elevated today but was much better yesterday while at the GYN. She will check her BP daily for the next few days at home and call us Friday with her readings. If elevated at home will adjust her meds further.

## 2016-05-07 NOTE — Assessment & Plan Note (Signed)
BP is elevated today but was much better yesterday while at the GYN. She will check her BP daily for the next few days at home and call us Friday with her readings. If elevated at home will adjust her meds further.

## 2016-05-07 NOTE — Progress Notes (Signed)
Subjective:    Patient ID: [name], female    DOB: 1945-09-05, 72 y.o.   MRN: [id number]  HPI  Ms. Grunder is a 71 yr old female who presents today for follow up.  Last visit BP was noted to be above goal. We added hydralazine 25mg  TID. She reports that she was intolerant to the hydralazine due to "burning" in her "kidneys."  Stopped hydralazine, stopped chlorthalidone. Symptoms resolved. She continued her metoprolol and restarted micardis hct. denies CP/SOB/swelling.    BP Readings from Last 3 Encounters:  05/07/16 (!) 164/98  04/30/16 132/86  04/23/16 (!) 157/100     Review of Systems    see HPI  Past Medical History:  Diagnosis Date  . Cervical dysplasia   . Endometrial hyperplasia   . Fibroid    Atypical leiomyoma  . History of colon polyps   . Hyperglycemia 05/23/2014     Social History   Social History  . Marital status: Married    Spouse name: N/A  . Number of children: N/A  . Years of education: N/A   Occupational History  . Not on file.   Social History Main Topics  . Smoking status: Former Research scientist (life sciences)  . Smokeless tobacco: Former Systems developer    Quit date: 03/04/2013  . Alcohol use 0.0 oz/week     Comment: 2-4 glasses of wine on weekends  . Drug use: Unknown  . Sexual activity: Yes    Birth control/ protection: Surgical   Other Topics Concern  . 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   Enjoys reading, yard work, cares for her mother during the day, her brother helps during then night, enjoys poker   1 daughter (in [place]) and 2 grand-daughters (In [place]- age 40 and 63)    Past Surgical History:  Procedure Laterality Date  . ABDOMINAL HYSTERECTOMY  2004   TAH_BSO  . BREAST BIOPSY Left 2000  . BREAST BIOPSY Bilateral 1999  . BREAST SURGERY  1998   Benign left breast lump  . COLPOSCOPY    .  HEMORRHOID SURGERY  1980's  . LYMPH NODE BIOPSY Left 1975   ? "removed lymph nodes in left leg"--benign per pt  . OOPHORECTOMY     BSO  . TONSILLECTOMY  1990    Family History  Problem Relation Age of Onset  . Hypertension Mother   . Heart disease Mother   . Diabetes Mother   . Breast cancer Mother 9  . Heart disease Father   . Cancer Father     prostate  . Cancer Sister 54    Leukemia, deceased  . Breast cancer Maternal Aunt     over 4  . Breast cancer Maternal Grandmother     over 45  . Hypertension Brother   . Diabetes Brother   . Stroke Brother     Allergies  Allergen Reactions  . Atorvastatin     Muscle cramps  . Fenofibrate     "Locked her muscles up"    Current Outpatient Prescriptions on File Prior to Visit  Medication Sig Dispense Refill  . Ascorbic Acid (VITAMIN C PO) Take by mouth.      . Calcium Carbonate-Vitamin D (CALCIUM + D PO) Take by mouth. With zinc and magnesium    . estradiol (  ESTRACE) 0.5 MG tablet TAKE 1 TABLET(0.5 MG) BY MOUTH DAILY 90 tablet 3  . glucosamine-chondroitin 500-400 MG tablet Take 1 tablet by mouth daily.      . metoprolol (LOPRESSOR) 50 MG tablet Take 1 tablet (50 mg total) by mouth 3 (three) times daily. 90 tablet 2  . Multiple Vitamin (MULTIVITAMIN) tablet Take 1 tablet by mouth daily.      . Omega-3 Fatty Acids ([facility] FISH OIL) 1000 MG CAPS Take 2,000 capsules by mouth 2 (two) times daily. 90 capsule   . OVER THE COUNTER MEDICATION Vitamin for hair and nails from [facility]    . telmisartan (MICARDIS) 80 MG tablet Take 1 tablet (80 mg total) by mouth daily. 30 tablet 2  . chlorthalidone (HYGROTON) 25 MG tablet Take 1 tablet (25 mg total) by mouth daily. (Patient not taking: Reported on 05/07/2016) 30 tablet 2  . hydrALAZINE (APRESOLINE) 25 MG tablet Take 1 tablet (25 mg total) by mouth 3 (three) times daily. (Patient not taking: Reported on 05/07/2016) 30 tablet 2  . potassium chloride SA (K-DUR,KLOR-CON) 20 MEQ tablet Take 1 tablet (20  mEq total) by mouth daily. (Patient not taking: Reported on 05/07/2016) 30 tablet 3   No current facility-administered medications on file prior to visit.     BP (!) 164/98 (BP Location: Left Arm, Patient Position: Sitting, Cuff Size: Normal)   Pulse 67   Temp 97.9 F (36.6 C) (Oral)   Resp 16   Ht 5\' 4"  (1.626 m)   Wt 170 lb (77.1 kg)   LMP 03/04/2002   SpO2 100%   BMI 29.18 kg/m    Objective:   Physical Exam  Constitutional: She is oriented to person, place, and time. She appears well-developed and well-nourished.  HENT:  Head: Normocephalic and atraumatic.  Cardiovascular: Normal rate, regular rhythm and normal heart sounds.   No murmur heard. Pulmonary/Chest: Effort normal and breath sounds normal. No respiratory distress. She has no wheezes.  Musculoskeletal: She exhibits no edema.  Neurological: She is alert and oriented to person, place, and time.  Psychiatric: She has a normal mood and affect. Her behavior is normal. Judgment and thought content normal.          Assessment & Plan:  Hypokalemia- will check follow up BMET today now that she is off of [place].

## 2016-05-08 ENCOUNTER — Encounter: Payer: Self-pay | Admitting: *Deleted

## 2016-05-10 ENCOUNTER — Telehealth: Payer: Self-pay | Admitting: Family

## 2016-05-10 NOTE — Telephone Encounter (Signed)
Caller name: Relationship to patient: Self Can be reached: [phone]  Pharmacy:  Reason for call: Patient called to report BP readings: Wednesday 3/7: 136/78  Thursday: 139/82 Today: 134/73

## 2016-05-10 NOTE — Telephone Encounter (Signed)
Pt called in to give her BP readings for provide

## 2016-05-10 NOTE — Telephone Encounter (Signed)
Home BP readings look good. Continue to check blood pressure a few times a week.  Let me know if consistently > 140/90. Otherwise, follow up in 3 months and bring home meter to her appointment so we can compare.

## 2016-05-10 NOTE — Telephone Encounter (Signed)
Left pt a message to call back. 

## 2016-05-13 ENCOUNTER — Ambulatory Visit: Payer: Self-pay | Admitting: Surgery

## 2016-05-13 NOTE — H&P (Signed)
[name] 05/13/2016 11:30 AM Location: [facility] Patient #: [id number] DOB: 22-Jun-1945 Married / Language: English / Race: White Female  History of Present Illness Karla Moores A. Maryelizabeth Eberle MD; 05/13/2016 11:48 AM) Patient words: Right upper thigh Mass.  Patient sent at the request of Elon Alas for a right upper thigh mass 1 month. Patient noted a soft nontender mass in her right upper anterior thigh has gotten bigger over the last 4 weeks. There's been no redness, drainage, or numbness. Her father had a history of lipoma. She had a previous mass excised from her left thigh over 40 years ago which was pre-melanoma according to the patient. She has no weakness or difficulty using the right leg.  The patient is a 71 year old female.   Past Surgical History Malachy Moan, [place]; 05/13/2016 11:31 AM) Breast Biopsy Left. Colon Polyp Removal - Colonoscopy Hemorrhoidectomy Tonsillectomy  Diagnostic Studies History Malachy Moan, RMA; 05/13/2016 11:31 AM) Colonoscopy 1-5 years ago  Allergies Malachy Moan, RMA; 05/13/2016 11:31 AM) Atorvastatin Calcium *ANTIHYPERLIPIDEMICS* Fenofibrate *ANTIHYPERLIPIDEMICS* Hydralazine & HCTZ *ANTIHYPERTENSIVES*  Medication History Malachy Moan, RMA; 05/13/2016 11:35 AM) Estradiol (0.5MG  Tablet, Oral) Active. Telmisartan-HCTZ (80-25MG  Tablet, Oral) Active. Vitamin C (Oral) Specific strength unknown - Active. Glucosamine-Chondroitin (500-400MG  Tablet, Oral) Active. Metoprolol Succinate (50MG  Tablet ER, Oral) Active. Multiple Vitamin (Oral) Active. Omega-3 Fatty Acids (1000MG  Capsule, Oral) Active. Biotin (Oral) Specific strength unknown - Active. Medications Reconciled  Social History Malachy Moan, [place]; 05/13/2016 11:31 AM) Alcohol use Moderate alcohol use. Caffeine use Coffee, Tea. Illicit drug use Prefer to discuss with provider. Tobacco use Former smoker.  Family History Malachy Moan, [place];  05/13/2016 11:31 AM) Anesthetic complications Mother. Breast Cancer Mother. Depression Brother, Mother. Diabetes Mellitus Brother, Mother, Sister. Heart Disease Father, Mother. Heart disease in female family member before age 102 Heart disease in female family member before age 50 Hypertension Brother, Father, Mother, Sister. Ischemic Bowel Disease Mother. Prostate Cancer Father. Thyroid problems Mother.  Other Problems Malachy Moan, RMA; 05/13/2016 11:31 AM) Hemorrhoids High blood pressure Lump In Breast     Review of Systems Malachy Moan RMA; 05/13/2016 11:31 AM) General Not Present- Appetite Loss, Chills, Fatigue, Fever, Night Sweats, Weight Gain and Weight Loss. Skin Not Present- Change in Wart/Mole, Dryness, Hives, Jaundice, New Lesions, Non-Healing Wounds, Rash and Ulcer. HEENT Present- Wears glasses/contact lenses. Not Present- Earache, Hearing Loss, Hoarseness, Nose Bleed, Oral Ulcers, Ringing in the Ears, Seasonal Allergies, Sinus Pain, Sore Throat, Visual Disturbances and Yellow Eyes. Respiratory Not Present- Bloody sputum, Chronic Cough, Difficulty Breathing, Snoring and Wheezing. Breast Not Present- Breast Mass, Breast Pain, Nipple Discharge and Skin Changes. Cardiovascular Not Present- Chest Pain, Difficulty Breathing Lying Down, Leg Cramps, Palpitations, Rapid Heart Rate, Shortness of Breath and Swelling of Extremities. Gastrointestinal Present- Hemorrhoids. Not Present- Abdominal Pain, Bloating, Bloody Stool, Change in Bowel Habits, Chronic diarrhea, Constipation, Difficulty Swallowing, Excessive gas, Gets full quickly at meals, Indigestion, Nausea, Rectal Pain and Vomiting. Musculoskeletal Not Present- Back Pain, Joint Pain, Joint Stiffness, Muscle Pain, Muscle Weakness and Swelling of Extremities. Neurological Not Present- Decreased Memory, Fainting, Headaches, Numbness, Seizures, Tingling, Tremor, Trouble walking and Weakness. Psychiatric Not  Present- Anxiety, Bipolar, Change in Sleep Pattern, Depression, Fearful and Frequent crying. Endocrine Not Present- Cold Intolerance, Excessive Hunger, Hair Changes, Heat Intolerance, Hot flashes and New Diabetes. Hematology Not Present- Blood Thinners, Easy Bruising, Excessive bleeding, Gland problems, HIV and Persistent Infections.  Vitals Malachy Moan RMA; 05/13/2016 11:35 AM) 05/13/2016 11:35 AM Weight: 171.2 lb Height: 64in Body Surface Area: 1.83  m Body Mass Index: 29.39 kg/m  Temp.: 16F  Pulse: 71 (Regular)  BP: 148/90 (Sitting, Left Arm, Standard)      Physical Exam (Melessa Cowell A. Veleria Barnhardt MD; 05/13/2016 11:53 AM)  General Mental Status-Alert. General Appearance-Consistent with stated age. Hydration-Well hydrated. Voice-Normal.  Integumentary Note: Left upper thighs a 15 cm x 15 cm mobile subcutaneous mass consistent with lipoma.  Head and Neck Head-normocephalic, atraumatic with no lesions or palpable masses. Trachea-midline. Thyroid Gland Characteristics - normal size and consistency.  Eye Eyeball - Bilateral-Extraocular movements intact. Sclera/Conjunctiva - Bilateral-No scleral icterus.  Chest and Lung Exam Chest and lung exam reveals -quiet, even and easy respiratory effort with no use of accessory muscles and on auscultation, normal breath sounds, no adventitious sounds and normal vocal resonance. Inspection Chest Wall - Normal. Back - normal.  Cardiovascular Cardiovascular examination reveals -normal heart sounds, regular rate and rhythm with no murmurs and normal pedal pulses bilaterally.  Neurologic Neurologic evaluation reveals -alert and oriented x 3 with no impairment of recent or remote memory. Mental Status-Normal.  Musculoskeletal Normal Exam - Left-Upper Extremity Strength Normal and Lower Extremity Strength Normal. Normal Exam - Right-Upper Extremity Strength Normal and Lower Extremity Strength  Normal.   Lymphatic Femoral & Inguinal  Generalized Femoral & Inguinal Lymphatics: Bilateral - Description - Normal. Tenderness - Non Tender.    Assessment & Plan (Wrenn Willcox A. Merric Yost MD; 05/13/2016 11:50 AM)  MASS OF RIGHT THIGH (R22.41) Impression: This more likely represents a large lipoma. It is not hard and fixed. It has come up rather quickly. I do not feel MRI necessary in this situation given its physical exam findings. She would like to have it removed. I discussed her risk of surgery as well as benefits and long-term expectations. Risk of bleeding, infection, nerve injury causing weakness to the leg, recurrence, the use of a drain, cosmetic deformity, wound complications requiring postoperative care, anesthesia risk, cardiovascular risks, DVT, and the need further treatments and/or procedures.  Current Plans Pt Education - [facility] Free Text Education/Instructions: discussed with patient and provided information. Pt Education - [facility] General Post-op HCI The pathophysiology of skin & subcutaneous masses was discussed. Natural history risks without surgery were discussed. I recommended surgery to remove the mass. I explained the technique of removal with use of local anesthesia & possible need for more aggressive sedation/anesthesia for patient comfort.  Risks such as bleeding, infection, wound breakdown, heart attack, death, and other risks were discussed. I noted a good likelihood this will help address the problem. Possibility that this will not correct all symptoms was explained. Possibility of regrowth/recurrence of the mass was discussed. We will work to minimize complications. Questions were answered. The patient expresses understanding & wishes to proceed with surgery.

## 2016-05-13 NOTE — Telephone Encounter (Signed)
Left message with pt's spouse to have pt return my call.

## 2016-05-24 ENCOUNTER — Encounter (HOSPITAL_COMMUNITY): Payer: Self-pay

## 2016-05-24 ENCOUNTER — Encounter (HOSPITAL_COMMUNITY)
Admission: RE | Admit: 2016-05-24 | Discharge: 2016-05-24 | Disposition: A | Discharge status: Home / Self Care | Payer: Medicare Other | Source: Ambulatory Visit | Attending: Surgery | Admitting: Surgery

## 2016-05-24 DIAGNOSIS — Z01812 Encounter for preprocedural laboratory examination: Secondary | ICD-10-CM | POA: Diagnosis not present

## 2016-05-24 DIAGNOSIS — R2241 Localized swelling, mass and lump, right lower limb: Secondary | ICD-10-CM | POA: Diagnosis not present

## 2016-05-24 DIAGNOSIS — Z01810 Encounter for preprocedural cardiovascular examination: Secondary | ICD-10-CM | POA: Insufficient documentation

## 2016-05-24 DIAGNOSIS — I252 Old myocardial infarction: Secondary | ICD-10-CM | POA: Diagnosis not present

## 2016-05-24 LAB — CBC
HCT: 38.4 % (ref 36.0–46.0)
Hemoglobin: 12.7 g/dL (ref 12.0–15.0)
MCH: 34.2 pg — ABNORMAL HIGH (ref 26.0–34.0)
MCHC: 33.1 g/dL (ref 30.0–36.0)
MCV: 103.5 fL — ABNORMAL HIGH (ref 78.0–100.0)
Platelets: 293 10*3/uL (ref 150–400)
RBC: 3.71 MIL/uL — ABNORMAL LOW (ref 3.87–5.11)
RDW: 13.5 % (ref 11.5–15.5)
WBC: 7.4 10*3/uL (ref 4.0–10.5)

## 2016-05-24 LAB — BASIC METABOLIC PANEL
ANION GAP: 10 (ref 5–15)
BUN: 15 mg/dL (ref 6–20)
CALCIUM: 9.4 mg/dL (ref 8.9–10.3)
CO2: 27 mmol/L (ref 22–32)
Chloride: 101 mmol/L (ref 101–111)
Creatinine, Ser: 0.73 mg/dL (ref 0.44–1.00)
GFR calc Af Amer: >60 mL/min (ref >60)
Glucose, Bld: 100 mg/dL — ABNORMAL HIGH (ref 65–99)
Potassium: 4 mmol/L (ref 3.5–5.1)
Sodium: 138 mmol/L (ref 135–145)

## 2016-05-24 NOTE — Pre-Procedure Instructions (Addendum)
[name]  05/24/2016      [facility] - [place], [place] [address] Phone: [phone] Fax: [phone]    Your procedure is scheduled on  05/30/2016    Thursday .  Report to [facility] Admitting at 7:30 A.M.   Call this number if you have problems the morning of surgery:  [phone]   Remember:  Do not eat food or drink liquids after midnight.   Take these medicines the morning of surgery with A SIP OF WATER Tylenol if needed,estradiol(Estrace),Metoprolol(Lopressor),     TWO HOURS PRIOR TO ARRIVAL DRINK ONE BOX OF BOOST(5:30 AM)         STOP ASPIRIN,ANTIINFLAMATORIES (IBUPROFEN,ALEVE,MOTRIN,ADVIL,GOODY'S POWDERS),HERBAL SUPPLEMENTS,FISH OIL,AND VITAMINS 5-7 DAYS PRIOR TO SURGERY     Do not wear jewelry, make-up or nail polish.  Do not wear lotions, powders, or perfumes, or deoderant.  Do not shave 48 hours prior to surgery.  Men may shave face and neck.   Do not bring valuables to the hospital.  [facility] is not responsible for any belongings or valuables.  Contacts, dentures or bridgework may not be worn into surgery.  Leave your suitcase in the car.  After surgery it may be brought to your room.  For patients admitted to the hospital, discharge time will be determined by your treatment team.  Patients discharged the day of surgery will not be allowed to drive home.     Special Instructions: [facility] - Preparing for Surgery  Before surgery, you can play an important role.  Because skin is not sterile, your skin needs to be as free of germs as possible.  You can reduce the number of germs on you skin by washing with CHG (chlorahexidine gluconate) soap before surgery.  CHG is an antiseptic cleaner which kills germs and bonds with the skin to continue killing germs even after washing.  Please DO NOT use if you have an allergy to CHG or antibacterial soaps.  If your skin  becomes reddened/irritated stop using the CHG and inform your nurse when you arrive at Short Stay.  Do not shave (including legs and underarms) for at least 48 hours prior to the first CHG shower.  You may shave your face.  Please follow these instructions carefully:   1.  Shower with CHG Soap the night before surgery and the   morning of Surgery.  2.  If you choose to wash your hair, wash your hair first as usual with your normal shampoo.  3.  After you shampoo, rinse your hair and body thoroughly to remove the  Shampoo.  4.  Use CHG as you would any other liquid soap.  You can apply chg directly  to the skin and wash gently with scrungie or a clean washcloth.  5.  Apply the CHG Soap to your body ONLY FROM THE NECK DOWN.   Do not use on open wounds or open sores.  Avoid contact with your eyes,  ears, mouth and genitals (private parts).  Wash genitals (private parts) with your normal soap.  6.  Wash thoroughly, paying special attention to the area where your surgery will be performed.  7.  Thoroughly rinse your body with warm water from the neck down.  8.  DO NOT shower/wash with your normal soap after using and rinsing o  the CHG Soap.  9.  Pat yourself dry with a clean towel.  10.  Wear clean pajamas.            11.  Place clean sheets on your bed the night of your first shower and do not sleep with pets.  Day of Surgery  Do not apply any lotions/deodorants the morning of surgery.  Please wear clean clothes to the hospital/surgery center.   Please read over the following fact sheets that you were given. Surgical Site Infection Prevention

## 2016-05-27 ENCOUNTER — Encounter (HOSPITAL_COMMUNITY): Payer: Self-pay

## 2016-05-27 NOTE — Progress Notes (Signed)
Anesthesia Chart Review: Patient is a 71 year old female scheduled for right thigh mass excision on 05/30/16 by Dr. Brantley Stage. DX: Right thigh mass. Differential includes lipoma.   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   PCP is Debbrah Alar, NP with [facility]. Established care 05/23/14. Last visit 05/07/16 (see [place]) for HTN follow-up. Patient was intolerant to hydralazine. Remains on metoprolol and Micardis-HCT. BP then 164/98, but home readings ~ 130/80, so no adjustments made at that time. BP 04/30/16 was 132/86.  BP (!) 167/99   Pulse 67   Temp 36.4 C   Resp 18   Ht 5\' 4"  (1.626 m)   Wt 172 lb 9.6 oz (78.3 kg)   LMP 03/04/2002   SpO2 99%   BMI 29.63 kg/m  Unfortunately, BP was not rechecked at PAT. PCP was having patient monitor home BP readings after BP on 05/07/16 was 164/98.  Patient called and reported readings of 136/78, 139/82, 134/73. She was instructed to let PCP know if BP consistently > 140/90, otherwise 3 month follow-up planned.  Meds include Estrace, Lopressor 50 mg 3 times a day, fish oil, Micardis HCT 80-25 mg daily.  EKG 05/24/16: NSR, consider septal infarct (age undetermined). Unable to exclude lead placement. Overall, I think tracing is not significantly changed when compared to 09/19/14.  There was question of prior stress test that was requested from her PCP; however, [facility] records can be viewed in [facility] and there is no prior stress or echo seen.   Preoperative labs noted. A1c 5.9 05/07/16.  She has had some intermittently elevated BP readings, but home readings recently ~ 130s/80. She has had close follow-up with her PCP. She will take metoprolol on the morning of surgery, but Micardis-HCT to be held that morning only with plans to resume post-operatively. She will get vitals on  arrival. If BP not significantly elevated and otherwise no acute changes then I would anticipate that she could proceed as planned.  George Hugh [facility] Short Stay Center/Anesthesiology Phone [phone] 05/27/2016 3:33 PM

## 2016-05-29 MED ORDER — CEFAZOLIN SODIUM 10 G IJ SOLR
3.0000 g | INTRAMUSCULAR | Status: AC
  Administered 2016-05-30: 3 g via INTRAVENOUS
  Filled 2016-05-29: qty 3000

## 2016-05-30 ENCOUNTER — Encounter (HOSPITAL_COMMUNITY)
Admission: RE | Disposition: A | Discharge status: Home / Self Care | Payer: Self-pay | Source: Ambulatory Visit | Attending: Surgery

## 2016-05-30 ENCOUNTER — Ambulatory Visit (HOSPITAL_COMMUNITY)
Admission: RE | Admit: 2016-05-30 | Discharge: 2016-05-30 | Disposition: A | Discharge status: Home / Self Care | Payer: Medicare Other | Source: Ambulatory Visit | Attending: Surgery | Admitting: Surgery

## 2016-05-30 ENCOUNTER — Ambulatory Visit (HOSPITAL_COMMUNITY): Payer: Medicare Other | Admitting: Anesthesiology

## 2016-05-30 ENCOUNTER — Ambulatory Visit (HOSPITAL_COMMUNITY): Payer: Medicare Other | Admitting: Vascular Surgery

## 2016-05-30 ENCOUNTER — Encounter (HOSPITAL_COMMUNITY): Payer: Self-pay | Admitting: *Deleted

## 2016-05-30 DIAGNOSIS — D1723 Benign lipomatous neoplasm of skin and subcutaneous tissue of right leg: Secondary | ICD-10-CM | POA: Diagnosis not present

## 2016-05-30 DIAGNOSIS — Z87891 Personal history of nicotine dependence: Secondary | ICD-10-CM | POA: Insufficient documentation

## 2016-05-30 DIAGNOSIS — I10 Essential (primary) hypertension: Secondary | ICD-10-CM | POA: Diagnosis not present

## 2016-05-30 DIAGNOSIS — R2241 Localized swelling, mass and lump, right lower limb: Secondary | ICD-10-CM | POA: Diagnosis present

## 2016-05-30 DIAGNOSIS — Z79899 Other long term (current) drug therapy: Secondary | ICD-10-CM | POA: Diagnosis not present

## 2016-05-30 HISTORY — PX: EXCISION MASS LOWER EXTREMETIES: SHX6705

## 2016-05-30 SURGERY — EXCISION MASS LOWER EXTREMITIES
Anesthesia: General | Site: Thigh | Laterality: Right

## 2016-05-30 MED ORDER — LACTATED RINGERS IV SOLN
INTRAVENOUS | Status: DC | PRN
  Administered 2016-05-30 (×2): via INTRAVENOUS

## 2016-05-30 MED ORDER — MIDAZOLAM HCL 5 MG/5ML IJ SOLN
INTRAMUSCULAR | Status: DC | PRN
  Administered 2016-05-30: 2 mg via INTRAVENOUS

## 2016-05-30 MED ORDER — 0.9 % SODIUM CHLORIDE (POUR BTL) OPTIME
TOPICAL | Status: DC | PRN
  Administered 2016-05-30: 1000 mL

## 2016-05-30 MED ORDER — FENTANYL CITRATE (PF) 100 MCG/2ML IJ SOLN
INTRAMUSCULAR | Status: AC
  Filled 2016-05-30: qty 2

## 2016-05-30 MED ORDER — PROPOFOL 10 MG/ML IV BOLUS
INTRAVENOUS | Status: DC | PRN
  Administered 2016-05-30: 20 mg via INTRAVENOUS
  Administered 2016-05-30: 180 mg via INTRAVENOUS

## 2016-05-30 MED ORDER — CELECOXIB 200 MG PO CAPS
400.0000 mg | ORAL_CAPSULE | ORAL | Status: AC
  Administered 2016-05-30: 400 mg via ORAL
  Filled 2016-05-30: qty 2

## 2016-05-30 MED ORDER — EPHEDRINE 5 MG/ML INJ
INTRAVENOUS | Status: AC
  Filled 2016-05-30: qty 40

## 2016-05-30 MED ORDER — ROCURONIUM BROMIDE 50 MG/5ML IV SOSY
PREFILLED_SYRINGE | INTRAVENOUS | Status: AC
  Filled 2016-05-30: qty 5

## 2016-05-30 MED ORDER — DEXAMETHASONE SODIUM PHOSPHATE 10 MG/ML IJ SOLN
INTRAMUSCULAR | Status: AC
  Filled 2016-05-30: qty 1

## 2016-05-30 MED ORDER — EPHEDRINE SULFATE-NACL 50-0.9 MG/10ML-% IV SOSY
PREFILLED_SYRINGE | INTRAVENOUS | Status: DC | PRN
  Administered 2016-05-30: 5 mg via INTRAVENOUS
  Administered 2016-05-30: 10 mg via INTRAVENOUS
  Administered 2016-05-30 (×4): 5 mg via INTRAVENOUS
  Administered 2016-05-30: 10 mg via INTRAVENOUS

## 2016-05-30 MED ORDER — MIDAZOLAM HCL 2 MG/2ML IJ SOLN
INTRAMUSCULAR | Status: AC
  Filled 2016-05-30: qty 2

## 2016-05-30 MED ORDER — ONDANSETRON HCL 4 MG/2ML IJ SOLN
INTRAMUSCULAR | Status: AC
  Filled 2016-05-30: qty 2

## 2016-05-30 MED ORDER — FENTANYL CITRATE (PF) 100 MCG/2ML IJ SOLN
25.0000 ug | INTRAMUSCULAR | Status: DC | PRN
Start: 2016-05-30 — End: 2016-05-30
  Administered 2016-05-30 (×3): 50 ug via INTRAVENOUS

## 2016-05-30 MED ORDER — PROPOFOL 10 MG/ML IV BOLUS
INTRAVENOUS | Status: AC
  Filled 2016-05-30: qty 20

## 2016-05-30 MED ORDER — PROMETHAZINE HCL 25 MG/ML IJ SOLN: 6.2500 mg | INTRAMUSCULAR | Status: DC | PRN

## 2016-05-30 MED ORDER — CHLORHEXIDINE GLUCONATE CLOTH 2 % EX PADS: 6.0000 | MEDICATED_PAD | Freq: Once | CUTANEOUS | Status: DC

## 2016-05-30 MED ORDER — DEXAMETHASONE SODIUM PHOSPHATE 10 MG/ML IJ SOLN
INTRAMUSCULAR | Status: DC | PRN
  Administered 2016-05-30: 10 mg via INTRAVENOUS

## 2016-05-30 MED ORDER — BUPIVACAINE HCL (PF) 0.25 % IJ SOLN
INTRAMUSCULAR | Status: DC | PRN
  Administered 2016-05-30: 20 mL

## 2016-05-30 MED ORDER — FENTANYL CITRATE (PF) 250 MCG/5ML IJ SOLN
INTRAMUSCULAR | Status: AC
  Filled 2016-05-30: qty 5

## 2016-05-30 MED ORDER — ONDANSETRON HCL 4 MG/2ML IJ SOLN
INTRAMUSCULAR | Status: DC | PRN
  Administered 2016-05-30: 4 mg via INTRAVENOUS

## 2016-05-30 MED ORDER — LIDOCAINE 2% (20 MG/ML) 5 ML SYRINGE
INTRAMUSCULAR | Status: AC
  Filled 2016-05-30: qty 10

## 2016-05-30 MED ORDER — FENTANYL CITRATE (PF) 100 MCG/2ML IJ SOLN
INTRAMUSCULAR | Status: DC | PRN
  Administered 2016-05-30 (×2): 25 ug via INTRAVENOUS
  Administered 2016-05-30: 50 ug via INTRAVENOUS
  Administered 2016-05-30: 25 ug via INTRAVENOUS
  Administered 2016-05-30: 50 ug via INTRAVENOUS
  Administered 2016-05-30: 25 ug via INTRAVENOUS
  Administered 2016-05-30: 50 ug via INTRAVENOUS

## 2016-05-30 MED ORDER — PHENYLEPHRINE 40 MCG/ML (10ML) SYRINGE FOR IV PUSH (FOR BLOOD PRESSURE SUPPORT)
PREFILLED_SYRINGE | INTRAVENOUS | Status: AC
  Filled 2016-05-30: qty 10

## 2016-05-30 MED ORDER — ACETAMINOPHEN 500 MG PO TABS
1000.0000 mg | ORAL_TABLET | ORAL | Status: AC
  Administered 2016-05-30: 1000 mg via ORAL
  Filled 2016-05-30: qty 2

## 2016-05-30 MED ORDER — LIDOCAINE 2% (20 MG/ML) 5 ML SYRINGE
INTRAMUSCULAR | Status: DC | PRN
  Administered 2016-05-30: 60 mg via INTRAVENOUS

## 2016-05-30 MED ORDER — SUCCINYLCHOLINE CHLORIDE 200 MG/10ML IV SOSY
PREFILLED_SYRINGE | INTRAVENOUS | Status: AC
  Filled 2016-05-30: qty 10

## 2016-05-30 MED ORDER — GABAPENTIN 300 MG PO CAPS
300.0000 mg | ORAL_CAPSULE | ORAL | Status: AC
  Administered 2016-05-30: 300 mg via ORAL
  Filled 2016-05-30: qty 1

## 2016-05-30 MED ORDER — OXYCODONE HCL 5 MG PO TABS: 5.0000 mg | ORAL_TABLET | Freq: Four times a day (QID) | ORAL | 0 refills | Status: DC | PRN

## 2016-05-30 MED ORDER — BUPIVACAINE HCL (PF) 0.25 % IJ SOLN
INTRAMUSCULAR | Status: AC
  Filled 2016-05-30: qty 30

## 2016-05-30 SURGICAL SUPPLY — 41 items
ADH SKN CLS APL DERMABOND .7 (GAUZE/BANDAGES/DRESSINGS) ×1
BIOPATCH RED 1 DISK 7.0 (GAUZE/BANDAGES/DRESSINGS) ×1 IMPLANT
BIOPATCH RED 1IN DISK 7.0MM (GAUZE/BANDAGES/DRESSINGS) ×1
CANISTER SUCT 3000ML PPV (MISCELLANEOUS) ×3 IMPLANT
CHLORAPREP W/TINT 26ML (MISCELLANEOUS) IMPLANT
COVER SURGICAL LIGHT HANDLE (MISCELLANEOUS) ×3 IMPLANT
DECANTER SPIKE VIAL GLASS SM (MISCELLANEOUS) ×2 IMPLANT
DERMABOND ADVANCED (GAUZE/BANDAGES/DRESSINGS) ×2
DERMABOND ADVANCED .7 DNX12 (GAUZE/BANDAGES/DRESSINGS) IMPLANT
DRAIN CHANNEL 19F RND (DRAIN) ×2 IMPLANT
DRAPE HALF SHEET 40X57 (DRAPES) ×1 IMPLANT
DRAPE LAPAROSCOPIC ABDOMINAL (DRAPES) ×2 IMPLANT
DRAPE PROXIMA HALF (DRAPES) ×1 IMPLANT
DRAPE UTILITY XL STRL (DRAPES) ×2 IMPLANT
DRSG TEGADERM 2-3/8X2-3/4 SM (GAUZE/BANDAGES/DRESSINGS) ×2 IMPLANT
ELECT REM PT RETURN 9FT ADLT (ELECTROSURGICAL) ×3
ELECTRODE REM PT RTRN 9FT ADLT (ELECTROSURGICAL) ×1 IMPLANT
EVACUATOR SILICONE 100CC (DRAIN) ×2 IMPLANT
GAUZE SPONGE 4X4 12PLY STRL (GAUZE/BANDAGES/DRESSINGS) ×1 IMPLANT
GLOVE BIO SURGEON STRL SZ7 (GLOVE) ×2 IMPLANT
GLOVE BIO SURGEON STRL SZ8 (GLOVE) ×3 IMPLANT
GLOVE BIOGEL PI IND STRL 8 (GLOVE) ×1 IMPLANT
GLOVE BIOGEL PI INDICATOR 8 (GLOVE) ×2
GOWN STRL REUS W/ TWL LRG LVL3 (GOWN DISPOSABLE) ×2 IMPLANT
GOWN STRL REUS W/ TWL XL LVL3 (GOWN DISPOSABLE) ×1 IMPLANT
GOWN STRL REUS W/TWL LRG LVL3 (GOWN DISPOSABLE) ×3
GOWN STRL REUS W/TWL XL LVL3 (GOWN DISPOSABLE) ×3
KIT BASIN OR (CUSTOM PROCEDURE TRAY) ×3 IMPLANT
KIT ROOM TURNOVER OR (KITS) ×3 IMPLANT
NS IRRIG 1000ML POUR BTL (IV SOLUTION) ×3 IMPLANT
PACK GENERAL/GYN (CUSTOM PROCEDURE TRAY) ×3 IMPLANT
PAD ARMBOARD 7.5X6 YLW CONV (MISCELLANEOUS) ×4 IMPLANT
STOCKINETTE IMPERVIOUS 9X36 MD (GAUZE/BANDAGES/DRESSINGS) ×1 IMPLANT
SUT ETHILON 2 0 FS 18 (SUTURE) ×2 IMPLANT
SUT MNCRL AB 4-0 PS2 18 (SUTURE) ×2 IMPLANT
SUT VIC AB 0 CT2 27 (SUTURE) ×4 IMPLANT
SUT VIC AB 2-0 SH 18 (SUTURE) ×2 IMPLANT
TOWEL OR 17X24 6PK STRL BLUE (TOWEL DISPOSABLE) ×1 IMPLANT
TOWEL OR 17X26 10 PK STRL BLUE (TOWEL DISPOSABLE) ×3 IMPLANT
UNDERPAD 30X30 (UNDERPADS AND DIAPERS) ×1 IMPLANT
WATER STERILE IRR 1000ML POUR (IV SOLUTION) IMPLANT

## 2016-05-30 NOTE — Transfer of Care (Signed)
Immediate Anesthesia Transfer of Care Note  Patient: [name]  Procedure(s) Performed: Procedure(s) with comments: EXCISION RIGHT THIGH MASS (Right) - EXCISION RIGHT THIGH MASS  Patient Location: PACU  Anesthesia Type:General  Level of Consciousness: awake, alert  and oriented  Airway & Oxygen Therapy: Patient Spontanous Breathing and Patient connected to nasal cannula oxygen  Post-op Assessment: Report given to RN, Post -op Vital signs reviewed and stable and Patient moving all extremities X 4  Post vital signs: Reviewed and stable  Last Vitals:  Vitals:   05/30/16 0805  BP: (!) 154/88  Pulse: 61  Resp: 16  Temp: 36.7 C    Last Pain:  Vitals:   05/30/16 0805  TempSrc: Oral         Complications: No apparent anesthesia complications

## 2016-05-30 NOTE — Anesthesia Procedure Notes (Signed)
Procedure Name: LMA Insertion Date/Time: 05/30/2016 9:43 AM Performed by: Merrilyn Puma B Pre-anesthesia Checklist: Patient identified, Emergency Drugs available, Suction available, Patient being monitored and Timeout performed Patient Re-evaluated:Patient Re-evaluated prior to inductionOxygen Delivery Method: Circle system utilized Preoxygenation: Pre-oxygenation with 100% oxygen Intubation Type: IV induction Ventilation: Mask ventilation without difficulty LMA: LMA inserted LMA Size: 4.0 Number of attempts: 1 Placement Confirmation: positive ETCO2,  CO2 detector and breath sounds checked- equal and bilateral Tube secured with: Tape Dental Injury: Teeth and Oropharynx as per pre-operative assessment

## 2016-05-30 NOTE — Interval H&P Note (Signed)
History and Physical Interval Note:  05/30/2016 9:28 AM  [name]  has presented today for surgery, with the diagnosis of Right thigh mass  The various methods of treatment have been discussed with the patient and family. After consideration of risks, benefits and other options for treatment, the patient has consented to  Procedure(s): EXCISION RIGHT THIGH MASS (Right) as a surgical intervention .  The patient's history has been reviewed, patient examined, no change in status, stable for surgery.  I have reviewed the patient's chart and labs.  Questions were answered to the patient's satisfaction.     Benecio Kluger A.

## 2016-05-30 NOTE — Anesthesia Postprocedure Evaluation (Addendum)
Anesthesia Post Note  Patient: [name]  Procedure(s) Performed: Procedure(s) (LRB): EXCISION RIGHT THIGH MASS (Right)  Patient location during evaluation: PACU Anesthesia Type: General Level of consciousness: awake and alert Pain management: pain level controlled Vital Signs Assessment: post-procedure vital signs reviewed and stable Respiratory status: spontaneous breathing, nonlabored ventilation, respiratory function stable and patient connected to nasal cannula oxygen Cardiovascular status: blood pressure returned to baseline and stable Postop Assessment: no signs of nausea or vomiting Anesthetic complications: no       Last Vitals:  Vitals:   05/30/16 0805  BP: (!) 154/88  Pulse: 61  Resp: 16  Temp: 36.7 C    Last Pain:  Vitals:   05/30/16 0805  TempSrc: Oral                 Warrick Llera S

## 2016-05-30 NOTE — H&P (View-Only) (Signed)
[name] 05/13/2016 11:30 AM Location: [facility] Patient #: [id number] DOB: Feb 03, 1946 Married / Language: English / Race: White Female  History of Present Illness [name] A. Karla Schroyer MD; 05/13/2016 11:48 AM) Patient words: Right upper thigh Mass.  Patient sent at the request of [name] for a right upper thigh mass 1 month. Patient noted a soft nontender mass in her right upper anterior thigh has gotten bigger over the last 4 weeks. There's been no redness, drainage, or numbness. Her father had a history of lipoma. She had a previous mass excised from her left thigh over 40 years ago which was pre-melanoma according to the patient. She has no weakness or difficulty using the right leg.  The patient is a 71 year old female.   Past Surgical History [name], [place]; 05/13/2016 11:31 AM) Breast Biopsy Left. Colon Polyp Removal - Colonoscopy Hemorrhoidectomy Tonsillectomy  Diagnostic Studies History [name], RMA; 05/13/2016 11:31 AM) Colonoscopy 1-5 years ago  Allergies [name], RMA; 05/13/2016 11:31 AM) Atorvastatin Calcium *ANTIHYPERLIPIDEMICS* Fenofibrate *ANTIHYPERLIPIDEMICS* Hydralazine & HCTZ *ANTIHYPERTENSIVES*  Medication History [name], RMA; 05/13/2016 11:35 AM) Estradiol (0.5MG  Tablet, Oral) Active. Telmisartan-HCTZ (80-25MG  Tablet, Oral) Active. Vitamin C (Oral) Specific strength unknown - Active. Glucosamine-Chondroitin (500-400MG  Tablet, Oral) Active. Metoprolol Succinate (50MG  Tablet ER, Oral) Active. Multiple Vitamin (Oral) Active. Omega-3 Fatty Acids (1000MG  Capsule, Oral) Active. Biotin (Oral) Specific strength unknown - Active. Medications Reconciled  Social History [name], [place]; 05/13/2016 11:31 AM) Alcohol use Moderate alcohol use. Caffeine use Coffee, Tea. Illicit drug use Prefer to discuss with provider. Tobacco use Former smoker.  Family History [name], [place];  05/13/2016 11:31 AM) Anesthetic complications Mother. Breast Cancer Mother. Depression Brother, Mother. Diabetes Mellitus Brother, Mother, Sister. Heart Disease Father, Mother. Heart disease in female family member before age 58 Heart disease in female family member before age 7 Hypertension Brother, Father, Mother, Sister. Ischemic Bowel Disease Mother. Prostate Cancer Father. Thyroid problems Mother.  Other Problems [name], RMA; 05/13/2016 11:31 AM) Hemorrhoids High blood pressure Lump In Breast     Review of Systems [name] RMA; 05/13/2016 11:31 AM) General Not Present- Appetite Loss, Chills, Fatigue, Fever, Night Sweats, Weight Gain and Weight Loss. Skin Not Present- Change in Wart/Mole, Dryness, Hives, Jaundice, New Lesions, Non-Healing Wounds, Rash and Ulcer. HEENT Present- Wears glasses/contact lenses. Not Present- Earache, Hearing Loss, Hoarseness, Nose Bleed, Oral Ulcers, Ringing in the Ears, Seasonal Allergies, Sinus Pain, Sore Throat, Visual Disturbances and Yellow Eyes. Respiratory Not Present- Bloody sputum, Chronic Cough, Difficulty Breathing, Snoring and Wheezing. Breast Not Present- Breast Mass, Breast Pain, Nipple Discharge and Skin Changes. Cardiovascular Not Present- Chest Pain, Difficulty Breathing Lying Down, Leg Cramps, Palpitations, Rapid Heart Rate, Shortness of Breath and Swelling of Extremities. Gastrointestinal Present- Hemorrhoids. Not Present- Abdominal Pain, Bloating, Bloody Stool, Change in Bowel Habits, Chronic diarrhea, Constipation, Difficulty Swallowing, Excessive gas, Gets full quickly at meals, Indigestion, Nausea, Rectal Pain and Vomiting. Musculoskeletal Not Present- Back Pain, Joint Pain, Joint Stiffness, Muscle Pain, Muscle Weakness and Swelling of Extremities. Neurological Not Present- Decreased Memory, Fainting, Headaches, Numbness, Seizures, Tingling, Tremor, Trouble walking and Weakness. Psychiatric Not  Present- Anxiety, Bipolar, Change in Sleep Pattern, Depression, Fearful and Frequent crying. Endocrine Not Present- Cold Intolerance, Excessive Hunger, Hair Changes, Heat Intolerance, Hot flashes and New Diabetes. Hematology Not Present- Blood Thinners, Easy Bruising, Excessive bleeding, Gland problems, HIV and Persistent Infections.  Vitals [name] RMA; 05/13/2016 11:35 AM) 05/13/2016 11:35 AM Weight: 171.2 lb Height: 64in Body Surface Area: 1.83  m Body Mass Index: 29.39 kg/m  Temp.: 66F  Pulse: 71 (Regular)  BP: 148/90 (Sitting, Left Arm, Standard)      Physical Exam (Karla Hulbert A. Kashawna Manzer MD; 05/13/2016 11:53 AM)  General Mental Status-Alert. General Appearance-Consistent with stated age. Hydration-Well hydrated. Voice-Normal.  Integumentary Note: Left upper thighs a 15 cm x 15 cm mobile subcutaneous mass consistent with lipoma.  Head and Neck Head-normocephalic, atraumatic with no lesions or palpable masses. Trachea-midline. Thyroid Gland Characteristics - normal size and consistency.  Eye Eyeball - Bilateral-Extraocular movements intact. Sclera/Conjunctiva - Bilateral-No scleral icterus.  Chest and Lung Exam Chest and lung exam reveals -quiet, even and easy respiratory effort with no use of accessory muscles and on auscultation, normal breath sounds, no adventitious sounds and normal vocal resonance. Inspection Chest Wall - Normal. Back - normal.  Cardiovascular Cardiovascular examination reveals -normal heart sounds, regular rate and rhythm with no murmurs and normal pedal pulses bilaterally.  Neurologic Neurologic evaluation reveals -alert and oriented x 3 with no impairment of recent or remote memory. Mental Status-Normal.  Musculoskeletal Normal Exam - Left-Upper Extremity Strength Normal and Lower Extremity Strength Normal. Normal Exam - Right-Upper Extremity Strength Normal and Lower Extremity Strength  Normal.   Lymphatic Femoral & Inguinal  Generalized Femoral & Inguinal Lymphatics: Bilateral - Description - Normal. Tenderness - Non Tender.    Assessment & Plan (Anokhi Shannon A. Madysen Faircloth MD; 05/13/2016 11:50 AM)  MASS OF RIGHT THIGH (R22.41) Impression: This more likely represents a large lipoma. It is not hard and fixed. It has come up rather quickly. I do not feel MRI necessary in this situation given its physical exam findings. She would like to have it removed. I discussed her risk of surgery as well as benefits and long-term expectations. Risk of bleeding, infection, nerve injury causing weakness to the leg, recurrence, the use of a drain, cosmetic deformity, wound complications requiring postoperative care, anesthesia risk, cardiovascular risks, DVT, and the need further treatments and/or procedures.  Current Plans Pt Education - [facility] Free Text Education/Instructions: discussed with patient and provided information. Pt Education - [facility] General Post-op HCI The pathophysiology of skin & subcutaneous masses was discussed. Natural history risks without surgery were discussed. I recommended surgery to remove the mass. I explained the technique of removal with use of local anesthesia & possible need for more aggressive sedation/anesthesia for patient comfort.  Risks such as bleeding, infection, wound breakdown, heart attack, death, and other risks were discussed. I noted a good likelihood this will help address the problem. Possibility that this will not correct all symptoms was explained. Possibility of regrowth/recurrence of the mass was discussed. We will work to minimize complications. Questions were answered. The patient expresses understanding & wishes to proceed with surgery.

## 2016-05-30 NOTE — Op Note (Signed)
Preoperative diagnosis: Anterior right thigh mass 15 cm x 12 cm  Postoperative diagnosis: Submuscular lipoma measuring 15 cm x 12 cm anterior thigh  Procedure: Resection of a submuscular right thigh lipoma  Surgeon: Erroll Luna M.D.  Anesthesia: LMA with local anesthetic  Specimen: Large multiloculated friable lipoma  Drains: 19 round drain to self submuscular space  Indications for procedure: The patient is a 72 year old female with a large rapidly expanding right thigh mass. On examination, this felt like a subcutaneous lipoma protruding mobile. We discussed options of observation versus resection. We discussed pros and cons preoperative MRI. By examination, did not appear to be fixed or MRI was not done.  Surgical risks discussed.  Risk of bleeding, infection, nerve injury, numbness of the thigh, weakness of muscles of the thigh and leg, recurrence, injury to major vascular structures of the right thigh, the use of a drain, DVT, cardiovascular events, death, and the need for other treatments and/or procedures discussed. She agreed to proceed.   Description of procedure: The patient was met in the holding area in the right anterior thigh mass marked as the correct side with the assistance of the patient.  She was taken back the operative room. She was placed upon the OR table supine. After induction of general anesthesia, the right  anterior thigh was prepped and draped in a sterile fashion. Timeout was done. She received preoperative antibiotics. 0.25% Marcaine was used as local anesthetic and injected into the skin and subcutaneous fat. The mass was at least 15 cm in transverse diameter. It was  Mobile and  soft. It  did not feel fixed to any underlying structures. A 10 cm transverse incision was made across the skin. Dissection was carried down to subcutaneous tissues. Every attempt at  preserving cutaneous  nerve branches was done . As I dissected down the sartorius muscle was encountered as  well as the vastus medialis and  Rectus femorus muscles. The mass was below the sartorius muscle. I was able to get into the plane between the sartorius muscle and the rectus femorus  muscles and vastus lateralis muscles. A large fatty mass was encountered in this space. This is the  space where the femoral nerve cutaneous branches were located. This mass extended medially up underneath the vastus medialis muscle and laterally under the vastus lateralis. Femoral artery could be palpated superiorly as well as vein medially. There is a large friable multiloculated fatty mass originating from this compartment. I carefully dissected all the fatty tissue away from any small vessels and/or nerves structures which were the cutaneous branches of the femoral nerve. The main nerve trunk was not encountered. This was  done carefully and  circumferentially. This was all done under direct vision. The mass was friable and difficult to handle and disintegrated into multiple fatty locules as it was removed. Once all fatty tissue was removed the compartment was examined. The cutaneous branches of the femoral nerve were preserved. The femoral vessels were preserved. The main trunk of the femoral nerve was preserved. Irrigation was used. Given the large cavity I  placed A 19 round drain INTO  the space. I reapproximated the musculature with 2-0 Vicryl. Care was taken to avoid the cutaneous branches the femoral nerve. I reapproximated the subcutaneous fatty tissue with 3-0 Vicryl and closed the skin with 4-0 Monocryl. Dermabond applied. All final counts found to be correct. Drain placed to bulb suction strictures secured to skin with 2-0 nylon. Patient was in awoke extubated taken to recovery  in satisfactory condition.

## 2016-05-30 NOTE — Anesthesia Preprocedure Evaluation (Signed)
Anesthesia Evaluation  Patient identified by MRN, date of birth, ID band Patient awake    Reviewed: Allergy & Precautions, NPO status , Patient's Chart, lab work & pertinent test results  Airway Mallampati: II  TM Distance: >3 FB Neck ROM: Full    Dental no notable dental hx.    Pulmonary neg pulmonary ROS, former smoker,    Pulmonary exam normal breath sounds clear to auscultation       Cardiovascular hypertension, Normal cardiovascular exam Rhythm:Regular Rate:Normal     Neuro/Psych negative neurological ROS  negative psych ROS   GI/Hepatic negative GI ROS, Neg liver ROS,   Endo/Other  negative endocrine ROS  Renal/GU negative Renal ROS  negative genitourinary   Musculoskeletal negative musculoskeletal ROS (+)   Abdominal   Peds negative pediatric ROS (+)  Hematology negative hematology ROS (+)   Anesthesia Other Findings   Reproductive/Obstetrics negative OB ROS                             Anesthesia Physical Anesthesia Plan  ASA: II  Anesthesia Plan: General   Post-op Pain Management:    Induction: Intravenous  Airway Management Planned: LMA  Additional Equipment:   Intra-op Plan:   Post-operative Plan:   Informed Consent: I have reviewed the patients History and Physical, chart, labs and discussed the procedure including the risks, benefits and alternatives for the proposed anesthesia with the patient or authorized representative who has indicated his/her understanding and acceptance.   Dental advisory given  Plan Discussed with: CRNA and Surgeon  Anesthesia Plan Comments:         Anesthesia Quick Evaluation

## 2016-05-30 NOTE — Discharge Instructions (Signed)
Surgical Drain Home Care °Surgical drains are used to remove extra fluid that normally builds up in a surgical wound after surgery. A surgical drain helps to heal a surgical wound. Different kinds of surgical drains include: °· Active drains. These drains use suction to pull drainage away from the surgical wound. Drainage flows through a tube to a container outside of the body. It is important to keep the bulb or the drainage container flat (compressed) at all times, except while you empty it. Flattening the bulb or container creates suction. The two most common types of active drains are bulb drains and Hemovac drains. °· Passive drains. These drains allow fluid to drain naturally, by gravity. Drainage flows through a tube to a bandage (dressing) or a container outside of the body. Passive drains do not need to be emptied. The most common type of passive drain is the Penrose drain. °A drain is placed during surgery. Immediately after surgery, drainage is usually bright red and a little thicker than water. The drainage may gradually turn yellow or pink and become thinner. It is likely that your health care provider will remove the drain when the drainage stops or when the amount decreases to 1-2 Tbsp (15-30 mL) during a 24-hour period. °How to care for your surgical drain °· Keep the skin around the drain dry and covered with a dressing at all times. °· Check your drain area every day for signs of infection. Check for: °¨ More redness, swelling, or pain. °¨ Pus or a bad smell. °¨ Cloudy drainage. °Follow instructions from your health care provider about how to take care of your drain and how to change your dressing. Change your dressing at least one time every day. Change it more often if needed to keep the dressing dry. Make sure you: °1. Gather your supplies, including: °¨ Tape. °¨ Germ-free cleaning solution (sterile saline). °¨ Split gauze drain sponge: 4 x 4 inches (10 x 10 cm). °¨ Gauze square: 4 x 4 inches  (10 x 10 cm). °2. Wash your hands with soap and water before you change your dressing. If soap and water are not available, use hand sanitizer. °3. Remove the old dressing. Avoid using scissors to do that. °4. Use sterile saline to clean your skin around the drain. °5. Place the tube through the slit in a drain sponge. Place the drain sponge so that it covers your wound. °6. Place the gauze square or another drain sponge on top of the drain sponge that is on the wound. Make sure the tube is between those layers. °7. Tape the dressing to your skin. °8. If you have an active bulb or Hemovac drain, tape the drainage tube to your skin 1-2 inches (2.5-5 cm) below the place where the tube enters your body. Taping keeps the tube from pulling on any stitches (sutures) that you have. °9. Wash your hands with soap and water. °10. Write down the color of your drainage and how often you change your dressing. °How to empty your active bulb or Hemovac drain °1. Make sure that you have a measuring cup that you can empty your drainage into. °2. Wash your hands with soap and water. If soap and water are not available, use hand sanitizer. °3. Gently move your fingers down the tube while squeezing very lightly. This is called stripping the tube. This clears any drainage, clots, or tissue from the tube. °¨ Do not pull on the tube. °¨ You may need to strip the tube   several times every day to keep the tube clear. 4. Open the bulb cap or the drain plug. Do not touch the inside of the cap or the bottom of the plug. 5. Empty all of the drainage into the measuring cup. 6. Compress the bulb or the container and replace the cap or the plug. To compress the bulb or the container, squeeze it firmly in the middle while you close the cap or plug the container. 7. Write down the amount of drainage that you have in each 24-hour period. If you have less than 2 Tbsp (30 mL) of drainage during 24 hours, contact your health care provider. 8. Flush  the drainage down the toilet. 9. Wash your hands with soap and water. Contact a health care provider if:  You have more redness, swelling, or pain around your drain area.  The amount of drainage that you have is increasing instead of decreasing.  You have pus or a bad smell coming from your drain area.  You have a fever.  You have drainage that is cloudy.  There is a sudden stop or a sudden decrease in the amount of drainage that you have.  Your tube falls out.  Your active draindoes not stay compressedafter you empty it. This information is not intended to replace advice given to you by your health care provider. Make sure you discuss any questions you have with your health care provider. Document Released: 02/16/2000 Document Revised: 07/27/2015 Document Reviewed: 09/07/2014 Elsevier Interactive Patient Education  2017 [place] packs   Expect some numbness over the anterior thigh  Ok to walk    Call office for follow up in 10 -14 days      Ok to shower   empty drain daily

## 2016-05-31 ENCOUNTER — Emergency Department (HOSPITAL_COMMUNITY)
Admission: EM | Admit: 2016-05-31 | Discharge: 2016-05-31 | Disposition: A | Discharge status: Home / Self Care | Payer: Medicare Other | Attending: Emergency Medicine | Admitting: Emergency Medicine

## 2016-05-31 ENCOUNTER — Encounter (HOSPITAL_COMMUNITY): Payer: Self-pay | Admitting: Emergency Medicine

## 2016-05-31 DIAGNOSIS — Z4889 Encounter for other specified surgical aftercare: Secondary | ICD-10-CM

## 2016-05-31 DIAGNOSIS — T8189XA Other complications of procedures, not elsewhere classified, initial encounter: Secondary | ICD-10-CM | POA: Insufficient documentation

## 2016-05-31 DIAGNOSIS — Z87891 Personal history of nicotine dependence: Secondary | ICD-10-CM | POA: Insufficient documentation

## 2016-05-31 DIAGNOSIS — Y638 Failure in dosage during other surgical and medical care: Secondary | ICD-10-CM | POA: Diagnosis not present

## 2016-05-31 DIAGNOSIS — I10 Essential (primary) hypertension: Secondary | ICD-10-CM | POA: Insufficient documentation

## 2016-05-31 NOTE — ED Triage Notes (Signed)
Pt reports she had a fatty tumor removed and JP drain in place, pt states drain came dislodged this morning and bright red blood is present around site and in drain.

## 2016-05-31 NOTE — Discharge Instructions (Signed)
Please strip the JP drain tube regularly to help decrease risk of clogging.  Followup with your surgeon as previously schedule

## 2016-05-31 NOTE — ED Notes (Signed)
Dressed pt JP drain with Biopatch, nonadhesive, and Tegaderm.  Pt is comfortable.  JP drain output 55mL.

## 2016-05-31 NOTE — ED Provider Notes (Signed)
  Face-to-face evaluation   History: She presents for evaluation of nonfunctional JP drain, right thigh, following removal of large lipoma from right groin.  He is able to walk.  She denies dizziness.  Physical exam: Alert, cooperative.  JP drain right anterior proximal thigh is draining blood color fluid, not bright red.  No purulent drainage from JP site.  Medical screening examination/treatment/procedure(s) were conducted as a shared visit with non-physician practitioner(s) and myself.  I personally evaluated the patient during the encounter   Daleen Bo, MD 05/31/16 [phone]

## 2016-05-31 NOTE — ED Provider Notes (Signed)
[facility] DEPT Provider Note   CSN: [id number] Arrival date & time: 05/31/16  0903     History   Chief Complaint Chief Complaint  Patient presents with  . Post-op Problem    HPI [name] is a 71 y.o. female.  HPI   71 year old female presents for evaluation of a clogged JP drain. Patient report she recently had a fatty tumor removed yesterday.  She was discharge with a f/u appointment in 2 weeks.  This morning she notice blood oozing around the JP site and became concern. She denies increasing pain, fever, or redness.  No other complaint.    Past Medical History:  Diagnosis Date  . Cervical dysplasia   . Endometrial hyperplasia   . Fibroid    Atypical leiomyoma  . History of colon polyps   . Hyperglycemia 05/23/2014  . Hypertension     Patient Active Problem List   Diagnosis Date Noted  . Preventative health care 09/25/2014  . Vitamin D deficiency 05/24/2014  . Hyperglycemia 05/23/2014  . Hypertriglyceridemia 05/23/2014  . History of colon polyps 05/23/2014  . Essential hypertension, benign 04/06/2014  . Fibroid     Past Surgical History:  Procedure Laterality Date  . ABDOMINAL HYSTERECTOMY  2004   TAH_BSO  . BREAST BIOPSY Left 2000  . BREAST BIOPSY Bilateral 1999  . BREAST SURGERY  1998   Benign left breast lump  . COLPOSCOPY    . HEMORRHOID SURGERY  1980's  . LYMPH NODE BIOPSY Left 1975   ? "removed lymph nodes in left leg"--benign per pt  . OOPHORECTOMY     BSO  . TONSILLECTOMY  1990    OB History    Gravida Para Term Preterm AB Living   1 1 1     1    SAB TAB Ectopic Multiple Live Births                   Home Medications    Prior to Admission medications   Medication Sig Start Date End Date Taking? Authorizing Provider  acetaminophen (TYLENOL) 500 MG tablet Take 1,000 mg by mouth daily as needed for moderate pain or headache.    Historical Provider, MD  Calcium Carbonate-Vitamin D (CALCIUM + D PO) Take 2 tablets by mouth daily.      Historical Provider, MD  estradiol (ESTRACE) 0.5 MG tablet TAKE 1 TABLET(0.5 MG) BY MOUTH DAILY 04/30/16   Huel Cote, NP  glucosamine-chondroitin 500-400 MG tablet Take 2 tablets by mouth daily.     Historical Provider, MD  metoprolol (LOPRESSOR) 50 MG tablet Take 1 tablet (50 mg total) by mouth 3 (three) times daily. 04/02/16   Debbrah Alar, NP  Multiple Vitamin (MULTIVITAMIN) tablet Take 1 tablet by mouth daily.      Historical Provider, MD  Multiple Vitamins-Minerals (HAIR SKIN AND NAILS FORMULA) TABS Take 2 tablets by mouth daily.    Historical Provider, MD  Omega-3 Fatty Acids ([employer] FISH OIL) 1000 MG CAPS Take 2,000 capsules by mouth 2 (two) times daily. Patient taking differently: Take 2,000 capsules by mouth daily.  09/19/14   Debbrah Alar, NP  oxyCODONE (OXY IR/ROXICODONE) 5 MG immediate release tablet Take 1-2 tablets (5-10 mg total) by mouth every 6 (six) hours as needed for severe pain. 05/30/16   Erroll Luna, MD  telmisartan-hydrochlorothiazide (MICARDIS HCT) 80-25 MG tablet Take 1 tablet by mouth daily. 05/07/16   Debbrah Alar, NP  vitamin C (ASCORBIC ACID) 500 MG tablet Take 500 mg by mouth  daily.    Historical Provider, MD    Family History Family History  Problem Relation Age of Onset  . Hypertension Mother   . Heart disease Mother   . Diabetes Mother   . Breast cancer Mother 27  . Heart disease Father   . Cancer Father     prostate  . Cancer Sister 72    Leukemia, deceased  . Breast cancer Maternal Aunt     over 70  . Breast cancer Maternal Grandmother     over 8  . Hypertension Brother   . Diabetes Brother   . Stroke Brother     Social History Social History  Substance Use Topics  . Smoking status: Former Smoker    Packs/day: 0.25    Years: 30.00    Types: Cigarettes    Quit date: 2013  . Smokeless tobacco: Never Used  . Alcohol use 0.0 oz/week     Comment: 2-4 glasses of wine on weekends     Allergies   Atorvastatin;  Fenofibrate; Hydralazine; and Potassium-containing compounds   Review of Systems Review of Systems  Constitutional: Negative for fever.  Skin: Positive for wound.  Neurological: Negative for numbness.     Physical Exam Updated Vital Signs BP (!) 139/91 (BP Location: Right Arm)   Pulse 71   Temp 98.1 F (36.7 C) (Oral)   Resp 14   LMP 03/04/2002   SpO2 97%   Physical Exam  Constitutional: She appears well-developed and well-nourished. No distress.  HENT:  Head: Atraumatic.  Eyes: Conjunctivae are normal.  Neck: Neck supple.  Neurological: She is alert.  Skin: No rash noted.  R thigh: JP drain to R anterior thigh appears in place, small amount of serous sanguinous discharge around the site without skin erythema or edema, mild tenderness to palpation.  JP tube appeared clogged.  Serous sanguinous fluid noted in JP bulb.    Psychiatric: She has a normal mood and affect.  Nursing note and vitals reviewed.    ED Treatments / Results  Labs (all labs ordered are listed, but only abnormal results are displayed) Labs Reviewed - No data to display  EKG  EKG Interpretation None       Radiology No results found.  Procedures Procedures (including critical care time)  Medications Ordered in ED Medications - No data to display   Initial Impression / Assessment and Plan / ED Course  I have reviewed the triage vital signs and the nursing notes.  Pertinent labs & imaging results that were available during my care of the patient were reviewed by me and considered in my medical decision making (see chart for details).     BP (!) 139/91 (BP Location: Right Arm)   Pulse 71   Temp 98.1 F (36.7 C) (Oral)   Resp 14   LMP 03/04/2002   SpO2 97%    Final Clinical Impressions(s) / ED Diagnoses   Final diagnoses:  Encounter for post surgical wound check    New Prescriptions New Prescriptions   No medications on file   9:24 AM Patient has an anterior right thigh  mass measuring 15 cm x 12 cm that was resected yesterday by Dr. Brantley Stage. She was diagnosed with a submuscular lipoma.  A JP drain was placed and now it is clogged.    I was able to strip the tube and help aid with drainage.  I instruct pt on appropriate tube stripping to reduce risk of clogging.  Return precaution discussed.  No  evidence of tube displacement or sign of infection noted on exam.  Pt voice understanding and agrees with plan.     Domenic Moras, PA-C 05/31/16 [place], MD 05/31/16 [phone]

## 2016-07-05 ENCOUNTER — Other Ambulatory Visit: Payer: Self-pay | Admitting: Women's Health

## 2016-07-05 DIAGNOSIS — Z79890 Hormone replacement therapy (postmenopausal): Secondary | ICD-10-CM

## 2016-07-17 ENCOUNTER — Encounter: Payer: Self-pay | Admitting: Gynecology

## 2016-08-05 NOTE — Addendum Note (Signed)
Addendum  created 08/05/16 1248 by Myrtie Soman, MD   Sign clinical note

## 2016-09-19 NOTE — Progress Notes (Addendum)
Subjective:   [name] is a 71 y.o. female who presents for Medicare Annual (Subsequent) preventive examination.  Review of Systems:  No ROS.  Medicare Wellness Visit. Additional risk factors are reflected in the social history.  Cardiac Risk Factors include: advanced age (>6men, >66 women);hypertension Sleep patterns: Sleeps 6-7 hrs per night. Feels rested.  Home Safety/Smoke Alarms: Feels safe in home. Smoke alarms in place.  Living environment; residence and Firearm Safety: Lives with husband in 2 story home. No issues with stairs. No guns. Seat Belt Safety/Bike Helmet: Wears seat belt.   Counseling:   Eye Exam- Wears glasses for distance/ driving. Dr.Frazier yearly. Dental- Dr.Wilson every 6 months.  Female:   Pap- Hysterectomy      Mammo-  Last 04/16/16:  BI-RADS CATEGORY  1: Negative. Dexa scan-   Last    09/21/14: normal. ORDERED TODAY [facility]- Last 04/14/13: per pt- benign polyps removed     Objective:     Vitals: BP 138/72 (BP Location: Left Arm, Patient Position: Sitting, Cuff Size: Normal)   Pulse 62   Ht 5\' 4"  (1.626 m)   Wt 168 lb 6.4 oz (76.4 kg)   LMP 03/04/2002   SpO2 98%   BMI 28.91 kg/m   Body mass index is 28.91 kg/m.   Tobacco History  Smoking Status  . Former Smoker  . Packs/day: 0.25  . Years: 30.00  . Types: Cigarettes  . Quit date: 2013  Smokeless Tobacco  . Never Used     Counseling given: Not Answered   Past Medical History:  Diagnosis Date  . Cervical dysplasia   . Endometrial hyperplasia   . Fibroid    Atypical leiomyoma  . History of colon polyps   . Hyperglycemia 05/23/2014  . Hypertension    Past Surgical History:  Procedure Laterality Date  . ABDOMINAL HYSTERECTOMY  2004   TAH_BSO  . BREAST BIOPSY Left 2000  . BREAST BIOPSY Bilateral 1999  . BREAST SURGERY  1998   Benign left breast lump  . COLPOSCOPY    . EXCISION MASS LOWER EXTREMETIES Right 05/30/2016   Procedure: EXCISION RIGHT THIGH MASS;  Surgeon: Erroll Luna, MD;  Location: [facility];  Service: General;  Laterality: Right;  EXCISION RIGHT THIGH MASS  . HEMORRHOID SURGERY  1980's  . LYMPH NODE BIOPSY Left 1975   ? "removed lymph nodes in left leg"--benign per pt  . OOPHORECTOMY     BSO  . TONSILLECTOMY  1990   Family History  Problem Relation Age of Onset  . Hypertension Mother   . Heart disease Mother   . Diabetes Mother   . Breast cancer Mother 66  . Heart disease Father   . Cancer Father        prostate  . Cancer Sister 17       Leukemia, deceased  . Breast cancer Maternal Aunt        over 67  . Breast cancer Maternal Grandmother        over 65  . Hypertension Brother   . Diabetes Brother   . Stroke Brother    History  Sexual Activity  . Sexual activity: Yes  . Birth control/ protection: Surgical    Outpatient Encounter Prescriptions as of 09/23/2016  Medication Sig  . acetaminophen (TYLENOL) 500 MG tablet Take 1,000 mg by mouth daily as needed for moderate pain or headache.  . Calcium Carbonate-Vitamin D (CALCIUM + D PO) Take 2 tablets by mouth daily.   Marland Kitchen  estradiol (ESTRACE) 0.5 MG tablet TAKE 1 TABLET(0.5 MG) BY MOUTH DAILY  . glucosamine-chondroitin 500-400 MG tablet Take 2 tablets by mouth daily.   . metoprolol (LOPRESSOR) 50 MG tablet Take 1 tablet (50 mg total) by mouth 3 (three) times daily. (Patient taking differently: Take 50 mg by mouth 2 (two) times daily. )  . Multiple Vitamin (MULTIVITAMIN) tablet Take 1 tablet by mouth daily.    . Multiple Vitamins-Minerals (HAIR SKIN AND NAILS FORMULA) TABS Take 2 tablets by mouth daily.  . Omega-3 Fatty Acids ([employer] FISH OIL) 1000 MG CAPS Take 2,000 capsules by mouth 2 (two) times daily. (Patient taking differently: Take 2,000 capsules by mouth daily. )  . telmisartan-hydrochlorothiazide (MICARDIS HCT) 80-25 MG tablet Take 1 tablet by mouth daily.  . vitamin C (ASCORBIC ACID) 500 MG tablet Take 500 mg by mouth daily.  . [DISCONTINUED] estradiol (ESTRACE) 0.5 MG tablet TAKE  ONE TABLET BY MOUTH DAILY  . [DISCONTINUED] oxyCODONE (OXY IR/ROXICODONE) 5 MG immediate release tablet Take 1-2 tablets (5-10 mg total) by mouth every 6 (six) hours as needed for severe pain. (Patient not taking: Reported on 09/23/2016)   No facility-administered encounter medications on file as of 09/23/2016.     Activities of Daily Living In your present state of health, do you have any difficulty performing the following activities: 09/23/2016 05/24/2016  Hearing? N -  Vision? N -  Difficulty concentrating or making decisions? N -  Walking or climbing stairs? N Y  Dressing or bathing? N -  Doing errands, shopping? N -  Preparing Food and eating ? N -  Using the Toilet? N -  In the past six months, have you accidently leaked urine? N -  Do you have problems with loss of bowel control? N -  Managing your Medications? N -  Managing your Finances? N -  Housekeeping or managing your Housekeeping? N -  Some recent data might be hidden    Patient Care Team: Debbrah Alar, NP as PCP - General (Internal Medicine) Juanita Craver, MD as Consulting Physician (Gastroenterology) Huel Cote, NP as Nurse Practitioner (Obstetrics and Gynecology) Charolotte Capuchin, MD as Consulting Physician (Dentistry)    Assessment:    Physical assessment deferred to PCP.  Exercise Activities and Dietary recommendations Current Exercise Habits: Home exercise routine, Type of exercise: walking, Time (Minutes): 30, Frequency (Times/Week): 3, Weekly Exercise (Minutes/Week): 90   Diet (meal preparation, eat out, water intake, caffeinated beverages, dairy products, fruits and vegetables): in general, a "healthy" diet   Breakfast: Eggs, bacon, cantaloupe. 2 cups of coffee Lunch: snack Dinner:  Meat and vegetables Drinks at least 8 glasses of water per day.   Goals    . Patient Stated          Decrease stress through reading, crafts, social time with friends. Increase physical activity as time allows.  Maintain current state of health/no declines in health over the next year.      Fall Risk Fall Risk  09/23/2016 09/22/2015 09/19/2014 09/19/2014  Falls in the past year? No No No No   Depression Screen PHQ 2/9 Scores 09/23/2016 09/22/2015 09/19/2014 09/19/2014  PHQ - 2 Score 0 1 0 0     Cognitive Function Ad8 score reviewed for issues:  Issues making decisions:no  Less interest in hobbies / activities:no  Repeats questions, stories (family complaining):no  Trouble using ordinary gadgets (microwave, computer, phone):no  Forgets the month or year: no  Mismanaging finances: no  Remembering appts:no  Daily problems with  thinking and/or memory:no Ad8 score is=0     MMSE - Mini Mental State Exam 09/22/2015  Orientation to time 5  Orientation to Place 5  Registration 3  Attention/ Calculation 5  Recall 3  Language- name 2 objects 2  Language- repeat 1  Language- follow 3 step command 3  Language- read & follow direction 1  Write a sentence 1  Copy design 1  Total score 30        Immunization History  Administered Date(s) Administered  . Td 09/19/2014  . Zoster 03/04/2012, 04/30/2014   Screening Tests Health Maintenance  Topic Date Due  . Hepatitis C Screening  March 01, 1946  . PNA vac Low Risk Adult (1 of 2 - PCV13) 01/17/2013  . INFLUENZA VACCINE  05/23/2019 (Originally 10/02/2016)  . COLONOSCOPY  04/14/2018  . MAMMOGRAM  04/16/2018  . TETANUS/TDAP  09/18/2024  . DEXA SCAN  Completed      Plan:   Follow up with Debbrah Alar, NP as scheduled.  Continue to eat heart healthy diet (full of fruits, vegetables, whole grains, lean protein, water--limit salt, fat, and sugar intake) and increase physical activity as tolerated.  Continue doing brain stimulating activities (puzzles, reading, adult coloring books, staying active) to keep memory sharp.   I ordered your bone density scan today. They will call you to schedule.    I have personally reviewed and  noted the following in the patient's chart:   . Medical and social history . Use of alcohol, tobacco or illicit drugs  . Current medications and supplements . Functional ability and status . Nutritional status . Physical activity . Advanced directives . List of other physicians . Hospitalizations, surgeries, and ER visits in previous 12 months . Vitals . Screenings to include cognitive, depression, and falls . Referrals and appointments  In addition, I have reviewed and discussed with patient certain preventive protocols, quality metrics, and best practice recommendations. A written personalized care plan for preventive services as well as general preventive health recommendations were provided to patient.     Naaman Plummer [place], [place]  09/23/2016

## 2016-09-23 ENCOUNTER — Encounter: Payer: Self-pay | Admitting: *Deleted

## 2016-09-23 ENCOUNTER — Ambulatory Visit (INDEPENDENT_AMBULATORY_CARE_PROVIDER_SITE_OTHER): Payer: Medicare Other | Admitting: *Deleted

## 2016-09-23 ENCOUNTER — Ambulatory Visit: Payer: Medicare Other | Admitting: *Deleted

## 2016-09-23 VITALS — BP 138/72 | HR 62 | Ht 64.0 in | Wt 168.4 lb

## 2016-09-23 DIAGNOSIS — Z0000 Encounter for general adult medical examination without abnormal findings: Secondary | ICD-10-CM

## 2016-09-23 DIAGNOSIS — Z780 Asymptomatic menopausal state: Secondary | ICD-10-CM

## 2016-09-23 NOTE — Progress Notes (Signed)
Reviewed and agree.

## 2016-09-23 NOTE — Patient Instructions (Signed)
[name] , Thank you for taking time to come for your Medicare Wellness Visit. I appreciate your ongoing commitment to your health goals. Please review the following plan we discussed and let me know if I can assist you in the future.   These are the goals we discussed: Goals    . Patient Stated          Decrease stress through reading, crafts, social time with friends. Increase physical activity as time allows. Maintain current state of health/no declines in health over the next year.       This is a list of the screening recommended for you and due dates:  Health Maintenance  Topic Date Due  .  Hepatitis C: One time screening is recommended by Center for Disease Control  (CDC) for  adults born from [birth year] through 1963.   25-May-1945  . Pneumonia vaccines (1 of 2 - PCV13) 01/17/2013  . Flu Shot  05/23/2019*  . Colon Cancer Screening  04/14/2018  . Mammogram  04/16/2018  . Tetanus Vaccine  09/18/2024  . DEXA scan (bone density measurement)  Completed  *Topic was postponed. The date shown is not the original due date.    Follow up with Debbrah Alar, NP as scheduled.  Continue to eat heart healthy diet (full of fruits, vegetables, whole grains, lean protein, water--limit salt, fat, and sugar intake) and increase physical activity as tolerated.  Continue doing brain stimulating activities (puzzles, reading, adult coloring books, staying active) to keep memory sharp.   I ordered your bone density scan today. They will call you to schedule.   Health Maintenance for Postmenopausal Women Menopause is a normal process in which your reproductive ability comes to an end. This process happens gradually over a span of months to years, usually between the ages of 73 and 83. Menopause is complete when you have missed 12 consecutive menstrual periods. It is important to talk with your health care provider about some of the most common conditions that affect postmenopausal women, such as  heart disease, cancer, and bone loss (osteoporosis). Adopting a healthy lifestyle and getting preventive care can help to promote your health and wellness. Those actions can also lower your chances of developing some of these common conditions. What should I know about menopause? During menopause, you may experience a number of symptoms, such as:  Moderate-to-severe hot flashes.  Night sweats.  Decrease in sex drive.  Mood swings.  Headaches.  Tiredness.  Irritability.  Memory problems.  Insomnia.  Choosing to treat or not to treat menopausal changes is an individual decision that you make with your health care provider. What should I know about hormone replacement therapy and supplements? Hormone therapy products are effective for treating symptoms that are associated with menopause, such as hot flashes and night sweats. Hormone replacement carries certain risks, especially as you become older. If you are thinking about using estrogen or estrogen with progestin treatments, discuss the benefits and risks with your health care provider. What should I know about heart disease and stroke? Heart disease, heart attack, and stroke become more likely as you age. This may be due, in part, to the hormonal changes that your body experiences during menopause. These can affect how your body processes dietary fats, triglycerides, and cholesterol. Heart attack and stroke are both medical emergencies. There are many things that you can do to help prevent heart disease and stroke:  Have your blood pressure checked at least every 1-2 years. High blood pressure causes  heart disease and increases the risk of stroke.  If you are 60-17 years old, ask your health care provider if you should take aspirin to prevent a heart attack or a stroke.  Do not use any tobacco products, including cigarettes, chewing tobacco, or electronic cigarettes. If you need help quitting, ask your health care provider.  It is  important to eat a healthy diet and maintain a healthy weight. ? Be sure to include plenty of vegetables, fruits, low-fat dairy products, and lean protein. ? Avoid eating foods that are high in solid fats, added sugars, or salt (sodium).  Get regular exercise. This is one of the most important things that you can do for your health. ? Try to exercise for at least 150 minutes each week. The type of exercise that you do should increase your heart rate and make you sweat. This is known as moderate-intensity exercise. ? Try to do strengthening exercises at least twice each week. Do these in addition to the moderate-intensity exercise.  Know your numbers.Ask your health care provider to check your cholesterol and your blood glucose. Continue to have your blood tested as directed by your health care provider.  What should I know about cancer screening? There are several types of cancer. Take the following steps to reduce your risk and to catch any cancer development as early as possible. Breast Cancer  Practice breast self-awareness. ? This means understanding how your breasts normally appear and feel. ? It also means doing regular breast self-exams. Let your health care provider know about any changes, no matter how small.  If you are 61 or older, have a clinician do a breast exam (clinical breast exam or CBE) every year. Depending on your age, family history, and medical history, it may be recommended that you also have a yearly breast X-ray (mammogram).  If you have a family history of breast cancer, talk with your health care provider about genetic screening.  If you are at high risk for breast cancer, talk with your health care provider about having an MRI and a mammogram every year.  Breast cancer (BRCA) gene test is recommended for women who have family members with BRCA-related cancers. Results of the assessment will determine the need for genetic counseling and BRCA1 and for BRCA2  testing. BRCA-related cancers include these types: ? Breast. This occurs in males or females. ? Ovarian. ? Tubal. This may also be called fallopian tube cancer. ? Cancer of the abdominal or pelvic lining (peritoneal cancer). ? Prostate. ? Pancreatic.  Cervical, Uterine, and Ovarian Cancer Your health care provider may recommend that you be screened regularly for cancer of the pelvic organs. These include your ovaries, uterus, and vagina. This screening involves a pelvic exam, which includes checking for microscopic changes to the surface of your cervix (Pap test).  For women ages 21-65, health care providers may recommend a pelvic exam and a Pap test every three years. For women ages 24-65, they may recommend the Pap test and pelvic exam, combined with testing for human papilloma virus (HPV), every five years. Some types of HPV increase your risk of cervical cancer. Testing for HPV may also be done on women of any age who have unclear Pap test results.  Other health care providers may not recommend any screening for nonpregnant women who are considered low risk for pelvic cancer and have no symptoms. Ask your health care provider if a screening pelvic exam is right for you.  If you have had past  treatment for cervical cancer or a condition that could lead to cancer, you need Pap tests and screening for cancer for at least 20 years after your treatment. If Pap tests have been discontinued for you, your risk factors (such as having a new sexual partner) need to be reassessed to determine if you should start having screenings again. Some women have medical problems that increase the chance of getting cervical cancer. In these cases, your health care provider may recommend that you have screening and Pap tests more often.  If you have a family history of uterine cancer or ovarian cancer, talk with your health care provider about genetic screening.  If you have vaginal bleeding after reaching  menopause, tell your health care provider.  There are currently no reliable tests available to screen for ovarian cancer.  Lung Cancer Lung cancer screening is recommended for adults 30-40 years old who are at high risk for lung cancer because of a history of smoking. A yearly low-dose CT scan of the lungs is recommended if you:  Currently smoke.  Have a history of at least 30 pack-years of smoking and you currently smoke or have quit within the past 15 years. A pack-year is smoking an average of one pack of cigarettes per day for one year.  Yearly screening should:  Continue until it has been 15 years since you quit.  Stop if you develop a health problem that would prevent you from having lung cancer treatment.  Colorectal Cancer  This type of cancer can be detected and can often be prevented.  Routine colorectal cancer screening usually begins at age 68 and continues through age 48.  If you have risk factors for colon cancer, your health care provider may recommend that you be screened at an earlier age.  If you have a family history of colorectal cancer, talk with your health care provider about genetic screening.  Your health care provider may also recommend using home test kits to check for hidden blood in your stool.  A small camera at the end of a tube can be used to examine your colon directly (sigmoidoscopy or colonoscopy). This is done to check for the earliest forms of colorectal cancer.  Direct examination of the colon should be repeated every 5-10 years until age 75. However, if early forms of precancerous polyps or small growths are found or if you have a family history or genetic risk for colorectal cancer, you may need to be screened more often.  Skin Cancer  Check your skin from head to toe regularly.  Monitor any moles. Be sure to tell your health care provider: ? About any new moles or changes in moles, especially if there is a change in a mole's shape or  color. ? If you have a mole that is larger than the size of a pencil eraser.  If any of your family members has a history of skin cancer, especially at a young age, talk with your health care provider about genetic screening.  Always use sunscreen. Apply sunscreen liberally and repeatedly throughout the day.  Whenever you are outside, protect yourself by wearing long sleeves, pants, a wide-brimmed hat, and sunglasses.  What should I know about osteoporosis? Osteoporosis is a condition in which bone destruction happens more quickly than new bone creation. After menopause, you may be at an increased risk for osteoporosis. To help prevent osteoporosis or the bone fractures that can happen because of osteoporosis, the following is recommended:  If you are  78-75 years old, get at least 1,000 mg of calcium and at least 600 mg of vitamin D per day.  If you are older than age 19 but younger than age 65, get at least 1,200 mg of calcium and at least 600 mg of vitamin D per day.  If you are older than age 69, get at least 1,200 mg of calcium and at least 800 mg of vitamin D per day.  Smoking and excessive alcohol intake increase the risk of osteoporosis. Eat foods that are rich in calcium and vitamin D, and do weight-bearing exercises several times each week as directed by your health care provider. What should I know about how menopause affects my mental health? Depression may occur at any age, but it is more common as you become older. Common symptoms of depression include:  Low or sad mood.  Changes in sleep patterns.  Changes in appetite or eating patterns.  Feeling an overall lack of motivation or enjoyment of activities that you previously enjoyed.  Frequent crying spells.  Talk with your health care provider if you think that you are experiencing depression. What should I know about immunizations? It is important that you get and maintain your immunizations. These include:  Tetanus,  diphtheria, and pertussis (Tdap) booster vaccine.  Influenza every year before the flu season begins.  Pneumonia vaccine.  Shingles vaccine.  Your health care provider may also recommend other immunizations. This information is not intended to replace advice given to you by your health care provider. Make sure you discuss any questions you have with your health care provider. Document Released: 04/12/2005 Document Revised: 09/08/2015 Document Reviewed: 11/22/2014 Elsevier Interactive Patient Education  2018 [employer].

## 2016-09-25 ENCOUNTER — Ambulatory Visit (INDEPENDENT_AMBULATORY_CARE_PROVIDER_SITE_OTHER): Payer: Medicare Other | Admitting: Family

## 2016-09-25 VITALS — BP 158/90 | HR 67 | Temp 98.2°F | Resp 16 | Ht 64.0 in

## 2016-09-25 DIAGNOSIS — R739 Hyperglycemia, unspecified: Secondary | ICD-10-CM

## 2016-09-25 DIAGNOSIS — E559 Vitamin D deficiency, unspecified: Secondary | ICD-10-CM | POA: Diagnosis not present

## 2016-09-25 DIAGNOSIS — I10 Essential (primary) hypertension: Secondary | ICD-10-CM

## 2016-09-25 DIAGNOSIS — E781 Pure hyperglyceridemia: Secondary | ICD-10-CM | POA: Diagnosis not present

## 2016-09-25 MED ORDER — METOPROLOL TARTRATE 50 MG PO TABS: 50.0000 mg | ORAL_TABLET | Freq: Two times a day (BID) | ORAL | 1 refills | Status: DC

## 2016-09-25 MED ORDER — TELMISARTAN-HCTZ 80-25 MG PO TABS: 1.0000 | ORAL_TABLET | Freq: Every day | ORAL | 1 refills | Status: DC

## 2016-09-25 NOTE — Patient Instructions (Signed)
Follow up in 3 months, come fasting to your next appointment.

## 2016-09-25 NOTE — Progress Notes (Signed)
Subjective:    Patient ID: [name], female    DOB: Nov 29, 1945, 71 y.o.   MRN: [id number]  HPI  [name] is a 71 yr old female who presents today for follow up.  HTN- current blood pressure medications include metoprolol 50 mg by mouth 2 times daily, Micardis HCT 80-25.  BP Readings from Last 3 Encounters:  09/25/16 (!) 158/90  09/23/16 138/72  05/31/16 130/81   Hypertriglyceridemia-  Lab Results  Component Value Date   CHOL 219 (H) 09/22/2015   HDL 61.60 09/22/2015   LDLDIRECT 115.0 09/22/2015   TRIG 326.0 (H) 09/22/2015   CHOLHDL 4 09/22/2015   Vit D deficiency- on caltrate + D  Takes 1/2 tab of iron once daily.    Review of Systems See HPI  Past Medical History:  Diagnosis Date  . Cervical dysplasia   . Endometrial hyperplasia   . Fibroid    Atypical leiomyoma  . History of colon polyps   . Hyperglycemia 05/23/2014  . Hypertension      Social History   Social History  . Marital status: Married    Spouse name: N/A  . Number of children: N/A  . Years of education: N/A   Occupational History  . Not on file.   Social History Main Topics  . Smoking status: Former Smoker    Packs/day: 0.25    Years: 30.00    Types: Cigarettes    Quit date: 2013  . Smokeless tobacco: Never Used  . Alcohol use 0.0 oz/week     Comment: 2-4 glasses of wine on weekends  . Drug use: No  . Sexual activity: Yes    Birth control/ protection: Surgical   Other Topics Concern  . 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   Enjoys reading, yard work, cares for her mother during the day, her brother helps during then night, enjoys poker   1 daughter (in [place]) and 2 grand-daughters (In [place]- age 51 and 6)    Past Surgical History:  Procedure Laterality Date  . ABDOMINAL HYSTERECTOMY  2004   TAH_BSO  . BREAST  BIOPSY Left 2000  . BREAST BIOPSY Bilateral 1999  . BREAST SURGERY  1998   Benign left breast lump  . COLPOSCOPY    . EXCISION MASS LOWER EXTREMETIES Right 05/30/2016   Procedure: EXCISION RIGHT THIGH MASS;  Surgeon: Erroll Luna, MD;  Location: [facility];  Service: General;  Laterality: Right;  EXCISION RIGHT THIGH MASS  . HEMORRHOID SURGERY  1980's  . LYMPH NODE BIOPSY Left 1975   ? "removed lymph nodes in left leg"--benign per pt  . OOPHORECTOMY     BSO  . TONSILLECTOMY  1990    Family History  Problem Relation Age of Onset  . Hypertension Mother   . Heart disease Mother   . Diabetes Mother   . Breast cancer Mother 67  . Heart disease Father   . Cancer Father        prostate  . Cancer Sister 37       Leukemia, deceased  . Breast cancer Maternal Aunt        over 8  . Breast cancer Maternal Grandmother        over 27  . Hypertension Brother   . Diabetes Brother   .  Stroke Brother     Allergies  Allergen Reactions  . Atorvastatin     Muscle cramps  . Fenofibrate     "Locked her muscles up"  . Hydralazine     "kidney pain"  . Potassium-Containing Compounds Other (See Comments)    Burning  sensation in kidney  area    Current Outpatient Prescriptions on File Prior to Visit  Medication Sig Dispense Refill  . acetaminophen (TYLENOL) 500 MG tablet Take 1,000 mg by mouth daily as needed for moderate pain or headache.    . Calcium Carbonate-Vitamin D (CALCIUM + D PO) Take 2 tablets by mouth daily.     Marland Kitchen estradiol (ESTRACE) 0.5 MG tablet TAKE 1 TABLET(0.5 MG) BY MOUTH DAILY 90 tablet 3  . glucosamine-chondroitin 500-400 MG tablet Take 2 tablets by mouth daily.     . IRON PO Take by mouth.    . metoprolol (LOPRESSOR) 50 MG tablet Take 1 tablet (50 mg total) by mouth 3 (three) times daily. (Patient taking differently: Take 50 mg by mouth 2 (two) times daily. ) 90 tablet 2  . Multiple Vitamin (MULTIVITAMIN) tablet Take 1 tablet by mouth daily.      . Multiple  Vitamins-Minerals (HAIR SKIN AND NAILS FORMULA) TABS Take 2 tablets by mouth daily.    . Omega-3 Fatty Acids ([employer] FISH OIL) 1000 MG CAPS Take 2,000 capsules by mouth 2 (two) times daily. (Patient taking differently: Take 2,000 capsules by mouth daily. ) 90 capsule   . telmisartan-hydrochlorothiazide (MICARDIS HCT) 80-25 MG tablet Take 1 tablet by mouth daily. 30 tablet 5  . vitamin C (ASCORBIC ACID) 500 MG tablet Take 500 mg by mouth daily.     No current facility-administered medications on file prior to visit.     BP (!) 158/90   Pulse 67   Temp 98.2 F (36.8 C) (Oral)   Resp 16   Ht 5\' 4"  (1.626 m)   LMP 03/04/2002   SpO2 100%       Objective:   Physical Exam  Constitutional: She is oriented to person, place, and time. She appears well-developed and well-nourished.  HENT:  Head: Normocephalic and atraumatic.  Cardiovascular: Normal rate, regular rhythm and normal heart sounds.   No murmur heard. Pulmonary/Chest: Effort normal and breath sounds normal. No respiratory distress. She has no wheezes.  Musculoskeletal: She exhibits no edema.  Neurological: She is alert and oriented to person, place, and time.  Psychiatric: She has a normal mood and affect. Her behavior is normal. Judgment and thought content normal.          Assessment & Plan:  Hypertension-  BP is a bit elevated today, however she notes that she had very stressful drive on the way over and is almost in a car accident. Blood pressure was much better on her recent visit. We'll plan to continue current medications, repeat her blood pressure 3 months. In the meantime I've asked her to keep an eye on her blood pressure at home and let me know if she has high readings at home.  Vitamin D deficiency-plan to check vitamin D level next visit.  Hypertriglyceridemia-will plan to have her come in fasting next visit so we can check her lipid panel.   Hyperglycemia-plan A1c next visit.

## 2016-10-10 ENCOUNTER — Other Ambulatory Visit: Payer: Self-pay | Admitting: Family

## 2016-10-10 DIAGNOSIS — E2839 Other primary ovarian failure: Secondary | ICD-10-CM

## 2016-10-16 ENCOUNTER — Ambulatory Visit
Admission: RE | Admit: 2016-10-16 | Discharge: 2016-10-16 | Disposition: A | Discharge status: Home / Self Care | Payer: Medicare Other | Source: Ambulatory Visit | Attending: Family | Admitting: Family

## 2016-10-16 ENCOUNTER — Encounter: Payer: Self-pay | Admitting: Family

## 2016-10-16 DIAGNOSIS — E2839 Other primary ovarian failure: Secondary | ICD-10-CM

## 2016-12-24 ENCOUNTER — Ambulatory Visit: Payer: Medicare Other | Admitting: Family

## 2017-01-14 ENCOUNTER — Encounter: Payer: Self-pay | Admitting: Family

## 2017-01-14 ENCOUNTER — Ambulatory Visit: Payer: Medicare Other | Admitting: Family

## 2017-01-14 VITALS — BP 146/80 | HR 63 | Temp 98.4°F | Resp 16 | Ht 64.0 in | Wt 167.6 lb

## 2017-01-14 DIAGNOSIS — I10 Essential (primary) hypertension: Secondary | ICD-10-CM | POA: Diagnosis not present

## 2017-01-14 DIAGNOSIS — E785 Hyperlipidemia, unspecified: Secondary | ICD-10-CM

## 2017-01-14 DIAGNOSIS — E781 Pure hyperglyceridemia: Secondary | ICD-10-CM | POA: Diagnosis not present

## 2017-01-14 DIAGNOSIS — R739 Hyperglycemia, unspecified: Secondary | ICD-10-CM | POA: Diagnosis not present

## 2017-01-14 LAB — BASIC METABOLIC PANEL
BUN: 8 mg/dL (ref 6–23)
CHLORIDE: 98 meq/L (ref 96–112)
CO2: 31 meq/L (ref 19–32)
Calcium: 9.2 mg/dL (ref 8.4–10.5)
Creatinine, Ser: 0.58 mg/dL (ref 0.40–1.20)
GFR: 109.56 mL/min (ref >60.00)
GLUCOSE: 114 mg/dL — AB (ref 70–99)
Potassium: 3.7 mEq/L (ref 3.5–5.1)
Sodium: 136 mEq/L (ref 135–145)

## 2017-01-14 LAB — LIPID PANEL
Cholesterol: 228 mg/dL — ABNORMAL HIGH (ref 0–200)
HDL: 65.8 mg/dL (ref >39.00)
Total CHOL/HDL Ratio: 3
Triglycerides: 556 mg/dL — ABNORMAL HIGH (ref 0.0–149.0)

## 2017-01-14 LAB — HEPATIC FUNCTION PANEL
ALBUMIN: 3.9 g/dL (ref 3.5–5.2)
ALK PHOS: 12 U/L — AB (ref 39–117)
ALT: 7 U/L (ref 0–35)
AST: 20 U/L (ref 0–37)
BILIRUBIN DIRECT: 0.1 mg/dL (ref 0.0–0.3)
TOTAL PROTEIN: 6.4 g/dL (ref 6.0–8.3)
Total Bilirubin: 0.5 mg/dL (ref 0.2–1.2)

## 2017-01-14 LAB — HEMOGLOBIN A1C: HEMOGLOBIN A1C: 5.6 % (ref 4.6–6.5)

## 2017-01-14 LAB — LDL CHOLESTEROL, DIRECT: Direct LDL: 86 mg/dL

## 2017-01-14 MED ORDER — TELMISARTAN-HCTZ 80-25 MG PO TABS: 1.0000 | ORAL_TABLET | Freq: Every day | ORAL | 1 refills | Status: DC

## 2017-01-14 MED ORDER — METOPROLOL TARTRATE 50 MG PO TABS: 50.0000 mg | ORAL_TABLET | Freq: Two times a day (BID) | ORAL | 1 refills | Status: DC

## 2017-01-14 NOTE — Assessment & Plan Note (Signed)
>>  ASSESSMENT AND PLAN FOR ESSENTIAL HYPERTENSION, BENIGN WRITTEN ON 01/14/2017 10:20 AM BY O'SULLIVAN, Kollyn Lingafelter, NP  At goal for her age. Continue current meds.

## 2017-01-14 NOTE — Assessment & Plan Note (Signed)
At goal for her age. Continue current meds.

## 2017-01-14 NOTE — Patient Instructions (Signed)
Please complete lab work prior to leaving.   

## 2017-01-14 NOTE — Assessment & Plan Note (Signed)
Obtain follow up a1c. Discussed diet to lower sugar and triglycerides.

## 2017-01-14 NOTE — Assessment & Plan Note (Signed)
Obtain follow up lipid panel, continue fish oil.

## 2017-01-14 NOTE — Progress Notes (Signed)
Subjective:    Patient ID: [name], female    DOB: 01/27/1946, 71 y.o.   MRN: [id number]  HPI  [name] is a 71 yr old female who presents today for follow up of multiple medical problems.  1) HTN- patient is maintained on micardis hct and lopressor. Denis CP/SOB/swelling.  BP Readings from Last 3 Encounters:  01/14/17 (!) 146/80  09/25/16 (!) 158/90  09/23/16 138/72   2) hyperlipidemia- not on statin.  Reports that she has started walking and watches her diet.   Lab Results  Component Value Date   CHOL 219 (H) 09/22/2015   HDL 61.60 09/22/2015   LDLDIRECT 115.0 09/22/2015   TRIG 326.0 (H) 09/22/2015   CHOLHDL 4 09/22/2015   3) Hyperglycemia-  Lab Results  Component Value Date   HGBA1C 5.9 05/07/2016      Review of Systems See HPI  Past Medical History:  Diagnosis Date  . Cervical dysplasia   . Endometrial hyperplasia   . Fibroid    Atypical leiomyoma  . History of colon polyps   . Hyperglycemia 05/23/2014  . Hypertension      Social History   Socioeconomic History  . Marital status: Married    Spouse name: Not on file  . Number of children: Not on file  . Years of education: Not on file  . Highest education level: Not on file  Social Needs  . Financial resource strain: Not on file  . Food insecurity - worry: Not on file  . Food insecurity - inability: Not on file  . Transportation needs - medical: Not on file  . Transportation needs - non-medical: Not on file  Occupational History  . Not on file  Tobacco Use  . Smoking status: Former Smoker    Packs/day: 0.25    Years: 30.00    Pack years: 7.50    Types: Cigarettes    Last attempt to quit: 2013    Years since quitting: 5.8  . Smokeless tobacco: Never Used  Substance and Sexual Activity  . Alcohol use: Yes    Alcohol/week: 0.0 oz    Comment: 2-4 glasses of wine on weekends  . Drug use: No  . Sexual activity: Yes    Birth control/protection: Surgical  Other Topics Concern  .  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   Enjoys reading, yard work, cares for her mother during the day, her brother helps during then night, enjoys poker   1 daughter (in [place]) and 2 grand-daughters (In [place]- age 75 and 49)    Past Surgical History:  Procedure Laterality Date  . ABDOMINAL HYSTERECTOMY  2004   TAH_BSO  . BREAST BIOPSY Left 2000  . BREAST BIOPSY Bilateral 1999  . BREAST SURGERY  1998   Benign left breast lump  . COLPOSCOPY    . HEMORRHOID SURGERY  1980's  . LYMPH NODE BIOPSY Left 1975   ? "removed lymph nodes in left leg"--benign per pt  . OOPHORECTOMY     BSO  . TONSILLECTOMY  1990    Family History  Problem Relation Age of Onset  . Hypertension Mother   . Heart disease Mother   . Diabetes Mother   . Breast cancer Mother 68  . Heart disease Father   . Cancer Father  prostate  . Cancer Sister 60       Leukemia, deceased  . Breast cancer Maternal Aunt        over 55  . Breast cancer Maternal Grandmother        over 32  . Hypertension Brother   . Diabetes Brother   . Stroke Brother     Allergies  Allergen Reactions  . Atorvastatin     Muscle cramps  . Fenofibrate     "Locked her muscles up"  . Hydralazine     "kidney pain"  . Potassium-Containing Compounds Other (See Comments)    Burning  sensation in kidney  area    Current Outpatient Medications on File Prior to Visit  Medication Sig Dispense Refill  . acetaminophen (TYLENOL) 500 MG tablet Take 1,000 mg by mouth daily as needed for moderate pain or headache.    . Calcium Carbonate-Vitamin D (CALCIUM + D PO) Take 2 tablets by mouth daily.     Marland Kitchen estradiol (ESTRACE) 0.5 MG tablet TAKE 1 TABLET(0.5 MG) BY MOUTH DAILY 90 tablet 3  . glucosamine-chondroitin 500-400 MG tablet Take 2 tablets by mouth daily.     . IRON PO Take by mouth.     . metoprolol tartrate (LOPRESSOR) 50 MG tablet Take 1 tablet (50 mg total) by mouth 2 (two) times daily. 180 tablet 1  . Multiple Vitamin (MULTIVITAMIN) tablet Take 1 tablet by mouth daily.      . Multiple Vitamins-Minerals (HAIR SKIN AND NAILS FORMULA) TABS Take 2 tablets by mouth daily.    . Omega-3 Fatty Acids ([employer]S FISH OIL) 1000 MG CAPS Take 2,000 capsules by mouth 2 (two) times daily. (Patient taking differently: Take 2,000 capsules by mouth daily. ) 90 capsule   . telmisartan-hydrochlorothiazide (MICARDIS HCT) 80-25 MG tablet Take 1 tablet by mouth daily. 90 tablet 1  . vitamin C (ASCORBIC ACID) 500 MG tablet Take 500 mg by mouth daily.     No current facility-administered medications on file prior to visit.     BP (!) 146/80 (BP Location: Left Arm, Cuff Size: Normal)   Pulse 63   Temp 98.4 F (36.9 C) (Oral)   Resp 16   Ht 5\' 4"  (1.626 m)   Wt 167 lb 9.6 oz (76 kg)   LMP 03/04/2002   SpO2 100%   BMI 28.77 kg/m       Objective:   Physical Exam  Constitutional: She is oriented to person, place, and time. She appears well-developed and well-nourished.  HENT:  Head: Normocephalic and atraumatic.  Cardiovascular: Normal rate, regular rhythm and normal heart sounds.  No murmur heard. Pulmonary/Chest: Effort normal and breath sounds normal. No respiratory distress. She has no wheezes.  Musculoskeletal: She exhibits no edema.  Neurological: She is alert and oriented to person, place, and time.  Psychiatric: She has a normal mood and affect. Her behavior is normal. Judgment and thought content normal.          Assessment & Plan:  Declines pneumovax/prevnar

## 2017-01-16 ENCOUNTER — Telehealth: Payer: Self-pay | Admitting: Family

## 2017-01-16 NOTE — Telephone Encounter (Signed)
Please advise pt:  triglycerides are very elevated. Please work on avoiding concentrated sweets, and limiting white carbs (rice/bread/pasta/potatoes). Instead substitute whole grain versions with reasonable portions.

## 2017-01-17 NOTE — Telephone Encounter (Signed)
Left message for pt to return my call.

## 2017-01-17 NOTE — Telephone Encounter (Signed)
Notified pt and she voices understanding. Requests that we mail her a copy of her labs. Labs mailed.

## 2017-03-28 ENCOUNTER — Other Ambulatory Visit: Payer: Self-pay | Admitting: Women's Health

## 2017-03-28 DIAGNOSIS — Z139 Encounter for screening, unspecified: Secondary | ICD-10-CM

## 2017-04-18 ENCOUNTER — Ambulatory Visit
Admission: RE | Admit: 2017-04-18 | Discharge: 2017-04-18 | Disposition: A | Discharge status: Home / Self Care | Payer: Medicare Other | Source: Ambulatory Visit | Attending: Women's Health | Admitting: Women's Health

## 2017-04-18 DIAGNOSIS — Z139 Encounter for screening, unspecified: Secondary | ICD-10-CM

## 2017-04-30 ENCOUNTER — Telehealth: Payer: Self-pay

## 2017-04-30 NOTE — Telephone Encounter (Signed)
I have found no documentation where patient was called.

## 2017-04-30 NOTE — Telephone Encounter (Signed)
No documentation where patient was called.

## 2017-05-05 ENCOUNTER — Encounter: Payer: Self-pay | Admitting: Women's Health

## 2017-05-05 ENCOUNTER — Ambulatory Visit: Payer: Medicare Other | Admitting: Women's Health

## 2017-05-05 VITALS — BP 118/78 | Ht 64.0 in | Wt 165.0 lb

## 2017-05-05 DIAGNOSIS — Z01419 Encounter for gynecological examination (general) (routine) without abnormal findings: Secondary | ICD-10-CM | POA: Diagnosis not present

## 2017-05-05 DIAGNOSIS — Z79890 Hormone replacement therapy (postmenopausal): Secondary | ICD-10-CM

## 2017-05-05 MED ORDER — ESTRADIOL 0.5 MG PO TABS: ORAL_TABLET | ORAL | 3 refills | Status: DC

## 2017-05-05 NOTE — Progress Notes (Signed)
[name] Mar 07, 1945 [id number]    History:    Presents for breast and pelvic exam. 2004 TAH with BSO for fibroids on estradiol without complaint. Reduced dose to 0.25 daily with good relief of symptoms. Unable to come off due to hot flushes, poor sleep and dryness. Normal Pap and mammogram history. History of a left breast benign biopsy. 2015 benign colon polyp has 5 year follow-up. 2018 normal DEXA. Has had Zostavax has not had Pneumovax. Hypertension and hypercholesterolemia primary care managing. Not sexually active, husbands health.  Past medical history, past surgical history, family history and social history were all reviewed and documented in the EPIC chart. One daughter.  ROS:  A ROS was performed and pertinent positives and negatives are included.  Exam:  Vitals:   05/05/17 1006  BP: 118/78  Weight: 165 lb (74.8 kg)  Height: 5\' 4"  (1.626 m)   Body mass index is 28.32 kg/m.   General appearance:  Normal Thyroid:  Symmetrical, normal in size, without palpable masses or nodularity. Respiratory  Auscultation:  Clear without wheezing or rhonchi Cardiovascular  Auscultation:  Regular rate, without rubs, murmurs or gallops  Edema/varicosities:  Not grossly evident Abdominal  Soft,nontender, without masses, guarding or rebound.  Liver/spleen:  No organomegaly noted  Hernia:  None appreciated  Skin  Inspection:  Grossly normal   Breasts: Examined lying and sitting.     Right: Without masses, retractions, discharge or axillary adenopathy.     Left: Without masses, retractions, discharge or axillary adenopathy. Gentitourinary   Inguinal/mons:  Normal without inguinal adenopathy  External genitalia:  Normal  BUS/Urethra/Skene's glands:  Normal  Vagina:  Normal  Cervix:  And uterus absent Adnexa/parametria:     Rt: Without masses or tenderness.   Lt: Without masses or tenderness.  Anus and perineum: Normal  Digital rectal exam: Normal sphincter tone without  palpated masses or tenderness  Assessment/Plan:  72 y.o. MWF G1 P1 for breast and pelvic exam with no complaints.  2004 TVH with BSO for fibroids on estradiol Hypertension/hypercholesterolemia-primary care managing labs and meds Normal DEXA  Plan: Estradiol 0.5 takes half tablet daily prescription, proper use, slight risk for blood clots,  strokes and breast cancer reviewed. Reviewed risks are less with a low dose. Pneumovax/Prevnar 13 reviewed and recommended. Will get a primary care. SBE's, continue annual screening mammogram, calcium rich diet, vitamin D 2000 daily encouraged. Home safety, fall prevention and importance of weightbearing/balance type exercise reviewed.    Parker, 11:00 AM 05/05/2017

## 2017-05-05 NOTE — Patient Instructions (Signed)
Health Maintenance for Postmenopausal Women Menopause is a normal process in which your reproductive ability comes to an end. This process happens gradually over a span of months to years, usually between the ages of 22 and 9. Menopause is complete when you have missed 12 consecutive menstrual periods. It is important to talk with your health care provider about some of the most common conditions that affect postmenopausal women, such as heart disease, cancer, and bone loss (osteoporosis). Adopting a healthy lifestyle and getting preventive care can help to promote your health and wellness. Those actions can also lower your chances of developing some of these common conditions. What should I know about menopause? During menopause, you may experience a number of symptoms, such as:  Moderate-to-severe hot flashes.  Night sweats.  Decrease in sex drive.  Mood swings.  Headaches.  Tiredness.  Irritability.  Memory problems.  Insomnia.  Choosing to treat or not to treat menopausal changes is an individual decision that you make with your health care provider. What should I know about hormone replacement therapy and supplements? Hormone therapy products are effective for treating symptoms that are associated with menopause, such as hot flashes and night sweats. Hormone replacement carries certain risks, especially as you become older. If you are thinking about using estrogen or estrogen with progestin treatments, discuss the benefits and risks with your health care provider. What should I know about heart disease and stroke? Heart disease, heart attack, and stroke become more likely as you age. This may be due, in part, to the hormonal changes that your body experiences during menopause. These can affect how your body processes dietary fats, triglycerides, and cholesterol. Heart attack and stroke are both medical emergencies. There are many things that you can do to help prevent heart disease  and stroke:  Have your blood pressure checked at least every 1-2 years. High blood pressure causes heart disease and increases the risk of stroke.  If you are 53-22 years old, ask your health care provider if you should take aspirin to prevent a heart attack or a stroke.  Do not use any tobacco products, including cigarettes, chewing tobacco, or electronic cigarettes. If you need help quitting, ask your health care provider.  It is important to eat a healthy diet and maintain a healthy weight. ? Be sure to include plenty of vegetables, fruits, low-fat dairy products, and lean protein. ? Avoid eating foods that are high in solid fats, added sugars, or salt (sodium).  Get regular exercise. This is one of the most important things that you can do for your health. ? Try to exercise for at least 150 minutes each week. The type of exercise that you do should increase your heart rate and make you sweat. This is known as moderate-intensity exercise. ? Try to do strengthening exercises at least twice each week. Do these in addition to the moderate-intensity exercise.  Know your numbers.Ask your health care provider to check your cholesterol and your blood glucose. Continue to have your blood tested as directed by your health care provider.  What should I know about cancer screening? There are several types of cancer. Take the following steps to reduce your risk and to catch any cancer development as early as possible. Breast Cancer  Practice breast self-awareness. ? This means understanding how your breasts normally appear and feel. ? It also means doing regular breast self-exams. Let your health care provider know about any changes, no matter how small.  If you are 40  or older, have a clinician do a breast exam (clinical breast exam or CBE) every year. Depending on your age, family history, and medical history, it may be recommended that you also have a yearly breast X-ray (mammogram).  If you  have a family history of breast cancer, talk with your health care provider about genetic screening.  If you are at high risk for breast cancer, talk with your health care provider about having an MRI and a mammogram every year.  Breast cancer (BRCA) gene test is recommended for women who have family members with BRCA-related cancers. Results of the assessment will determine the need for genetic counseling and BRCA1 and for BRCA2 testing. BRCA-related cancers include these types: ? Breast. This occurs in males or females. ? Ovarian. ? Tubal. This may also be called fallopian tube cancer. ? Cancer of the abdominal or pelvic lining (peritoneal cancer). ? Prostate. ? Pancreatic.  Cervical, Uterine, and Ovarian Cancer Your health care provider may recommend that you be screened regularly for cancer of the pelvic organs. These include your ovaries, uterus, and vagina. This screening involves a pelvic exam, which includes checking for microscopic changes to the surface of your cervix (Pap test).  For women ages 21-65, health care providers may recommend a pelvic exam and a Pap test every three years. For women ages 79-65, they may recommend the Pap test and pelvic exam, combined with testing for human papilloma virus (HPV), every five years. Some types of HPV increase your risk of cervical cancer. Testing for HPV may also be done on women of any age who have unclear Pap test results.  Other health care providers may not recommend any screening for nonpregnant women who are considered low risk for pelvic cancer and have no symptoms. Ask your health care provider if a screening pelvic exam is right for you.  If you have had past treatment for cervical cancer or a condition that could lead to cancer, you need Pap tests and screening for cancer for at least 20 years after your treatment. If Pap tests have been discontinued for you, your risk factors (such as having a new sexual partner) need to be  reassessed to determine if you should start having screenings again. Some women have medical problems that increase the chance of getting cervical cancer. In these cases, your health care provider may recommend that you have screening and Pap tests more often.  If you have a family history of uterine cancer or ovarian cancer, talk with your health care provider about genetic screening.  If you have vaginal bleeding after reaching menopause, tell your health care provider.  There are currently no reliable tests available to screen for ovarian cancer.  Lung Cancer Lung cancer screening is recommended for adults 69-62 years old who are at high risk for lung cancer because of a history of smoking. A yearly low-dose CT scan of the lungs is recommended if you:  Currently smoke.  Have a history of at least 30 pack-years of smoking and you currently smoke or have quit within the past 15 years. A pack-year is smoking an average of one pack of cigarettes per day for one year.  Yearly screening should:  Continue until it has been 15 years since you quit.  Stop if you develop a health problem that would prevent you from having lung cancer treatment.  Colorectal Cancer  This type of cancer can be detected and can often be prevented.  Routine colorectal cancer screening usually begins at  age 42 and continues through age 45.  If you have risk factors for colon cancer, your health care provider may recommend that you be screened at an earlier age.  If you have a family history of colorectal cancer, talk with your health care provider about genetic screening.  Your health care provider may also recommend using home test kits to check for hidden blood in your stool.  A small camera at the end of a tube can be used to examine your colon directly (sigmoidoscopy or colonoscopy). This is done to check for the earliest forms of colorectal cancer.  Direct examination of the colon should be repeated every  5-10 years until age 71. However, if early forms of precancerous polyps or small growths are found or if you have a family history or genetic risk for colorectal cancer, you may need to be screened more often.  Skin Cancer  Check your skin from head to toe regularly.  Monitor any moles. Be sure to tell your health care provider: ? About any new moles or changes in moles, especially if there is a change in a mole's shape or color. ? If you have a mole that is larger than the size of a pencil eraser.  If any of your family members has a history of skin cancer, especially at a young age, talk with your health care provider about genetic screening.  Always use sunscreen. Apply sunscreen liberally and repeatedly throughout the day.  Whenever you are outside, protect yourself by wearing long sleeves, pants, a wide-brimmed hat, and sunglasses.  What should I know about osteoporosis? Osteoporosis is a condition in which bone destruction happens more quickly than new bone creation. After menopause, you may be at an increased risk for osteoporosis. To help prevent osteoporosis or the bone fractures that can happen because of osteoporosis, the following is recommended:  If you are 46-71 years old, get at least 1,000 mg of calcium and at least 600 mg of vitamin D per day.  If you are older than age 55 but younger than age 65, get at least 1,200 mg of calcium and at least 600 mg of vitamin D per day.  If you are older than age 54, get at least 1,200 mg of calcium and at least 800 mg of vitamin D per day.  Smoking and excessive alcohol intake increase the risk of osteoporosis. Eat foods that are rich in calcium and vitamin D, and do weight-bearing exercises several times each week as directed by your health care provider. What should I know about how menopause affects my mental health? Depression may occur at any age, but it is more common as you become older. Common symptoms of depression  include:  Low or sad mood.  Changes in sleep patterns.  Changes in appetite or eating patterns.  Feeling an overall lack of motivation or enjoyment of activities that you previously enjoyed.  Frequent crying spells.  Talk with your health care provider if you think that you are experiencing depression. What should I know about immunizations? It is important that you get and maintain your immunizations. These include:  Tetanus, diphtheria, and pertussis (Tdap) booster vaccine.  Influenza every year before the flu season begins.  Pneumonia vaccine.  Shingles vaccine.  Your health care provider may also recommend other immunizations. This information is not intended to replace advice given to you by your health care provider. Make sure you discuss any questions you have with your health care provider. Document Released: 04/12/2005  Document Revised: 09/08/2015 Document Reviewed: 11/22/2014 Elsevier Interactive Patient Education  2018 [employer]  

## 2017-05-06 ENCOUNTER — Telehealth: Payer: Self-pay | Admitting: *Deleted

## 2017-05-06 LAB — URINALYSIS, COMPLETE W/RFL CULTURE
BACTERIA UA: NONE SEEN /HPF
Bilirubin Urine: NEGATIVE
Glucose, UA: NEGATIVE
HGB URINE DIPSTICK: NEGATIVE
HYALINE CAST: NONE SEEN /LPF
Ketones, ur: NEGATIVE
Leukocyte Esterase: NEGATIVE
Nitrites, Initial: NEGATIVE
PROTEIN: NEGATIVE
RBC / HPF: NONE SEEN /HPF (ref 0–2)
Specific Gravity, Urine: 1.015 (ref 1.001–1.03)
WBC UA: NONE SEEN /HPF (ref 0–5)
pH: <=5 (ref 5.0–8.0)

## 2017-05-06 LAB — URINE CULTURE
MICRO NUMBER:: 90275093
Result:: NO GROWTH
SPECIMEN QUALITY:: ADEQUATE

## 2017-05-06 LAB — NO CULTURE INDICATED

## 2017-05-06 NOTE — Telephone Encounter (Signed)
Prior authorization for estradiol 0.5 mg tablet done via [employer]mymed, will wait for response.

## 2017-05-06 NOTE — Telephone Encounter (Signed)
Medication approved viam [employer] through 03/03/18

## 2017-07-14 ENCOUNTER — Ambulatory Visit: Payer: Medicare Other | Admitting: Family

## 2017-07-29 ENCOUNTER — Ambulatory Visit: Payer: Medicare Other | Admitting: Family

## 2017-08-04 ENCOUNTER — Ambulatory Visit: Payer: Medicare Other | Admitting: Family

## 2017-08-04 ENCOUNTER — Encounter: Payer: Self-pay | Admitting: Family

## 2017-08-04 VITALS — BP 134/84 | HR 67 | Temp 98.1°F | Resp 18 | Ht 64.0 in | Wt 163.6 lb

## 2017-08-04 DIAGNOSIS — I10 Essential (primary) hypertension: Secondary | ICD-10-CM

## 2017-08-04 DIAGNOSIS — E785 Hyperlipidemia, unspecified: Secondary | ICD-10-CM

## 2017-08-04 NOTE — Patient Instructions (Addendum)
Please schedule a lab visit at the front desk. Schedule wellness visit with RN and 6 month follow up with Debbrah Alar NP.

## 2017-08-04 NOTE — Progress Notes (Signed)
Subjective:    Patient ID: [name], female    DOB: November 04, 1945, 72 y.o.   MRN: [id number]  HPI  Pt is a 72 yr old female who presents today for follow up.  HTN- on metoprolol and micardis hct.  Denies CP/SOB/swelling.   BP Readings from Last 3 Encounters:  08/04/17 134/84  05/05/17 118/78  01/14/17 (!) 146/80   Hyperlipidemia-  Lab Results  Component Value Date   CHOL 228 (H) 01/14/2017   HDL 65.80 01/14/2017   LDLDIRECT 86.0 01/14/2017   TRIG (H) 01/14/2017    556.0 Triglyceride is over 400; calculations on Lipids are invalid.   CHOLHDL 3 01/14/2017     Review of Systems See  HPI  Past Medical History:  Diagnosis Date  . Cervical dysplasia   . Endometrial hyperplasia   . Fibroid    Atypical leiomyoma  . History of colon polyps   . Hyperglycemia 05/23/2014  . Hypertension      Social History   Socioeconomic History  . Marital status: Married    Spouse name: Not on file  . Number of children: Not on file  . Years of education: Not on file  . Highest education level: Not on file  Occupational History  . Not on file  Social Needs  . Financial resource strain: Not on file  . Food insecurity:    Worry: Not on file    Inability: Not on file  . Transportation needs:    Medical: Not on file    Non-medical: Not on file  Tobacco Use  . Smoking status: Former Smoker    Packs/day: 0.25    Years: 30.00    Pack years: 7.50    Types: Cigarettes    Last attempt to quit: 2013    Years since quitting: 6.4  . Smokeless tobacco: Never Used  Substance and Sexual Activity  . Alcohol use: Yes    Alcohol/week: 0.0 oz    Comment: 2-4 glasses of wine on weekends  . Drug use: No  . Sexual activity: Yes    Birth control/protection: Surgical  Lifestyle  . Physical activity:    Days per week: Not on file    Minutes per session: Not on file  . Stress: Not on file  Relationships  . Social connections:    Talks on phone: Not on file    Gets together: Not on  file    Attends religious service: Not on file    Active member of club or organization: Not on file    Attends meetings of clubs or organizations: Not on file    Relationship status: Not on file  . Intimate partner violence:    Fear of current or ex partner: Not on file    Emotionally abused: Not on file    Physically abused: Not on file    Forced sexual activity: Not on file  Other Topics Concern  . 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   Enjoys reading, yard work, cares for her mother during the day, her brother helps during then night, enjoys poker   1 daughter (in [place]) and 2 grand-daughters (In [place]- age 96 and 35)    Past Surgical History:  Procedure Laterality Date  . ABDOMINAL HYSTERECTOMY  2004   TAH_BSO  .  BREAST BIOPSY Left 2000  . BREAST BIOPSY Bilateral 1999  . BREAST EXCISIONAL BIOPSY Left    benign  . BREAST SURGERY  1998   Benign left breast lump  . COLPOSCOPY    . EXCISION MASS LOWER EXTREMETIES Right 05/30/2016   Procedure: EXCISION RIGHT THIGH MASS;  Surgeon: Erroll Luna, MD;  Location: [facility];  Service: General;  Laterality: Right;  EXCISION RIGHT THIGH MASS  . HEMORRHOID SURGERY  1980's  . LYMPH NODE BIOPSY Left 1975   ? "removed lymph nodes in left leg"--benign per pt  . OOPHORECTOMY     BSO  . TONSILLECTOMY  1990    Family History  Problem Relation Age of Onset  . Hypertension Mother   . Heart disease Mother   . Diabetes Mother   . Breast cancer Mother 90  . Heart disease Father   . Cancer Father        prostate  . Cancer Sister 66       Leukemia, deceased  . Breast cancer Maternal Aunt        over 73  . Breast cancer Maternal Grandmother        over 76  . Hypertension Brother   . Diabetes Brother   . Stroke Brother     Allergies  Allergen Reactions  . Atorvastatin      Muscle cramps  . Fenofibrate     "Locked her muscles up"  . Hydralazine     "kidney pain"  . Potassium-Containing Compounds Other (See Comments)    Burning  sensation in kidney  area    Current Outpatient Medications on File Prior to Visit  Medication Sig Dispense Refill  . Calcium Carbonate-Vitamin D (CALCIUM + D PO) Take 2 tablets by mouth daily.     Marland Kitchen estradiol (ESTRACE) 0.5 MG tablet TAKE 1 TABLET(0.5 MG) BY MOUTH DAILY 90 tablet 3  . glucosamine-chondroitin 500-400 MG tablet Take 2 tablets by mouth daily.     . IRON PO Take by mouth.    Marland Kitchen KRILL OIL PO Take 1 tablet by mouth daily.    . metoprolol tartrate (LOPRESSOR) 50 MG tablet Take 1 tablet (50 mg total) 2 (two) times daily by mouth. 180 tablet 1  . Multiple Vitamin (MULTIVITAMIN) tablet Take 1 tablet by mouth daily.      . Multiple Vitamins-Minerals (HAIR SKIN AND NAILS FORMULA) TABS Take 2 tablets by mouth daily.    Marland Kitchen telmisartan-hydrochlorothiazide (MICARDIS HCT) 80-25 MG tablet Take 1 tablet daily by mouth. 90 tablet 1  . vitamin C (ASCORBIC ACID) 500 MG tablet Take 500 mg by mouth daily.     No current facility-administered medications on file prior to visit.     BP 134/84 (BP Location: Left Arm, Cuff Size: Normal)   Pulse 67   Temp 98.1 F (36.7 C) (Oral)   Resp 18   Ht 5\' 4"  (1.626 m)   Wt 163 lb 9.6 oz (74.2 kg)   LMP 03/04/2002   SpO2 100%   BMI 28.08 kg/m       Objective:   Physical Exam  Constitutional: She is oriented to person, place, and time. She appears well-developed and well-nourished.  Cardiovascular: Normal rate, regular rhythm and normal heart sounds.  No murmur heard. Pulmonary/Chest: Effort normal and breath sounds normal. No respiratory distress. She has no wheezes.  Neurological: She is alert and oriented to person, place, and time.  Skin: Skin is warm and dry.  Psychiatric: She has a normal mood  and affect. Her behavior is normal. Judgment and thought content normal.            Assessment & Plan:  HTN- BP stable, continue current meds. Obtain follow up bmet.  Hyperlipidemia- discussed diet, she will return fasting for lipid panel.

## 2017-08-07 ENCOUNTER — Encounter: Payer: Self-pay | Admitting: Family

## 2017-08-07 ENCOUNTER — Other Ambulatory Visit (INDEPENDENT_AMBULATORY_CARE_PROVIDER_SITE_OTHER): Payer: Medicare Other

## 2017-08-07 DIAGNOSIS — I10 Essential (primary) hypertension: Secondary | ICD-10-CM | POA: Diagnosis not present

## 2017-08-07 DIAGNOSIS — E785 Hyperlipidemia, unspecified: Secondary | ICD-10-CM

## 2017-08-07 LAB — BASIC METABOLIC PANEL
BUN: 15 mg/dL (ref 6–23)
CALCIUM: 9.6 mg/dL (ref 8.4–10.5)
CHLORIDE: 97 meq/L (ref 96–112)
CO2: 30 meq/L (ref 19–32)
CREATININE: 0.67 mg/dL (ref 0.40–1.20)
GFR: 92.61 mL/min (ref >60.00)
GLUCOSE: 106 mg/dL — AB (ref 70–99)
Potassium: 3.9 mEq/L (ref 3.5–5.1)
Sodium: 138 mEq/L (ref 135–145)

## 2017-08-07 LAB — LIPID PANEL
CHOLESTEROL: 204 mg/dL — AB (ref 0–200)
HDL: 76.4 mg/dL (ref >39.00)
NONHDL: 127.62
TRIGLYCERIDES: 229 mg/dL — AB (ref 0.0–149.0)
Total CHOL/HDL Ratio: 3
VLDL: 45.8 mg/dL — ABNORMAL HIGH (ref 0.0–40.0)

## 2017-08-07 LAB — LDL CHOLESTEROL, DIRECT: Direct LDL: 80 mg/dL

## 2017-09-12 ENCOUNTER — Other Ambulatory Visit: Payer: Self-pay | Admitting: Family

## 2017-09-17 NOTE — Progress Notes (Deleted)
Subjective:   [name] is a 72 y.o. female who presents for Medicare Annual (Subsequent) preventive examination.  Review of Systems: No ROS.  Medicare Wellness Visit. Additional risk factors are reflected in the social history.   Sleep patterns:   Home Safety/Smoke Alarms: Feels safe in home. Smoke alarms in place.  Living environment; residence and Firearm Safety:   Female:   Pap- hysterectomy      Mammo- utd Dexa scan- utd       CCS- 04/14/13-polyps removed. Done by Alen Blew @[facility]    Objective:     Vitals: LMP 03/04/2002   There is no height or weight on file to calculate BMI.  Advanced Directives 09/23/2016 05/31/2016 05/30/2016 05/24/2016 09/22/2015  Does Patient Have a Medical Advance Directive? Yes Yes - Yes Yes  Type of Advance Directive [place];Living will Living will - Living will [place];Living will  Does patient want to make changes to medical advance directive? - - No - Patient declined No - Patient declined -  Copy of [place] in Chart? No - copy requested - - - -    Tobacco Social History   Tobacco Use  Smoking Status Former Smoker  . Packs/day: 0.25  . Years: 30.00  . Pack years: 7.50  . Types: Cigarettes  . Last attempt to quit: 2013  . Years since quitting: 6.5  Smokeless Tobacco Never Used     Counseling given: Not Answered   Clinical Intake:                       Past Medical History:  Diagnosis Date  . Cervical dysplasia   . Endometrial hyperplasia   . Fibroid    Atypical leiomyoma  . History of colon polyps   . Hyperglycemia 05/23/2014  . Hypertension    Past Surgical History:  Procedure Laterality Date  . ABDOMINAL HYSTERECTOMY  2004   TAH_BSO  . BREAST BIOPSY Left 2000  . BREAST BIOPSY Bilateral 1999  . BREAST EXCISIONAL BIOPSY Left    benign  . BREAST SURGERY  1998   Benign left breast lump  . COLPOSCOPY    . EXCISION MASS  LOWER EXTREMETIES Right 05/30/2016   Procedure: EXCISION RIGHT THIGH MASS;  Surgeon: Erroll Luna, MD;  Location: [facility];  Service: General;  Laterality: Right;  EXCISION RIGHT THIGH MASS  . HEMORRHOID SURGERY  1980's  . LYMPH NODE BIOPSY Left 1975   ? "removed lymph nodes in left leg"--benign per pt  . OOPHORECTOMY     BSO  . TONSILLECTOMY  1990   Family History  Problem Relation Age of Onset  . Hypertension Mother   . Heart disease Mother   . Diabetes Mother   . Breast cancer Mother 82  . Heart disease Father   . Cancer Father        prostate  . Cancer Sister 21       Leukemia, deceased  . Breast cancer Maternal Aunt        over 78  . Breast cancer Maternal Grandmother        over 37  . Hypertension Brother   . Diabetes Brother   . Stroke Brother    Social History   Socioeconomic History  . Marital status: Married    Spouse name: Not on file  . Number of children: Not on file  . Years of education: Not on file  . Highest  education level: Not on file  Occupational History  . Not on file  Social Needs  . Financial resource strain: Not on file  . Food insecurity:    Worry: Not on file    Inability: Not on file  . Transportation needs:    Medical: Not on file    Non-medical: Not on file  Tobacco Use  . Smoking status: Former Smoker    Packs/day: 0.25    Years: 30.00    Pack years: 7.50    Types: Cigarettes    Last attempt to quit: 2013    Years since quitting: 6.5  . Smokeless tobacco: Never Used  Substance and Sexual Activity  . Alcohol use: Yes    Alcohol/week: 0.0 oz    Comment: 2-4 glasses of wine on weekends  . Drug use: No  . Sexual activity: Yes    Birth control/protection: Surgical  Lifestyle  . Physical activity:    Days per week: Not on file    Minutes per session: Not on file  . Stress: Not on file  Relationships  . Social connections:    Talks on phone: Not on file    Gets together: Not on file    Attends religious service: Not on file      Active member of club or organization: Not on file    Attends meetings of clubs or organizations: Not on file    Relationship status: Not on file  Other Topics Concern  . 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   Enjoys reading, yard work, cares for her mother during the day, her brother helps during then night, enjoys poker   1 daughter (in [place]) and 2 grand-daughters (In [place]- age 53 and 67)    Outpatient Encounter Medications as of 09/25/2017  Medication Sig  . Calcium Carbonate-Vitamin D (CALCIUM + D PO) Take 2 tablets by mouth daily.   Marland Kitchen estradiol (ESTRACE) 0.5 MG tablet TAKE 1 TABLET(0.5 MG) BY MOUTH DAILY  . glucosamine-chondroitin 500-400 MG tablet Take 2 tablets by mouth daily.   . IRON PO Take by mouth.  Marland Kitchen KRILL OIL PO Take 1 tablet by mouth daily.  . metoprolol tartrate (LOPRESSOR) 50 MG tablet TAKE 1 TABLET BY MOUTH 2 TIMES DAILY.  . Multiple Vitamin (MULTIVITAMIN) tablet Take 1 tablet by mouth daily.    . Multiple Vitamins-Minerals (HAIR SKIN AND NAILS FORMULA) TABS Take 2 tablets by mouth daily.  Marland Kitchen telmisartan-hydrochlorothiazide (MICARDIS HCT) 80-25 MG tablet TAKE 1 TABLET DAILY BY MOUTH.  . vitamin C (ASCORBIC ACID) 500 MG tablet Take 500 mg by mouth daily.   No facility-administered encounter medications on file as of 09/25/2017.     Activities of Daily Living In your present state of health, do you have any difficulty performing the following activities: 09/23/2016  Hearing? N  Vision? N  Difficulty concentrating or making decisions? N  Walking or climbing stairs? N  Dressing or bathing? N  Doing errands, shopping? N  Preparing Food and eating ? N  Using the Toilet? N  In the past six months, have you accidently leaked urine? N  Do you have problems with loss of bowel control? N  Managing your  Medications? N  Managing your Finances? N  Housekeeping or managing your Housekeeping? N  Some recent  data might be hidden    Patient Care Team: Debbrah Alar, NP as PCP - General (Internal Medicine) Juanita Craver, MD as Consulting Physician (Gastroenterology) Huel Cote, NP as Nurse Practitioner (Obstetrics and Gynecology) Charolotte Capuchin, MD as Consulting Physician (Dentistry)    Assessment:   This is a routine wellness examination for Shallyn. Physical assessment deferred to PCP.  Exercise Activities and Dietary recommendations   Diet (meal preparation, eat out, water intake, caffeinated beverages, dairy products, fruits and vegetables): {Desc; diets:16563} Breakfast: Lunch:  Dinner:      Goals    None      Fall Risk Fall Risk  09/23/2016 09/22/2015 09/19/2014 09/19/2014  Falls in the past year? No No No No   Depression Screen PHQ 2/9 Scores 09/23/2016 09/22/2015 09/19/2014 09/19/2014  PHQ - 2 Score 0 1 0 0     Cognitive Function MMSE - Mini Mental State Exam 09/22/2015  Orientation to time 5  Orientation to Place 5  Registration 3  Attention/ Calculation 5  Recall 3  Language- name 2 objects 2  Language- repeat 1  Language- follow 3 step command 3  Language- read & follow direction 1  Write a sentence 1  Copy design 1  Total score 30        Immunization History  Administered Date(s) Administered  . Td 09/19/2014  . Zoster 03/04/2012, 04/30/2014   Screening Tests Health Maintenance  Topic Date Due  . Hepatitis C Screening  08/05/2018 (Originally 17-Oct-1945)  . PNA vac Low Risk Adult (1 of 2 - PCV13) 08/05/2018 (Originally 01/17/2013)  . INFLUENZA VACCINE  05/23/2019 (Originally 10/02/2017)  . COLONOSCOPY  04/14/2018  . MAMMOGRAM  04/19/2019  . TETANUS/TDAP  09/18/2024  . DEXA SCAN  Completed      Plan:   ***   I have personally reviewed and noted the following in the patient's chart:   . Medical and social history . Use of alcohol, tobacco  or illicit drugs  . Current medications and supplements . Functional ability and status . Nutritional status . Physical activity . Advanced directives . List of other physicians . Hospitalizations, surgeries, and ER visits in previous 12 months . Vitals . Screenings to include cognitive, depression, and falls . Referrals and appointments  In addition, I have reviewed and discussed with patient certain preventive protocols, quality metrics, and best practice recommendations. A written personalized care plan for preventive services as well as general preventive health recommendations were provided to patient.     Naaman Plummer [place], [place]  09/17/2017

## 2017-09-24 ENCOUNTER — Ambulatory Visit: Payer: Medicare Other | Admitting: *Deleted

## 2017-09-25 ENCOUNTER — Ambulatory Visit: Payer: Medicare Other | Admitting: *Deleted

## 2017-10-10 NOTE — Progress Notes (Addendum)
Subjective:   [name] is a 72 y.o. female who presents for Medicare Annual (Subsequent) preventive examination.  Review of Systems: No ROS.  Medicare Wellness Visit. Additional risk factors are reflected in the social history. Cardiac Risk Factors include: hypertension Sleep patterns: No issues. Home Safety/Smoke Alarms: Feels safe in home. Smoke alarms in place.  Living environment; residence and Firearm Safety: Lives with husband in 2 story home.   Female:   Pap-  hysterectomy     Mammo- utd      Dexa scan- utd       CCS-last 04/14/13. Due 2020 per pt.    Objective:     Vitals: BP (!) 146/88 (BP Location: Left Arm, Cuff Size: Large)   Pulse 74   Ht 5\' 4"  (1.626 m)   Wt 164 lb 6.4 oz (74.6 kg)   LMP 03/04/2002   SpO2 96%   BMI 28.22 kg/m   Body mass index is 28.22 kg/m.  BP Readings from Last 3 Encounters:  10/16/17 (!) 146/88  08/04/17 134/84  05/05/17 118/78    Advanced Directives 10/16/2017 09/23/2016 05/31/2016 05/30/2016 05/24/2016 09/22/2015  Does Patient Have a Medical Advance Directive? Yes Yes Yes - Yes Yes  Type of Advance Directive [place];Living will [place];Living will Living will - Living will [place];Living will  Does patient want to make changes to medical advance directive? - - - No - Patient declined No - Patient declined -  Copy of [place] in Chart? No - copy requested No - copy requested - - - -    Tobacco Social History   Tobacco Use  Smoking Status Former Smoker  . Packs/day: 0.25  . Years: 30.00  . Pack years: 7.50  . Types: Cigarettes  . Last attempt to quit: 2013  . Years since quitting: 6.6  Smokeless Tobacco Never Used     Counseling given: Not Answered   Clinical Intake:  Pain : No/denies pain      Past Medical History:  Diagnosis Date  . Cervical dysplasia   . Endometrial hyperplasia   . Fibroid    Atypical leiomyoma  . History  of colon polyps   . Hyperglycemia 05/23/2014  . Hypertension    Past Surgical History:  Procedure Laterality Date  . ABDOMINAL HYSTERECTOMY  2004   TAH_BSO  . BREAST BIOPSY Left 2000  . BREAST BIOPSY Bilateral 1999  . BREAST EXCISIONAL BIOPSY Left    benign  . BREAST SURGERY  1998   Benign left breast lump  . COLPOSCOPY    . EXCISION MASS LOWER EXTREMETIES Right 05/30/2016   Procedure: EXCISION RIGHT THIGH MASS;  Surgeon: Erroll Luna, MD;  Location: [facility];  Service: General;  Laterality: Right;  EXCISION RIGHT THIGH MASS  . HEMORRHOID SURGERY  1980's  . LYMPH NODE BIOPSY Left 1975   ? "removed lymph nodes in left leg"--benign per pt  . OOPHORECTOMY     BSO  . TONSILLECTOMY  1990   Family History  Problem Relation Age of Onset  . Hypertension Mother   . Heart disease Mother   . Diabetes Mother   . Breast cancer Mother 58  . Heart disease Father   . Cancer Father        prostate  . Cancer Sister 51       Leukemia, deceased  . Breast cancer Maternal Aunt        over 58  . Breast  cancer Maternal Grandmother        over 22  . Hypertension Brother   . Diabetes Brother   . Stroke Brother    Social History   Socioeconomic History  . Marital status: Married    Spouse name: Not on file  . Number of children: Not on file  . Years of education: Not on file  . Highest education level: Not on file  Occupational History  . Not on file  Social Needs  . Financial resource strain: Not on file  . Food insecurity:    Worry: Not on file    Inability: Not on file  . Transportation needs:    Medical: Not on file    Non-medical: Not on file  Tobacco Use  . Smoking status: Former Smoker    Packs/day: 0.25    Years: 30.00    Pack years: 7.50    Types: Cigarettes    Last attempt to quit: 2013    Years since quitting: 6.6  . Smokeless tobacco: Never Used  Substance and Sexual Activity  . Alcohol use: Yes    Alcohol/week: 0.0 standard drinks    Comment: 2-4 glasses of  wine on weekends  . Drug use: No  . Sexual activity: Yes    Birth control/protection: Surgical  Lifestyle  . Physical activity:    Days per week: Not on file    Minutes per session: Not on file  . Stress: Not on file  Relationships  . Social connections:    Talks on phone: Not on file    Gets together: Not on file    Attends religious service: Not on file    Active member of club or organization: Not on file    Attends meetings of clubs or organizations: Not on file    Relationship status: Not on file  Other Topics Concern  . 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   Enjoys reading, yard work, cares for her mother during the day, her brother helps during then night, enjoys poker   1 daughter (in [place]) and 2 grand-daughters (In [place]- age 34 and 42)    Outpatient Encounter Medications as of 10/16/2017  Medication Sig  . Calcium Carbonate-Vitamin D (CALCIUM + D PO) Take 2 tablets by mouth daily.   Marland Kitchen estradiol (ESTRACE) 0.5 MG tablet TAKE 1 TABLET(0.5 MG) BY MOUTH DAILY  . glucosamine-chondroitin 500-400 MG tablet Take 2 tablets by mouth daily.   Marland Kitchen KRILL OIL PO Take 1 tablet by mouth daily.  . metoprolol tartrate (LOPRESSOR) 50 MG tablet TAKE 1 TABLET BY MOUTH 2 TIMES DAILY.  . Multiple Vitamin (MULTIVITAMIN) tablet Take 1 tablet by mouth daily.    Marland Kitchen telmisartan-hydrochlorothiazide (MICARDIS HCT) 80-25 MG tablet TAKE 1 TABLET DAILY BY MOUTH.  . vitamin C (ASCORBIC ACID) 500 MG tablet Take 500 mg by mouth daily.  . [DISCONTINUED] IRON PO Take by mouth.  . [DISCONTINUED] Multiple Vitamins-Minerals (HAIR SKIN AND NAILS FORMULA) TABS Take 2 tablets by mouth daily.   No facility-administered encounter medications on file as of 10/16/2017.     Activities of Daily Living In your present state of health, do you have any difficulty  performing the following activities: 10/16/2017  Hearing? N  Vision? N  Difficulty concentrating or making decisions? N  Walking  or climbing stairs? N  Dressing or bathing? N  Doing errands, shopping? N  Preparing Food and eating ? N  Using the Toilet? N  In the past six months, have you accidently leaked urine? N  Do you have problems with loss of bowel control? N  Managing your Medications? N  Managing your Finances? N  Housekeeping or managing your Housekeeping? N  Some recent data might be hidden    Patient Care Team: Debbrah Alar, NP as PCP - General (Internal Medicine) Juanita Craver, MD as Consulting Physician (Gastroenterology) Huel Cote, NP as Nurse Practitioner (Obstetrics and Gynecology) Charolotte Capuchin, MD as Consulting Physician (Dentistry)    Assessment:   This is a routine wellness examination for [name]. Physical assessment deferred to PCP.  Exercise Activities and Dietary recommendations Current Exercise Habits: Home exercise routine, Type of exercise: walking, Time (Minutes): 60, Frequency (Times/Week): 3, Weekly Exercise (Minutes/Week): 180, Intensity: Mild Diet (meal preparation, eat out, water intake, caffeinated beverages, dairy products, fruits and vegetables): well balanced   Goals    . Patient Stated     Decrease stress through reading, crafts, social time with friends. Increase physical activity as time allows. Maintain current state of health/no declines in health over the next year.       Fall Risk Fall Risk  10/16/2017 09/23/2016 09/22/2015 09/19/2014 09/19/2014  Falls in the past year? No No No No No    Depression Screen PHQ 2/9 Scores 10/16/2017 09/23/2016 09/22/2015 09/19/2014  PHQ - 2 Score 0 0 1 0     Cognitive Function Ad8 score reviewed for issues:  Issues making decisions:no  Less interest in hobbies / activities:no  Repeats questions, stories (family complaining):no  Trouble using ordinary gadgets (microwave, computer,  phone):no  Forgets the month or year: no  Mismanaging finances: no  Remembering appts:no  Daily problems with thinking and/or memory:no Ad8 score is=0     MMSE - Mini Mental State Exam 09/22/2015  Orientation to time 5  Orientation to Place 5  Registration 3  Attention/ Calculation 5  Recall 3  Language- name 2 objects 2  Language- repeat 1  Language- follow 3 step command 3  Language- read & follow direction 1  Write a sentence 1  Copy design 1  Total score 30        Immunization History  Administered Date(s) Administered  . Td 09/19/2014  . Zoster 03/04/2012, 04/30/2014    Screening Tests Health Maintenance  Topic Date Due  . Hepatitis C Screening  08/05/2018 (Originally 30-Apr-1945)  . PNA vac Low Risk Adult (1 of 2 - PCV13) 08/05/2018 (Originally 01/17/2013)  . INFLUENZA VACCINE  05/23/2019 (Originally 10/02/2017)  . COLONOSCOPY  04/14/2018  . MAMMOGRAM  04/19/2019  . TETANUS/TDAP  09/18/2024  . DEXA SCAN  Completed      Plan:    Please schedule your next medicare wellness visit with me in 1 yr.  Continue to eat heart healthy diet (full of fruits, vegetables, whole grains, lean protein, water--limit salt, fat, and sugar intake) and increase physical activity as tolerated.  Continue doing brain stimulating activities (puzzles, reading, adult coloring books, staying active) to keep memory sharp.   Bring a copy of your living will and/or healthcare power of attorney to your next office visit.  Your blood pressure was elevated today. Please continue to take your medication as prescribed. Please check your blood pressure at home as directed. Call our office or seek immediate medical treatment if your blood pressure continues to  stay high or in case of emergency--per Mackie Pai PA (Doc of day)  I have personally reviewed and noted the following in the patient's chart:   . Medical and social history . Use of alcohol, tobacco or illicit drugs  . Current  medications and supplements . Functional ability and status . Nutritional status . Physical activity . Advanced directives . List of other physicians . Hospitalizations, surgeries, and ER visits in previous 12 months . Vitals . Screenings to include cognitive, depression, and falls . Referrals and appointments  In addition, I have reviewed and discussed with patient certain preventive protocols, quality metrics, and best practice recommendations. A written personalized care plan for preventive services as well as general preventive health recommendations were provided to patient.     Shela Nevin, [place]  10/16/2017   Reviewed and agree with  RN Assessment &Plan.   Mackie Pai, PA-C

## 2017-10-16 ENCOUNTER — Ambulatory Visit (INDEPENDENT_AMBULATORY_CARE_PROVIDER_SITE_OTHER): Payer: Medicare Other | Admitting: *Deleted

## 2017-10-16 ENCOUNTER — Encounter: Payer: Self-pay | Admitting: *Deleted

## 2017-10-16 VITALS — BP 146/88 | HR 74 | Ht 64.0 in | Wt 164.4 lb

## 2017-10-16 DIAGNOSIS — Z0000 Encounter for general adult medical examination without abnormal findings: Secondary | ICD-10-CM | POA: Diagnosis not present

## 2017-10-16 NOTE — Patient Instructions (Addendum)
Please schedule your next medicare wellness visit with me in 1 yr.  Continue to eat heart healthy diet (full of fruits, vegetables, whole grains, lean protein, water--limit salt, fat, and sugar intake) and increase physical activity as tolerated.  Continue doing brain stimulating activities (puzzles, reading, adult coloring books, staying active) to keep memory sharp.   Bring a copy of your living will and/or healthcare power of attorney to your next office visit.  Your blood pressure was elevated today. Please continue to take your medication as prescribed. Please check your blood pressure at home as directed. Call our office or seek immediate medical treatment if your blood pressure continues to stay high or in case of emergency.  [name] , Thank you for taking time to come for your Medicare Wellness Visit. I appreciate your ongoing commitment to your health goals. Please review the following plan we discussed and let me know if I can assist you in the future.   These are the goals we discussed: Goals    . Patient Stated     Decrease stress through reading, crafts, social time with friends. Increase physical activity as time allows. Maintain current state of health/no declines in health over the next year.       This is a list of the screening recommended for you and due dates:  Health Maintenance  Topic Date Due  .  Hepatitis C: One time screening is recommended by Center for Disease Control  (CDC) for  adults born from [birth year] through 1963.   08/05/2018*  . Pneumonia vaccines (1 of 2 - PCV13) 08/05/2018*  . Flu Shot  05/23/2019*  . Colon Cancer Screening  04/14/2018  . Mammogram  04/19/2019  . Tetanus Vaccine  09/18/2024  . DEXA scan (bone density measurement)  Completed  *Topic was postponed. The date shown is not the original due date.    Hypertension Hypertension is another name for high blood pressure. High blood pressure forces your heart to work harder to pump blood. This  can cause problems over time. There are two numbers in a blood pressure reading. There is a top number (systolic) over a bottom number (diastolic). It is best to have a blood pressure below 120/80. Healthy choices can help lower your blood pressure. You may need medicine to help lower your blood pressure if:  Your blood pressure cannot be lowered with healthy choices.  Your blood pressure is higher than 130/80.  Follow these instructions at home: Eating and drinking  If directed, follow the DASH eating plan. This diet includes: ? Filling half of your plate at each meal with fruits and vegetables. ? Filling one quarter of your plate at each meal with whole grains. Whole grains include whole wheat pasta, brown rice, and whole grain bread. ? Eating or drinking low-fat dairy products, such as skim milk or low-fat yogurt. ? Filling one quarter of your plate at each meal with low-fat (lean) proteins. Low-fat proteins include fish, skinless chicken, eggs, beans, and tofu. ? Avoiding fatty meat, cured and processed meat, or chicken with skin. ? Avoiding premade or processed food.  Eat less than 1,500 mg of salt (sodium) a day.  Limit alcohol use to no more than 1 drink a day for nonpregnant women and 2 drinks a day for men. One drink equals 12 oz of beer, 5 oz of wine, or 1 oz of hard liquor. Lifestyle  Work with your doctor to stay at a healthy weight or to lose weight. Ask your  doctor what the best weight is for you.  Get at least 30 minutes of exercise that causes your heart to beat faster (aerobic exercise) most days of the week. This may include walking, swimming, or biking.  Get at least 30 minutes of exercise that strengthens your muscles (resistance exercise) at least 3 days a week. This may include lifting weights or pilates.  Do not use any products that contain nicotine or tobacco. This includes cigarettes and e-cigarettes. If you need help quitting, ask your doctor.  Check your  blood pressure at home as told by your doctor.  Keep all follow-up visits as told by your doctor. This is important. Medicines  Take over-the-counter and prescription medicines only as told by your doctor. Follow directions carefully.  Do not skip doses of blood pressure medicine. The medicine does not work as well if you skip doses. Skipping doses also puts you at risk for problems.  Ask your doctor about side effects or reactions to medicines that you should watch for. Contact a doctor if:  You think you are having a reaction to the medicine you are taking.  You have headaches that keep coming back (recurring).  You feel dizzy.  You have swelling in your ankles.  You have trouble with your vision. Get help right away if:  You get a very bad headache.  You start to feel confused.  You feel weak or numb.  You feel faint.  You get very bad pain in your: ? Chest. ? Belly (abdomen).  You throw up (vomit) more than once.  You have trouble breathing. Summary  Hypertension is another name for high blood pressure.  Making healthy choices can help lower blood pressure. If your blood pressure cannot be controlled with healthy choices, you may need to take medicine. This information is not intended to replace advice given to you by your health care provider. Make sure you discuss any questions you have with your health care provider. Document Released: 08/07/2007 Document Revised: 01/17/2016 Document Reviewed: 01/17/2016 Elsevier Interactive Patient Education  2018 [place]. Blood Pressure Record Sheet Your blood pressure on this visit to the emergency department or clinic is elevated. This does not necessarily mean you have high blood pressure (hypertension), but it does mean that your blood pressure needs to be rechecked. Many times your blood pressure can increase due to illness, pain, anxiety, or other factors. We recommend that you get a series of blood pressure  readings done over a period of 5 days. It is best to get a reading in the morning and one in the evening. You should make sure to sit and relax for 1-5 minutes before the reading is taken. Write the readings down and make a follow-up appointment with your health care provider to discuss the results. If there is not a free clinic or a drug store with a blood-pressure-taking machine near you, you can purchase blood-pressure-taking equipment from a drug store. Having one in the home allows you the convenience of taking your blood pressure while you are home and relaxed. Blood Pressure Log Date: _______________________  a.m. _____________________  p.m. _____________________  Date: _______________________  a.m. _____________________  p.m. _____________________  Date: _______________________  a.m. _____________________  p.m. _____________________  Date: _______________________  a.m. _____________________  p.m. _____________________  Date: _______________________  a.m. _____________________  p.m. _____________________  This information is not intended to replace advice given to you by your health care provider. Make sure you discuss any questions you have with your health  care provider. Document Released: 11/17/2002 Document Revised: 02/02/2016 Document Reviewed: 04/13/2013 Elsevier Interactive Patient Education  2018 [place] Eating Plan DASH stands for "Dietary Approaches to Stop Hypertension." The DASH eating plan is a healthy eating plan that has been shown to reduce high blood pressure (hypertension). It may also reduce your risk for type 2 diabetes, heart disease, and stroke. The DASH eating plan may also help with weight loss. What are tips for following this plan? General guidelines  Avoid eating more than 2,300 mg (milligrams) of salt (sodium) a day. If you have hypertension, you may need to reduce your sodium intake to 1,500 mg a day.  Limit alcohol intake to  no more than 1 drink a day for nonpregnant women and 2 drinks a day for men. One drink equals 12 oz of beer, 5 oz of wine, or 1 oz of hard liquor.  Work with your health care provider to maintain a healthy body weight or to lose weight. Ask what an ideal weight is for you.  Get at least 30 minutes of exercise that causes your heart to beat faster (aerobic exercise) most days of the week. Activities may include walking, swimming, or biking.  Work with your health care provider or diet and nutrition specialist (dietitian) to adjust your eating plan to your individual calorie needs. Reading food labels  Check food labels for the amount of sodium per serving. Choose foods with less than 5 percent of the Daily Value of sodium. Generally, foods with less than 300 mg of sodium per serving fit into this eating plan.  To find whole grains, look for the word "whole" as the first word in the ingredient list. Shopping  Buy products labeled as "low-sodium" or "no salt added."  Buy fresh foods. Avoid canned foods and premade or frozen meals. Cooking  Avoid adding salt when cooking. Use salt-free seasonings or herbs instead of table salt or sea salt. Check with your health care provider or pharmacist before using salt substitutes.  Do not fry foods. Cook foods using healthy methods such as baking, boiling, grilling, and broiling instead.  Cook with heart-healthy oils, such as olive, canola, soybean, or sunflower oil. Meal planning   Eat a balanced diet that includes: ? 5 or more servings of fruits and vegetables each day. At each meal, try to fill half of your plate with fruits and vegetables. ? Up to 6-8 servings of whole grains each day. ? Less than 6 oz of lean meat, poultry, or fish each day. A 3-oz serving of meat is about the same size as a deck of cards. One egg equals 1 oz. ? 2 servings of low-fat dairy each day. ? A serving of nuts, seeds, or beans 5 times each week. ? Heart-healthy fats.  Healthy fats called Omega-3 fatty acids are found in foods such as flaxseeds and coldwater fish, like sardines, salmon, and mackerel.  Limit how much you eat of the following: ? Canned or prepackaged foods. ? Food that is high in trans fat, such as fried foods. ? Food that is high in saturated fat, such as fatty meat. ? Sweets, desserts, sugary drinks, and other foods with added sugar. ? Full-fat dairy products.  Do not salt foods before eating.  Try to eat at least 2 vegetarian meals each week.  Eat more home-cooked food and less restaurant, buffet, and fast food.  When eating at a restaurant, ask that your food be prepared with less salt or no salt,  if possible. What foods are recommended? The items listed may not be a complete list. Talk with your dietitian about what dietary choices are best for you. Grains Whole-grain or whole-wheat bread. Whole-grain or whole-wheat pasta. Brown rice. [place]. Bulgur. Whole-grain and low-sodium cereals. Pita bread. Low-fat, low-sodium crackers. Whole-wheat flour tortillas. Vegetables Fresh or frozen vegetables (raw, steamed, roasted, or grilled). Low-sodium or reduced-sodium tomato and vegetable juice. Low-sodium or reduced-sodium tomato sauce and tomato paste. Low-sodium or reduced-sodium canned vegetables. Fruits All fresh, dried, or frozen fruit. Canned fruit in natural juice (without added sugar). Meat and other protein foods Skinless chicken or [place]. Ground chicken or [place]. Pork with fat trimmed off. Fish and seafood. Egg whites. Dried beans, peas, or lentils. Unsalted nuts, nut butters, and seeds. Unsalted canned beans. Lean cuts of beef with fat trimmed off. Low-sodium, lean deli meat. Dairy Low-fat (1%) or fat-free (skim) milk. Fat-free, low-fat, or reduced-fat cheeses. Nonfat, low-sodium ricotta or cottage cheese. Low-fat or nonfat yogurt. Low-fat, low-sodium cheese. Fats and oils Soft margarine without trans fats. Vegetable  oil. Low-fat, reduced-fat, or light mayonnaise and salad dressings (reduced-sodium). Canola, safflower, olive, soybean, and sunflower oils. Avocado. Seasoning and other foods Herbs. Spices. Seasoning mixes without salt. Unsalted popcorn and pretzels. Fat-free sweets. What foods are not recommended? The items listed may not be a complete list. Talk with your dietitian about what dietary choices are best for you. Grains Baked goods made with fat, such as croissants, muffins, or some breads. Dry pasta or rice meal packs. Vegetables Creamed or fried vegetables. Vegetables in a cheese sauce. Regular canned vegetables (not low-sodium or reduced-sodium). Regular canned tomato sauce and paste (not low-sodium or reduced-sodium). Regular tomato and vegetable juice (not low-sodium or reduced-sodium). Angie Fava. Olives. Fruits Canned fruit in a light or heavy syrup. Fried fruit. Fruit in cream or butter sauce. Meat and other protein foods Fatty cuts of meat. Ribs. Fried meat. Berniece Salines. Sausage. Bologna and other processed lunch meats. Salami. Fatback. Hotdogs. Bratwurst. Salted nuts and seeds. Canned beans with added salt. Canned or smoked fish. Whole eggs or egg yolks. Chicken or [place] with skin. Dairy Whole or 2% milk, cream, and half-and-half. Whole or full-fat cream cheese. Whole-fat or sweetened yogurt. Full-fat cheese. Nondairy creamers. Whipped toppings. Processed cheese and cheese spreads. Fats and oils Butter. Stick margarine. Lard. Shortening. Ghee. Bacon fat. Tropical oils, such as coconut, palm kernel, or palm oil. Seasoning and other foods Salted popcorn and pretzels. Onion salt, garlic salt, seasoned salt, table salt, and sea salt. Worcestershire sauce. Tartar sauce. Barbecue sauce. Teriyaki sauce. Soy sauce, including reduced-sodium. Steak sauce. Canned and packaged gravies. Fish sauce. Oyster sauce. Cocktail sauce. Horseradish that you find on the shelf. Ketchup. Mustard. Meat flavorings and  tenderizers. Bouillon cubes. Hot sauce and Tabasco sauce. Premade or packaged marinades. Premade or packaged taco seasonings. Relishes. Regular salad dressings. Where to find more information:  National Heart, Lung, and [place]: https://wilson-eaton.com/  American Heart Association: www.heart.org Summary  The DASH eating plan is a healthy eating plan that has been shown to reduce high blood pressure (hypertension). It may also reduce your risk for type 2 diabetes, heart disease, and stroke.  With the DASH eating plan, you should limit salt (sodium) intake to 2,300 mg a day. If you have hypertension, you may need to reduce your sodium intake to 1,500 mg a day.  When on the DASH eating plan, aim to eat more fresh fruits and vegetables, whole grains, lean proteins, low-fat dairy, and heart-healthy fats.  Work with your health care provider or diet and nutrition specialist (dietitian) to adjust your eating plan to your individual calorie needs. This information is not intended to replace advice given to you by your health care provider. Make sure you discuss any questions you have with your health care provider. Document Released: 02/07/2011 Document Revised: 02/12/2016 Document Reviewed: 02/12/2016 Elsevier Interactive Patient Education  [employer].

## 2018-02-03 ENCOUNTER — Ambulatory Visit: Payer: Medicare Other | Admitting: Family

## 2018-02-03 ENCOUNTER — Encounter: Payer: Self-pay | Admitting: Family

## 2018-02-03 VITALS — BP 172/94 | HR 65 | Temp 98.4°F | Resp 16 | Ht 64.0 in | Wt 161.0 lb

## 2018-02-03 DIAGNOSIS — E785 Hyperlipidemia, unspecified: Secondary | ICD-10-CM | POA: Diagnosis not present

## 2018-02-03 DIAGNOSIS — I10 Essential (primary) hypertension: Secondary | ICD-10-CM

## 2018-02-03 LAB — BASIC METABOLIC PANEL
BUN: 14 mg/dL (ref 6–23)
CHLORIDE: 97 meq/L (ref 96–112)
CO2: 31 mEq/L (ref 19–32)
CREATININE: 0.63 mg/dL (ref 0.40–1.20)
Calcium: 9.6 mg/dL (ref 8.4–10.5)
GFR: 99.28 mL/min (ref >60.00)
Glucose, Bld: 88 mg/dL (ref 70–99)
POTASSIUM: 4.1 meq/L (ref 3.5–5.1)
SODIUM: 138 meq/L (ref 135–145)

## 2018-02-03 LAB — LIPID PANEL
CHOL/HDL RATIO: 3
CHOLESTEROL: 197 mg/dL (ref 0–200)
HDL: 71 mg/dL (ref >39.00)
NonHDL: 125.51
Triglycerides: 256 mg/dL — ABNORMAL HIGH (ref 0.0–149.0)
VLDL: 51.2 mg/dL — ABNORMAL HIGH (ref 0.0–40.0)

## 2018-02-03 LAB — LDL CHOLESTEROL, DIRECT: Direct LDL: 84 mg/dL

## 2018-02-03 MED ORDER — ZOSTER VAC RECOMB ADJUVANTED 50 MCG/0.5ML IM SUSR: INTRAMUSCULAR | 1 refills | Status: DC

## 2018-02-03 NOTE — Progress Notes (Signed)
Subjective:    Patient ID: [name], female    DOB: 28-Oct-1945, 72 y.o.   MRN: [id number]  HPI  Patient is a 72 year old female who presents today for follow-up of her hypertension.  Current medication includes metoprolol and Micardis HCT. BP Readings from Last 3 Encounters:  02/03/18 (!) 172/94  10/16/17 (!) 146/88  08/04/17 134/84   Hyperlipidemia- last visit her cholesterol had trended downward from 228-204.  Her triglycerides have also come down from 556 to  229. Taking Krill oil. Has been watching her diet somewhat.  Lab Results  Component Value Date   CHOL 204 (H) 08/07/2017   HDL 76.40 08/07/2017   LDLDIRECT 80.0 08/07/2017   TRIG 229.0 (H) 08/07/2017   CHOLHDL 3 08/07/2017      Review of Systems    see HPI  Past Medical History:  Diagnosis Date  . Cervical dysplasia   . Endometrial hyperplasia   . Fibroid    Atypical leiomyoma  . History of colon polyps   . Hyperglycemia 05/23/2014  . Hypertension      Social History   Socioeconomic History  . Marital status: Married    Spouse name: Not on file  . Number of children: Not on file  . Years of education: Not on file  . Highest education level: Not on file  Occupational History  . Not on file  Social Needs  . Financial resource strain: Not on file  . Food insecurity:    Worry: Not on file    Inability: Not on file  . Transportation needs:    Medical: Not on file    Non-medical: Not on file  Tobacco Use  . Smoking status: Former Smoker    Packs/day: 0.25    Years: 30.00    Pack years: 7.50    Types: Cigarettes    Last attempt to quit: 2013    Years since quitting: 6.9  . Smokeless tobacco: Never Used  Substance and Sexual Activity  . Alcohol use: Yes    Alcohol/week: 0.0 standard drinks    Comment: 2-4 glasses of wine on weekends  . Drug use: No  . Sexual activity: Yes    Birth control/protection: Surgical  Lifestyle  . Physical activity:    Days per week: Not on file    Minutes  per session: Not on file  . Stress: Not on file  Relationships  . Social connections:    Talks on phone: Not on file    Gets together: Not on file    Attends religious service: Not on file    Active member of club or organization: Not on file    Attends meetings of clubs or organizations: Not on file    Relationship status: Not on file  . Intimate partner violence:    Fear of current or ex partner: Not on file    Emotionally abused: Not on file    Physically abused: Not on file    Forced sexual activity: Not on file  Other Topics Concern  . 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   Enjoys reading, yard work, cares for her mother during the day, her brother helps during then night, enjoys poker   1 daughter (in [place]) and 2 grand-daughters (In [place]- age 60 and 38)  Past Surgical History:  Procedure Laterality Date  . ABDOMINAL HYSTERECTOMY  2004   TAH_BSO  . BREAST BIOPSY Left 2000  . BREAST BIOPSY Bilateral 1999  . BREAST EXCISIONAL BIOPSY Left    benign  . BREAST SURGERY  1998   Benign left breast lump  . COLPOSCOPY    . EXCISION MASS LOWER EXTREMETIES Right 05/30/2016   Procedure: EXCISION RIGHT THIGH MASS;  Surgeon: Erroll Luna, MD;  Location: [facility];  Service: General;  Laterality: Right;  EXCISION RIGHT THIGH MASS  . HEMORRHOID SURGERY  1980's  . LYMPH NODE BIOPSY Left 1975   ? "removed lymph nodes in left leg"--benign per pt  . OOPHORECTOMY     BSO  . TONSILLECTOMY  1990    Family History  Problem Relation Age of Onset  . Hypertension Mother   . Heart disease Mother   . Diabetes Mother   . Breast cancer Mother 34  . Heart disease Father   . Cancer Father        prostate  . Cancer Sister 42       Leukemia, deceased  . Breast cancer Maternal Aunt        over 106  . Breast cancer Maternal Grandmother          over 76  . Hypertension Brother   . Diabetes Brother   . Stroke Brother     Allergies  Allergen Reactions  . Atorvastatin     Muscle cramps  . Fenofibrate     "Locked her muscles up"  . Hydralazine     "kidney pain"  . Potassium-Containing Compounds Other (See Comments)    Burning  sensation in kidney  area    Current Outpatient Medications on File Prior to Visit  Medication Sig Dispense Refill  . Calcium Carbonate-Vitamin D (CALCIUM + D PO) Take 2 tablets by mouth daily.     Marland Kitchen estradiol (ESTRACE) 0.5 MG tablet TAKE 1 TABLET(0.5 MG) BY MOUTH DAILY 90 tablet 3  . glucosamine-chondroitin 500-400 MG tablet Take 2 tablets by mouth daily.     Marland Kitchen KRILL OIL PO Take 1 tablet by mouth daily.    . metoprolol tartrate (LOPRESSOR) 50 MG tablet TAKE 1 TABLET BY MOUTH 2 TIMES DAILY. 180 tablet 1  . Multiple Vitamin (MULTIVITAMIN) tablet Take 1 tablet by mouth daily.      Marland Kitchen telmisartan-hydrochlorothiazide (MICARDIS HCT) 80-25 MG tablet TAKE 1 TABLET DAILY BY MOUTH. 90 tablet 1  . vitamin C (ASCORBIC ACID) 500 MG tablet Take 500 mg by mouth daily.     No current facility-administered medications on file prior to visit.     BP (!) 172/94   Pulse 65   Temp 98.4 F (36.9 C) (Oral)   Resp 16   Ht 5\' 4"  (1.626 m)   Wt 161 lb (73 kg)   LMP 03/04/2002   SpO2 100%   BMI 27.64 kg/m    Objective:   Physical Exam  Constitutional: She is oriented to person, place, and time. She appears well-developed and well-nourished.  Neck: Neck supple. No thyromegaly present.  Cardiovascular: Normal rate, regular rhythm and normal heart sounds.  No murmur heard. Pulmonary/Chest: Effort normal and breath sounds normal. No respiratory distress. She has no wheezes.  Neurological: She is alert and oriented to person, place, and time.  Skin: Skin is warm and dry.  Psychiatric: She has a normal mood and affect. Her behavior is normal. Judgment and thought content normal.  Assessment &  Plan:  HTN- BP is elevated today in the office.  She reports her home readings are much better.  I have advised her to check her home blood pressure readings once daily for 1 week and call me with her readings in 1 week.  We will continue current meds/doses for now.  Further recommendations after review of home readings.  Hyperlipidemia- we will recheck lipids today as she is fasting.  Last check was much improved over previous.  Continue Krill oil and dietary modification.  She declines a flu shot today.  She is requesting a prescription for the Shingrix vaccine which I have provided to her today.

## 2018-02-03 NOTE — Patient Instructions (Signed)
Please check your blood pressure once daily at home for 1 week and then call us with your readings. Complete lab work prior to leaving.

## 2018-02-12 ENCOUNTER — Telehealth: Payer: Self-pay

## 2018-02-12 NOTE — Telephone Encounter (Signed)
Copied from [place] [id number]. Topic: General - Inquiry >> Feb 12, 2018  7:59 AM Scherrie Gerlach wrote: Reason for CRM: pt was to report her bp for a week per Melissa.  The readings are AM readings daily: 12/4     143/86 12/5     139/82 12/6     136/78 12/7     134/81 12/8     131/82 12/9     130/84 12/10   133/81 12/11   130/76

## 2018-02-12 NOTE — Telephone Encounter (Signed)
BP looks great. Continue current meds. Follow up on 3 months.

## 2018-02-12 NOTE — Telephone Encounter (Signed)
Patient advised to keep same regimen, she already has her appointment set up for next year.

## 2018-02-23 ENCOUNTER — Other Ambulatory Visit: Payer: Self-pay | Admitting: Family

## 2018-03-17 ENCOUNTER — Other Ambulatory Visit: Payer: Self-pay | Admitting: Women's Health

## 2018-03-17 DIAGNOSIS — Z1231 Encounter for screening mammogram for malignant neoplasm of breast: Secondary | ICD-10-CM

## 2018-04-21 ENCOUNTER — Ambulatory Visit
Admission: RE | Admit: 2018-04-21 | Discharge: 2018-04-21 | Disposition: A | Discharge status: Home / Self Care | Payer: Medicare Other | Source: Ambulatory Visit | Attending: Women's Health | Admitting: Women's Health

## 2018-04-21 DIAGNOSIS — Z1231 Encounter for screening mammogram for malignant neoplasm of breast: Secondary | ICD-10-CM

## 2018-05-01 ENCOUNTER — Other Ambulatory Visit: Payer: Self-pay | Admitting: Women's Health

## 2018-05-01 DIAGNOSIS — Z79890 Hormone replacement therapy (postmenopausal): Secondary | ICD-10-CM

## 2018-05-01 NOTE — Telephone Encounter (Signed)
Annual exam on 05/12/18

## 2018-05-12 ENCOUNTER — Encounter: Payer: Medicare Other | Admitting: Women's Health

## 2018-05-19 ENCOUNTER — Encounter: Payer: Self-pay | Admitting: Family

## 2018-05-19 ENCOUNTER — Other Ambulatory Visit: Payer: Self-pay

## 2018-05-19 ENCOUNTER — Ambulatory Visit: Payer: Medicare Other | Admitting: Family

## 2018-05-19 VITALS — BP 154/92 | HR 62 | Temp 98.4°F | Resp 16 | Ht 64.0 in | Wt 159.0 lb

## 2018-05-19 DIAGNOSIS — R233 Spontaneous ecchymoses: Secondary | ICD-10-CM | POA: Diagnosis not present

## 2018-05-19 DIAGNOSIS — E781 Pure hyperglyceridemia: Secondary | ICD-10-CM

## 2018-05-19 DIAGNOSIS — I10 Essential (primary) hypertension: Secondary | ICD-10-CM

## 2018-05-19 DIAGNOSIS — E559 Vitamin D deficiency, unspecified: Secondary | ICD-10-CM | POA: Diagnosis not present

## 2018-05-19 LAB — CBC WITH DIFFERENTIAL/PLATELET
Basophils Absolute: 0 10*3/uL (ref 0.0–0.1)
Basophils Relative: 0.5 % (ref 0.0–3.0)
Eosinophils Absolute: 0 10*3/uL (ref 0.0–0.7)
Eosinophils Relative: 0.7 % (ref 0.0–5.0)
HCT: 38.1 % (ref 36.0–46.0)
Hemoglobin: 13.1 g/dL (ref 12.0–15.0)
Lymphocytes Relative: 24.7 % (ref 12.0–46.0)
Lymphs Abs: 1.8 10*3/uL (ref 0.7–4.0)
MCHC: 34.5 g/dL (ref 30.0–36.0)
MCV: 101.4 fl — ABNORMAL HIGH (ref 78.0–100.0)
Monocytes Absolute: 0.7 10*3/uL (ref 0.1–1.0)
Monocytes Relative: 9.2 % (ref 3.0–12.0)
Neutro Abs: 4.8 10*3/uL (ref 1.4–7.7)
Neutrophils Relative %: 64.9 % (ref 43.0–77.0)
Platelets: 314 10*3/uL (ref 150.0–400.0)
RBC: 3.76 Mil/uL — ABNORMAL LOW (ref 3.87–5.11)
RDW: 13.5 % (ref 11.5–15.5)
WBC: 7.3 10*3/uL (ref 4.0–10.5)

## 2018-05-19 NOTE — Patient Instructions (Signed)
Please complete lab work prior to leaving. Continue to monitor your home blood pressure.

## 2018-05-19 NOTE — Progress Notes (Signed)
Subjective:    Patient ID: [name], female    DOB: [birth year], 73 y.o.   MRN: [id number]  HPI  Patient is a 73 yr old female who presents today for follow up.  1) Hyperlipidemia- not on statin.  Lab Results  Component Value Date   CHOL 197 02/03/2018   HDL 71.00 02/03/2018   LDLDIRECT 84.0 02/03/2018   TRIG 256.0 (H) 02/03/2018   CHOLHDL 3 02/03/2018   2) HTN- bp meds include micardis hct, metoprolol.  BP Readings from Last 3 Encounters:  05/19/18 (!) 163/99  02/03/18 (!) 172/94  10/16/17 (!) 146/88   3) vit d deficiency-  Lab Results  Component Value Date   HGBA1C 5.6 01/14/2017      Review of Systems See HPI  Past Medical History:  Diagnosis Date  . Cervical dysplasia   . Endometrial hyperplasia   . Fibroid    Atypical leiomyoma  . History of colon polyps   . Hyperglycemia 05/23/2014  . Hypertension      Social History   Socioeconomic History  . Marital status: Married    Spouse name: Not on file  . Number of children: Not on file  . Years of education: Not on file  . Highest education level: Not on file  Occupational History  . Not on file  Social Needs  . Financial resource strain: Not on file  . Food insecurity:    Worry: Not on file    Inability: Not on file  . Transportation needs:    Medical: Not on file    Non-medical: Not on file  Tobacco Use  . Smoking status: Former Smoker    Packs/day: 0.25    Years: 30.00    Pack years: 7.50    Types: Cigarettes    Last attempt to quit: 2013    Years since quitting: 7.2  . Smokeless tobacco: Never Used  Substance and Sexual Activity  . Alcohol use: Yes    Alcohol/week: 0.0 standard drinks    Comment: 2-4 glasses of wine on weekends  . Drug use: No  . Sexual activity: Yes    Birth control/protection: Surgical  Lifestyle  . Physical activity:    Days per week: Not on file    Minutes per session: Not on file  . Stress: Not on file  Relationships  . Social connections:   Talks on phone: Not on file    Gets together: Not on file    Attends religious service: Not on file    Active member of club or organization: Not on file    Attends meetings of clubs or organizations: Not on file    Relationship status: Not on file  . Intimate partner violence:    Fear of current or ex partner: Not on file    Emotionally abused: Not on file    Physically abused: Not on file    Forced sexual activity: Not on file  Other Topics Concern  . 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   Enjoys reading, yard work, cares for her mother during the day, her brother helps during then night, enjoys poker   1 daughter (in [place]) and 2 grand-daughters (In [place]- age 60 and 27)    Past Surgical History:  Procedure Laterality Date  .  ABDOMINAL HYSTERECTOMY  2004   TAH_BSO  . BREAST BIOPSY Left 2000  . BREAST BIOPSY Bilateral 1999  . BREAST EXCISIONAL BIOPSY Left    benign  . BREAST SURGERY  1998   Benign left breast lump  . COLPOSCOPY    . EXCISION MASS LOWER EXTREMETIES Right 05/30/2016   Procedure: EXCISION RIGHT THIGH MASS;  Surgeon: Erroll Luna, MD;  Location: [facility];  Service: General;  Laterality: Right;  EXCISION RIGHT THIGH MASS  . HEMORRHOID SURGERY  1980's  . LYMPH NODE BIOPSY Left 1975   ? "removed lymph nodes in left leg"--benign per pt  . OOPHORECTOMY     BSO  . TONSILLECTOMY  1990    Family History  Problem Relation Age of Onset  . Hypertension Mother   . Heart disease Mother   . Diabetes Mother   . Breast cancer Mother 67  . Heart disease Father   . Cancer Father        prostate  . Cancer Sister 79       Leukemia, deceased  . Breast cancer Maternal Aunt        over 66  . Breast cancer Maternal Grandmother        over 55  . Hypertension Brother   . Diabetes Brother   . Stroke Brother      Allergies  Allergen Reactions  . Atorvastatin     Muscle cramps  . Fenofibrate     "Locked her muscles up"  . Hydralazine     "kidney pain"  . Potassium-Containing Compounds Other (See Comments)    Burning  sensation in kidney  area    Current Outpatient Medications on File Prior to Visit  Medication Sig Dispense Refill  . Calcium Carbonate-Vitamin D (CALCIUM + D PO) Take 2 tablets by mouth daily.     Marland Kitchen estradiol (ESTRACE) 0.5 MG tablet TAKE ONE TABLET BY MOUTH DAILY 90 tablet 0  . glucosamine-chondroitin 500-400 MG tablet Take 2 tablets by mouth daily.     Marland Kitchen KRILL OIL PO Take 1 tablet by mouth daily.    . metoprolol tartrate (LOPRESSOR) 50 MG tablet TAKE 1 TABLET BY MOUTH 2 TIMES DAILY. 180 tablet 1  . Multiple Vitamin (MULTIVITAMIN) tablet Take 1 tablet by mouth daily.      Marland Kitchen telmisartan-hydrochlorothiazide (MICARDIS HCT) 80-25 MG tablet TAKE 1 TABLET DAILY BY MOUTH. 90 tablet 1  . vitamin C (ASCORBIC ACID) 500 MG tablet Take 500 mg by mouth daily.    Marland Kitchen Zoster Vaccine Adjuvanted [facility]) injection Inject 0.5mg  IM now and again in 2-6 months. 0.5 mL 1   No current facility-administered medications on file prior to visit.     BP (!) 163/99 (BP Location: Right Arm, Patient Position: Sitting, Cuff Size: Small)   Pulse 62   Temp 98.4 F (36.9 C) (Oral)   Resp 16   Ht 5\' 4"  (1.626 m)   Wt 159 lb (72.1 kg)   LMP 03/04/2002   SpO2 100%   BMI 27.29 kg/m       Objective:   Physical Exam Constitutional:      Appearance: She is well-developed.  Neck:     Musculoskeletal: Neck supple.     Thyroid: No thyromegaly.  Cardiovascular:     Rate and Rhythm: Normal rate and regular rhythm.     Heart sounds: Normal heart sounds. No murmur.  Pulmonary:     Effort: Pulmonary effort is normal. No respiratory distress.     Breath sounds:  Normal breath sounds. No wheezing.  Skin:    General: Skin is warm and dry.     Comments: Petechiae noted bilateral forearms  Neurological:      Mental Status: She is alert and oriented to person, place, and time.  Psychiatric:        Behavior: Behavior normal.        Thought Content: Thought content normal.        Judgment: Judgment normal.           Assessment & Plan:  Petechiae- pt states that this is not new.  Obtain CBC.  HTN-bp mildly elevated here. Pt reports much better bp at home. Advised pt to continue to monitor BP at home and let us know if it runs high at home.    Vit D deficiency- obtain follow up Vit D level.  Hyperlipidemia- trigs were better in December.  Monitor.

## 2018-05-20 NOTE — Progress Notes (Signed)
Letter mailed

## 2018-05-25 LAB — VITAMIN D 1,25 DIHYDROXY
Vitamin D 1, 25 (OH)2 Total: 46 pg/mL (ref 18–72)
Vitamin D2 1, 25 (OH)2: <8 pg/mL
Vitamin D3 1, 25 (OH)2: 46 pg/mL

## 2018-06-03 ENCOUNTER — Encounter: Payer: Medicare Other | Admitting: Women's Health

## 2018-07-14 ENCOUNTER — Encounter: Payer: Medicare Other | Admitting: Women's Health

## 2018-07-27 ENCOUNTER — Other Ambulatory Visit: Payer: Self-pay | Admitting: Women's Health

## 2018-07-27 DIAGNOSIS — Z79890 Hormone replacement therapy (postmenopausal): Secondary | ICD-10-CM

## 2018-07-28 NOTE — Telephone Encounter (Signed)
annual scheduled on 08/31/18

## 2018-08-12 ENCOUNTER — Other Ambulatory Visit: Payer: Self-pay | Admitting: Family

## 2018-08-28 ENCOUNTER — Other Ambulatory Visit: Payer: Self-pay

## 2018-08-31 ENCOUNTER — Other Ambulatory Visit: Payer: Self-pay

## 2018-08-31 ENCOUNTER — Ambulatory Visit (INDEPENDENT_AMBULATORY_CARE_PROVIDER_SITE_OTHER): Payer: Medicare Other | Admitting: Women's Health

## 2018-08-31 ENCOUNTER — Encounter: Payer: Self-pay | Admitting: Women's Health

## 2018-08-31 VITALS — BP 119/83 | Ht 63.0 in | Wt 161.0 lb

## 2018-08-31 DIAGNOSIS — Z79890 Hormone replacement therapy (postmenopausal): Secondary | ICD-10-CM

## 2018-08-31 DIAGNOSIS — Z01419 Encounter for gynecological examination (general) (routine) without abnormal findings: Secondary | ICD-10-CM | POA: Diagnosis not present

## 2018-08-31 MED ORDER — ESTRADIOL 0.5 MG PO TABS: ORAL_TABLET | ORAL | 4 refills | Status: DC

## 2018-08-31 NOTE — Progress Notes (Signed)
DOVE GRESHAM [birth year] [id number]    History:    Presents for breast and pelvic exam.  Normal Pap and mammogram history.  2004 TAH with BSO for fibroids on HRT.  2015 benign colon polyp schedule follow-up this year.  2018 DEXA at primary care +2.5 at spine.  Has had Shingrix and Pneumovax.  Primary care manages hypertension, hypercholesteremia.  Not sexually active, husband's health.  Past medical history, past surgical history, family history and social history were all reviewed and documented in the EPIC chart.  Has 1 daughter and 2 granddaughters youngest granddaughter with type 1 diabetes completing college at [employer] on a full scholarship.  ROS:  A ROS was performed and pertinent positives and negatives are included.  Exam:  Vitals:   08/31/18 1119  BP: 119/83  Weight: 161 lb (73 kg)  Height: 5\' 3"  (1.6 m)   Body mass index is 28.52 kg/m.   General appearance:  Normal Thyroid:  Symmetrical, normal in size, without palpable masses or nodularity. Respiratory  Auscultation:  Clear without wheezing or rhonchi Cardiovascular  Auscultation:  Regular rate, without rubs, murmurs or gallops  Edema/varicosities:  Not grossly evident Abdominal  Soft,nontender, without masses, guarding or rebound.  Liver/spleen:  No organomegaly noted  Hernia:  None appreciated  Skin  Inspection:  Grossly normal   Breasts: Examined lying and sitting.     Right: Without masses, retractions, discharge or axillary adenopathy.     Left: Without masses, retractions, discharge or axillary adenopathy. Gentitourinary   Inguinal/mons:  Normal without inguinal adenopathy  External genitalia:  Normal  BUS/Urethra/Skene's glands:  Normal  Vagina:  Normal  Cervix: and Uterus absent  Adnexa/parametria:     Rt: Without masses or tenderness.   Lt: Without masses or tenderness.  Anus and perineum: Normal  Digital rectal exam: Normal sphincter tone without palpated masses or  tenderness  Assessment/Plan:  73 y.o. MWF G1, P1 for breast and pelvic exam with no complaints.  2004 TAH with BSO for fibroids on 0.025 estradiol daily with occasional hot flushes Hypertension, hypercholesteremia-primary care manages labs and meds  Plan: HRT reviewed risks of blood clots, strokes and breast cancer, would like to continue on half tablet of the estradiol 0.5 daily.  Prescription given.  SBEs, continue annual screening mammogram, calcium rich foods, vitamin D 2000 daily encouraged.  Home safety, fall prevention and importance of weightbearing and balance type exercise encouraged.  Keep scheduled follow-up screening colonoscopy.   [place], 11:44 AM 08/31/2018

## 2018-08-31 NOTE — Patient Instructions (Signed)
Health Maintenance After Age 71 After age 71, you are at a higher risk for certain long-term diseases and infections as well as injuries from falls. Falls are a major cause of broken bones and head injuries in people who are older than age 71. Getting regular preventive care can help to keep you healthy and well. Preventive care includes getting regular testing and making lifestyle changes as recommended by your health care provider. Talk with your health care provider about:  Which screenings and tests you should have. A screening is a test that checks for a disease when you have no symptoms.  A diet and exercise plan that is right for you. What should I know about screenings and tests to prevent falls? Screening and testing are the best ways to find a health problem early. Early diagnosis and treatment give you the best chance of managing medical conditions that are common after age 71. Certain conditions and lifestyle choices may make you more likely to have a fall. Your health care provider may recommend:  Regular vision checks. Poor vision and conditions such as cataracts can make you more likely to have a fall. If you wear glasses, make sure to get your prescription updated if your vision changes.  Medicine review. Work with your health care provider to regularly review all of the medicines you are taking, including over-the-counter medicines. Ask your health care provider about any side effects that may make you more likely to have a fall. Tell your health care provider if any medicines that you take make you feel dizzy or sleepy.  Osteoporosis screening. Osteoporosis is a condition that causes the bones to get weaker. This can make the bones weak and cause them to break more easily.  Blood pressure screening. Blood pressure changes and medicines to control blood pressure can make you feel dizzy.  Strength and balance checks. Your health care provider may recommend certain tests to check your  strength and balance while standing, walking, or changing positions.  Foot health exam. Foot pain and numbness, as well as not wearing proper footwear, can make you more likely to have a fall.  Depression screening. You may be more likely to have a fall if you have a fear of falling, feel emotionally low, or feel unable to do activities that you used to do.  Alcohol use screening. Using too much alcohol can affect your balance and may make you more likely to have a fall. What actions can I take to lower my risk of falls? General instructions  Talk with your health care provider about your risks for falling. Tell your health care provider if: ? You fall. Be sure to tell your health care provider about all falls, even ones that seem minor. ? You feel dizzy, sleepy, or off-balance.  Take over-the-counter and prescription medicines only as told by your health care provider. These include any supplements.  Eat a healthy diet and maintain a healthy weight. A healthy diet includes low-fat dairy products, low-fat (lean) meats, and fiber from whole grains, beans, and lots of fruits and vegetables. Home safety  Remove any tripping hazards, such as rugs, cords, and clutter.  Install safety equipment such as grab bars in bathrooms and safety rails on stairs.  Keep rooms and walkways well-lit. Activity   Follow a regular exercise program to stay fit. This will help you maintain your balance. Ask your health care provider what types of exercise are appropriate for you.  If you need a cane or   walker, use it as recommended by your health care provider.  Wear supportive shoes that have nonskid soles. Lifestyle  Do not drink alcohol if your health care provider tells you not to drink.  If you drink alcohol, limit how much you have: ? 0-1 drink a day for women. ? 0-2 drinks a day for men.  Be aware of how much alcohol is in your drink. In the [place] one drink equals one typical bottle of beer (12  oz), one-half glass of wine (5 oz), or one shot of hard liquor (1 oz).  Do not use any products that contain nicotine or tobacco, such as cigarettes and e-cigarettes. If you need help quitting, ask your health care provider. Summary  Having a healthy lifestyle and getting preventive care can help to protect your health and wellness after age 71.  Screening and testing are the best way to find a health problem early and help you avoid having a fall. Early diagnosis and treatment give you the best chance for managing medical conditions that are more common for people who are older than age 71.  Falls are a major cause of broken bones and head injuries in people who are older than age 71. Take precautions to prevent a fall at home.  Work with your health care provider to learn what changes you can make to improve your health and wellness and to prevent falls. This information is not intended to replace advice given to you by your health care provider. Make sure you discuss any questions you have with your health care provider. Document Released: 01/01/2017 Document Revised: 06/11/2018 Document Reviewed: 01/01/2017 Elsevier Patient Education  2020 Elsevier Inc.  

## 2018-10-08 ENCOUNTER — Telehealth: Payer: Self-pay | Admitting: Family

## 2018-10-08 NOTE — Telephone Encounter (Signed)
Patient returned call regarding AWV. Patient stated that she is not interested at this time. SF

## 2018-10-24 ENCOUNTER — Other Ambulatory Visit: Payer: Self-pay | Admitting: Women's Health

## 2018-10-24 DIAGNOSIS — Z79890 Hormone replacement therapy (postmenopausal): Secondary | ICD-10-CM

## 2018-11-20 ENCOUNTER — Other Ambulatory Visit: Payer: Self-pay

## 2018-11-20 ENCOUNTER — Ambulatory Visit (INDEPENDENT_AMBULATORY_CARE_PROVIDER_SITE_OTHER): Payer: Medicare Other | Admitting: Family

## 2018-11-20 ENCOUNTER — Encounter: Payer: Self-pay | Admitting: Family

## 2018-11-20 VITALS — BP 168/95 | HR 63 | Temp 96.4°F | Resp 16 | Ht 63.0 in | Wt 158.0 lb

## 2018-11-20 DIAGNOSIS — I10 Essential (primary) hypertension: Secondary | ICD-10-CM

## 2018-11-20 DIAGNOSIS — Z23 Encounter for immunization: Secondary | ICD-10-CM | POA: Diagnosis not present

## 2018-11-20 DIAGNOSIS — E785 Hyperlipidemia, unspecified: Secondary | ICD-10-CM | POA: Diagnosis not present

## 2018-11-20 LAB — COMPREHENSIVE METABOLIC PANEL
ALT: 11 U/L (ref 0–35)
AST: 28 U/L (ref 0–37)
Albumin: 3.8 g/dL (ref 3.5–5.2)
Alkaline Phosphatase: 21 U/L — ABNORMAL LOW (ref 39–117)
BUN: 8 mg/dL (ref 6–23)
CO2: 32 mEq/L (ref 19–32)
Calcium: 9.7 mg/dL (ref 8.4–10.5)
Chloride: 98 mEq/L (ref 96–112)
Creatinine, Ser: 0.73 mg/dL (ref 0.40–1.20)
GFR: 78.63 mL/min (ref >60.00)
Glucose, Bld: 126 mg/dL — ABNORMAL HIGH (ref 70–99)
Potassium: 3.1 mEq/L — ABNORMAL LOW (ref 3.5–5.1)
Sodium: 139 mEq/L (ref 135–145)
Total Bilirubin: 0.7 mg/dL (ref 0.2–1.2)
Total Protein: 6.4 g/dL (ref 6.0–8.3)

## 2018-11-20 LAB — LDL CHOLESTEROL, DIRECT: Direct LDL: 86 mg/dL

## 2018-11-20 LAB — LIPID PANEL
Cholesterol: 192 mg/dL (ref 0–200)
HDL: 76.2 mg/dL (ref >39.00)
NonHDL: 115.57
Total CHOL/HDL Ratio: 3
Triglycerides: 243 mg/dL — ABNORMAL HIGH (ref 0.0–149.0)
VLDL: 48.6 mg/dL — ABNORMAL HIGH (ref 0.0–40.0)

## 2018-11-20 MED ORDER — METOPROLOL TARTRATE 50 MG PO TABS: 50.0000 mg | ORAL_TABLET | Freq: Two times a day (BID) | ORAL | 1 refills | Status: DC

## 2018-11-20 MED ORDER — TELMISARTAN-HCTZ 80-25 MG PO TABS: 1.0000 | ORAL_TABLET | Freq: Every day | ORAL | 1 refills | Status: DC

## 2018-11-20 NOTE — Progress Notes (Signed)
Subjective:    Patient ID: [name], female    DOB: 02-Jun-1945, 73 y.o.   MRN: [id number]  HPI  Patient is a 73 yr old female who presents today for follow up.  HTN- maintained on metoprolol 50 mg bid and micardis-HCT. Denies LE edema.  Hyperlipidemia- has been working on improving her diet.   Lab Results  Component Value Date   CHOL 197 02/03/2018   HDL 71.00 02/03/2018   LDLDIRECT 84.0 02/03/2018   TRIG 256.0 (H) 02/03/2018   CHOLHDL 3 02/03/2018      Review of Systems See HPI  Past Medical History:  Diagnosis Date  . Cervical dysplasia   . Endometrial hyperplasia   . Fibroid    Atypical leiomyoma  . History of colon polyps   . Hyperglycemia 05/23/2014  . Hypertension      Social History   Socioeconomic History  . Marital status: Married    Spouse name: Not on file  . Number of children: Not on file  . Years of education: Not on file  . Highest education level: Not on file  Occupational History  . Not on file  Social Needs  . Financial resource strain: Not on file  . Food insecurity    Worry: Not on file    Inability: Not on file  . Transportation needs    Medical: Not on file    Non-medical: Not on file  Tobacco Use  . Smoking status: Former Smoker    Packs/day: 0.25    Years: 30.00    Pack years: 7.50    Types: Cigarettes    Quit date: 2013    Years since quitting: 7.7  . Smokeless tobacco: Never Used  Substance and Sexual Activity  . Alcohol use: Yes    Alcohol/week: 0.0 standard drinks    Comment: 2-4 glasses of wine on weekends  . Drug use: No  . Sexual activity: Yes    Birth control/protection: Surgical  Lifestyle  . Physical activity    Days per week: Not on file    Minutes per session: Not on file  . Stress: Not on file  Relationships  . Social Herbalist on phone: Not on file    Gets together: Not on file    Attends religious service: Not on file    Active member of club or organization: Not on file   Attends meetings of clubs or organizations: Not on file    Relationship status: Not on file  . Intimate partner violence    Fear of current or ex partner: Not on file    Emotionally abused: Not on file    Physically abused: Not on file    Forced sexual activity: Not on file  Other Topics Concern  . 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   Enjoys reading, yard work, cares for her mother during the day, her brother helps during then night, enjoys poker   1 daughter (in [place]) and 2 grand-daughters (In [place]- age 34 and 74)    Past Surgical History:  Procedure Laterality Date  . ABDOMINAL HYSTERECTOMY  2004   TAH_BSO  . BREAST BIOPSY Left 2000  . BREAST BIOPSY Bilateral 1999  . BREAST EXCISIONAL BIOPSY Left    benign  . BREAST  SURGERY  1998   Benign left breast lump  . COLPOSCOPY    . EXCISION MASS LOWER EXTREMETIES Right 05/30/2016   Procedure: EXCISION RIGHT THIGH MASS;  Surgeon: Erroll Luna, MD;  Location: [place];  Service: General;  Laterality: Right;  EXCISION RIGHT THIGH MASS  . HEMORRHOID SURGERY  1980's  . LYMPH NODE BIOPSY Left 1975   ? "removed lymph nodes in left leg"--benign per pt  . OOPHORECTOMY     BSO  . TONSILLECTOMY  1990    Family History  Problem Relation Age of Onset  . Hypertension Mother   . Heart disease Mother   . Diabetes Mother   . Breast cancer Mother 76  . Heart disease Father   . Cancer Father        prostate  . Cancer Sister 22       Leukemia, deceased  . Breast cancer Maternal Aunt        over 90  . Breast cancer Maternal Grandmother        over 80  . Hypertension Brother   . Diabetes Brother   . Stroke Brother     Allergies  Allergen Reactions  . Atorvastatin     Muscle cramps  . Fenofibrate     "Locked her muscles up"  . Hydralazine     "kidney pain"  .  Potassium-Containing Compounds Other (See Comments)    Burning  sensation in kidney  area    Current Outpatient Medications on File Prior to Visit  Medication Sig Dispense Refill  . Calcium Carbonate-Vitamin D (CALCIUM + D PO) Take 2 tablets by mouth daily.     Marland Kitchen estradiol (ESTRACE) 0.5 MG tablet Take 1/2 tab po daily. 45 tablet 3  . KRILL OIL PO Take 1 tablet by mouth daily.    . metoprolol tartrate (LOPRESSOR) 50 MG tablet TAKE 1 TABLET BY MOUTH 2 TIMES DAILY. 180 tablet 1  . Multiple Vitamin (MULTIVITAMIN) tablet Take 1 tablet by mouth daily.      Marland Kitchen telmisartan-hydrochlorothiazide (MICARDIS HCT) 80-25 MG tablet TAKE 1 TABLET DAILY BY MOUTH. 90 tablet 1  . vitamin C (ASCORBIC ACID) 500 MG tablet Take 500 mg by mouth daily.    Marland Kitchen Zoster Vaccine Adjuvanted [facility]) injection Inject 0.5mg  IM now and again in 2-6 months. 0.5 mL 1   No current facility-administered medications on file prior to visit.     BP (!) 178/100 (BP Location: Right Arm, Patient Position: Sitting, Cuff Size: Small)   Pulse 63   Temp (!) 96.4 F (35.8 C) (Temporal)   Resp 16   Ht 5\' 3"  (1.6 m)   Wt 158 lb (71.7 kg)   LMP 03/04/2002   SpO2 99%   BMI 27.99 kg/m       Objective:   Physical Exam Constitutional:      Appearance: She is well-developed.  Cardiovascular:     Rate and Rhythm: Normal rate and regular rhythm.     Heart sounds: Normal heart sounds. No murmur.  Pulmonary:     Effort: Pulmonary effort is normal. No respiratory distress.     Breath sounds: Normal breath sounds. No wheezing.  Psychiatric:        Behavior: Behavior normal.        Thought Content: Thought content normal.        Judgment: Judgment normal.           Assessment & Plan:  HTN- bp is elevated. She reports improved readings at home.  I have asked her to check bp once daily for the next few days and call me with her bp readings next week. Continue current meds/doses for now.  Hyperlipidemia- obtain follow up lipid  panel.  She is due for follow up colonoscopy and will contact her GI specialist to schedule (Dr. Collene Mares).  Declines flu shot, would like pneumovax.

## 2018-11-23 ENCOUNTER — Telehealth: Payer: Self-pay | Admitting: Family

## 2018-11-23 ENCOUNTER — Encounter: Payer: Self-pay | Admitting: Family

## 2018-11-23 DIAGNOSIS — R739 Hyperglycemia, unspecified: Secondary | ICD-10-CM

## 2018-11-23 DIAGNOSIS — E876 Hypokalemia: Secondary | ICD-10-CM

## 2018-11-23 MED ORDER — POTASSIUM CHLORIDE 20 MEQ/15ML (10%) PO SOLN: 20.0000 meq | Freq: Every day | ORAL | 2 refills | Status: DC

## 2018-11-23 NOTE — Telephone Encounter (Signed)
Please advise pt that sugar and triglycerides were elevated. Please work on avoiding concentrated sweets, and limiting white carbs (rice/bread/pasta/potatoes). Instead substitute whole grain versions with reasonable portions.  Can lab add on A1C?  If not, I would like to have her return to lab for A1C dx hyperglycemia.

## 2018-11-23 NOTE — Telephone Encounter (Signed)
Also, potassium is low.  I would like her to add kdur once daily and repeat bmet in 1 week.   I will send potassium liquid, I see that she has had some issues with "burning in the kidney area" in the past on kdur.

## 2018-11-23 NOTE — Telephone Encounter (Signed)
Patient notified of results.  She will be in next Monday to recheck bmp and to do a1c.

## 2018-11-23 NOTE — Addendum Note (Signed)
Addended by: Kem Boroughs D on: 11/23/2018 11:03 AM   Modules accepted: Orders

## 2018-11-23 NOTE — Telephone Encounter (Signed)
Lvm for patient to call back about her results 

## 2018-12-01 ENCOUNTER — Other Ambulatory Visit (INDEPENDENT_AMBULATORY_CARE_PROVIDER_SITE_OTHER): Payer: Medicare Other

## 2018-12-01 ENCOUNTER — Other Ambulatory Visit: Payer: Self-pay

## 2018-12-01 DIAGNOSIS — R739 Hyperglycemia, unspecified: Secondary | ICD-10-CM | POA: Diagnosis not present

## 2018-12-01 DIAGNOSIS — E876 Hypokalemia: Secondary | ICD-10-CM | POA: Diagnosis not present

## 2018-12-01 LAB — BASIC METABOLIC PANEL
BUN: 10 mg/dL (ref 6–23)
CO2: 31 mEq/L (ref 19–32)
Calcium: 9.1 mg/dL (ref 8.4–10.5)
Chloride: 99 mEq/L (ref 96–112)
Creatinine, Ser: 0.73 mg/dL (ref 0.40–1.20)
GFR: 78.62 mL/min (ref >60.00)
Glucose, Bld: 119 mg/dL — ABNORMAL HIGH (ref 70–99)
Potassium: 3.4 mEq/L — ABNORMAL LOW (ref 3.5–5.1)
Sodium: 140 mEq/L (ref 135–145)

## 2018-12-01 LAB — HEMOGLOBIN A1C: Hgb A1c MFr Bld: 6 % (ref 4.6–6.5)

## 2018-12-02 ENCOUNTER — Telehealth: Payer: Self-pay | Admitting: Family

## 2018-12-02 NOTE — Telephone Encounter (Signed)
Sugar has risen slightly but remains at goal.  Potassium is slightly low. I would like her to take her potassium twice daily today, then resume once daily dosing tomorrow.

## 2018-12-02 NOTE — Telephone Encounter (Signed)
Patient called back, advised of results and provider's advise.

## 2018-12-02 NOTE — Telephone Encounter (Signed)
Lvm for patient to call back about resutls 

## 2018-12-04 ENCOUNTER — Telehealth: Payer: Self-pay | Admitting: Family

## 2018-12-04 NOTE — Telephone Encounter (Signed)
Patient advised to continue same management and to return in 3 months with her home bp machine.

## 2018-12-04 NOTE — Telephone Encounter (Signed)
Patient dropped off blood pressure readings at the front desk:  9/21 132/84 9/22 129/78 9/23 130/82 9/24 122/83 9/25 118/81 9/26 119/79 9/27 121/82 9/28 124/84 9/29 119/82  Please advise pt that I reviewed her blood pressure readings and they look good. Please plan to follow up in the office in 3 months and bring her home blood pressure meter with her to her appointment.

## 2018-12-04 NOTE — Telephone Encounter (Signed)
Lvm for patient to call me back

## 2018-12-04 NOTE — Telephone Encounter (Signed)
Pt called to return Ruths call. Please advise.

## 2019-02-19 ENCOUNTER — Ambulatory Visit: Payer: Medicare Other | Admitting: Family

## 2019-03-17 ENCOUNTER — Other Ambulatory Visit: Payer: Self-pay | Admitting: Women's Health

## 2019-03-17 DIAGNOSIS — Z1231 Encounter for screening mammogram for malignant neoplasm of breast: Secondary | ICD-10-CM

## 2019-03-22 ENCOUNTER — Other Ambulatory Visit: Payer: Self-pay

## 2019-03-23 ENCOUNTER — Ambulatory Visit: Payer: Medicare PPO | Admitting: Family

## 2019-03-23 ENCOUNTER — Other Ambulatory Visit: Payer: Self-pay

## 2019-03-23 ENCOUNTER — Encounter: Payer: Self-pay | Admitting: Family

## 2019-03-23 VITALS — BP 148/89 | HR 68 | Temp 95.5°F | Resp 16 | Ht 64.0 in | Wt 158.0 lb

## 2019-03-23 DIAGNOSIS — E781 Pure hyperglyceridemia: Secondary | ICD-10-CM | POA: Diagnosis not present

## 2019-03-23 DIAGNOSIS — I10 Essential (primary) hypertension: Secondary | ICD-10-CM | POA: Diagnosis not present

## 2019-03-23 DIAGNOSIS — E559 Vitamin D deficiency, unspecified: Secondary | ICD-10-CM

## 2019-03-23 DIAGNOSIS — R739 Hyperglycemia, unspecified: Secondary | ICD-10-CM

## 2019-03-23 LAB — BASIC METABOLIC PANEL
BUN: 12 mg/dL (ref 6–23)
CO2: 32 mEq/L (ref 19–32)
Calcium: 9 mg/dL (ref 8.4–10.5)
Chloride: 98 mEq/L (ref 96–112)
Creatinine, Ser: 0.81 mg/dL (ref 0.40–1.20)
GFR: 69.67 mL/min (ref >60.00)
Glucose, Bld: 154 mg/dL — ABNORMAL HIGH (ref 70–99)
Potassium: 3.5 mEq/L (ref 3.5–5.1)
Sodium: 138 mEq/L (ref 135–145)

## 2019-03-23 LAB — HEMOGLOBIN A1C: Hgb A1c MFr Bld: 6.1 % (ref 4.6–6.5)

## 2019-03-23 MED ORDER — METOPROLOL SUCCINATE ER 100 MG PO TB24: 100.0000 mg | ORAL_TABLET | Freq: Every day | ORAL | 1 refills | Status: DC

## 2019-03-23 MED ORDER — TELMISARTAN-HCTZ 80-25 MG PO TABS: 1.0000 | ORAL_TABLET | Freq: Every day | ORAL | 1 refills | Status: DC

## 2019-03-23 NOTE — Progress Notes (Signed)
Subjective:    Patient ID: [name], female    DOB: 1945-05-08, 74 y.o.   MRN: [id number]  HPI   Patient is a 74 yr old female who presents today for follow up.  HTN- maintained on metoprolol, micardis hct, she is taking an otc supplement (potassium) and not the kdur.   Has been snacking less.   Wt Readings from Last 3 Encounters:  03/23/19 158 lb (71.7 kg)  11/20/18 158 lb (71.7 kg)  08/31/18 161 lb (73 kg)    BP Readings from Last 3 Encounters:  03/23/19 (!) 148/89  11/20/18 (!) 168/95  08/31/18 119/83   Vit D deficiency- on caltrate + D  Hypertriglyceridemia-  Lab Results  Component Value Date   CHOL 192 11/20/2018   HDL 76.20 11/20/2018   LDLDIRECT 86.0 11/20/2018   TRIG 243.0 (H) 11/20/2018   CHOLHDL 3 11/20/2018   Hyperglycemia-  Lab Results  Component Value Date   HGBA1C 6.0 12/01/2018   Review of Systems  See HPI  Past Medical History:  Diagnosis Date  . Cervical dysplasia   . Endometrial hyperplasia   . Fibroid    Atypical leiomyoma  . History of colon polyps   . Hyperglycemia 05/23/2014  . Hyperglycemia   . Hypertension      Social History   Socioeconomic History  . Marital status: Married    Spouse name: Not on file  . Number of children: Not on file  . Years of education: Not on file  . Highest education level: Not on file  Occupational History  . Not on file  Tobacco Use  . Smoking status: Former Smoker    Packs/day: 0.25    Years: 30.00    Pack years: 7.50    Types: Cigarettes    Quit date: 2013    Years since quitting: 8.0  . Smokeless tobacco: Never Used  Substance and Sexual Activity  . Alcohol use: Yes    Alcohol/week: 0.0 standard drinks    Comment: 2-4 glasses of wine on weekends  . Drug use: No  . Sexual activity: Yes    Birth control/protection: Surgical  Other Topics Concern  . 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   Enjoys reading, yard work, cares for her mother during the day, her brother helps during then night, enjoys poker   1 daughter (in [place]) and 2 grand-daughters (In [place]- age 46 and 58)   Social Determinants of Health   Financial Resource Strain:   . Difficulty of Paying Living Expenses: Not on file  Food Insecurity:   . Worried About Charity fundraiser in the Last Year: Not on file  . Ran Out of Food in the Last Year: Not on file  Transportation Needs:   . Lack of Transportation (Medical): Not on file  . Lack of Transportation (Non-Medical): Not on file  Physical Activity:   . Days of Exercise per Week: Not on file  . Minutes of Exercise per Session: Not on file  Stress:   . Feeling of Stress : Not on file  Social Connections:   . Frequency of Communication with Friends and Family: Not on file  . Frequency of Social Gatherings with Friends and Family: Not on file  . Attends Religious Services: Not on file  .  Active Member of Clubs or Organizations: Not on file  . Attends Archivist Meetings: Not on file  . Marital Status: Not on file  Intimate Partner Violence:   . Fear of Current or Ex-Partner: Not on file  . Emotionally Abused: Not on file  . Physically Abused: Not on file  . Sexually Abused: Not on file    Past Surgical History:  Procedure Laterality Date  . ABDOMINAL HYSTERECTOMY  2004   TAH_BSO  . BREAST BIOPSY Left 2000  . BREAST BIOPSY Bilateral 1999  . BREAST EXCISIONAL BIOPSY Left    benign  . BREAST SURGERY  1998   Benign left breast lump  . COLPOSCOPY    . EXCISION MASS LOWER EXTREMETIES Right 05/30/2016   Procedure: EXCISION RIGHT THIGH MASS;  Surgeon: Erroll Luna, MD;  Location: [facility];  Service: General;  Laterality: Right;  EXCISION RIGHT THIGH MASS  . HEMORRHOID SURGERY  1980's  . LYMPH NODE BIOPSY Left 1975   ? "removed lymph nodes in left leg"--benign per pt  .  OOPHORECTOMY     BSO  . TONSILLECTOMY  1990    Family History  Problem Relation Age of Onset  . Hypertension Mother   . Heart disease Mother   . Diabetes Mother   . Breast cancer Mother 68  . Heart disease Father   . Cancer Father        prostate  . Cancer Sister 19       Leukemia, deceased  . Breast cancer Maternal Aunt        over 24  . Breast cancer Maternal Grandmother        over 3  . Hypertension Brother   . Diabetes Brother   . Stroke Brother     Allergies  Allergen Reactions  . Atorvastatin     Muscle cramps  . Fenofibrate     "Locked her muscles up"  . Hydralazine     "kidney pain"  . Potassium-Containing Compounds Other (See Comments)    Burning  sensation in kidney  area    Current Outpatient Medications on File Prior to Visit  Medication Sig Dispense Refill  . Calcium Carbonate-Vitamin D (CALCIUM + D PO) Take 2 tablets by mouth daily.     Marland Kitchen estradiol (ESTRACE) 0.5 MG tablet Take 1/2 tab po daily. 45 tablet 3  . KRILL OIL PO Take 1 tablet by mouth daily.    . metoprolol tartrate (LOPRESSOR) 50 MG tablet Take 1 tablet (50 mg total) by mouth 2 (two) times daily. 180 tablet 1  . Multiple Vitamin (MULTIVITAMIN) tablet Take 1 tablet by mouth daily.      . potassium chloride 20 MEQ/15ML (10%) SOLN Take 15 mLs (20 mEq total) by mouth daily. 473 mL 2  . telmisartan-hydrochlorothiazide (MICARDIS HCT) 80-25 MG tablet Take 1 tablet by mouth daily. 90 tablet 1  . vitamin C (ASCORBIC ACID) 500 MG tablet Take 500 mg by mouth daily.     No current facility-administered medications on file prior to visit.    BP (!) 148/89 (BP Location: Right Arm, Patient Position: Sitting, Cuff Size: Small)   Pulse 68   Temp (!) 95.5 F (35.3 C) (Temporal)   Resp 16   Ht 5\' 4"  (1.626 m)   Wt 158 lb (71.7 kg)   LMP 03/04/2002   SpO2 99%   BMI 27.12 kg/m       Objective:   Physical Exam Constitutional:      Appearance: She  is well-developed.  Cardiovascular:     Rate  and Rhythm: Normal rate and regular rhythm.     Heart sounds: Normal heart sounds. No murmur.  Pulmonary:     Effort: Pulmonary effort is normal. No respiratory distress.     Breath sounds: Normal breath sounds. No wheezing.  Psychiatric:        Behavior: Behavior normal.        Thought Content: Thought content normal.        Judgment: Judgment normal.           Assessment & Plan:  HTN- bp acceptable for her age, continue current meds.  Hypertriglyceridemia- (uncontrolled). Reinforced dietary changes.   Vit D deficiency- check follow up vit D level. Continue caltrate with D.  Hyperglycemia- obtain follow up A1C.   This visit occurred during the SARS-CoV-2 public health emergency.  Safety protocols were in place, including screening questions prior to the visit, additional usage of staff PPE, and extensive cleaning of exam room while observing appropriate contact time as indicated for disinfecting solutions.

## 2019-03-23 NOTE — Patient Instructions (Signed)
Please complete lab work prior to leaving.   

## 2019-03-25 LAB — VITAMIN D 1,25 DIHYDROXY
Vitamin D 1, 25 (OH)2 Total: 28 pg/mL (ref 18–72)
Vitamin D2 1, 25 (OH)2: <8 pg/mL
Vitamin D3 1, 25 (OH)2: 28 pg/mL

## 2019-03-29 ENCOUNTER — Encounter: Payer: Self-pay | Admitting: Family

## 2019-04-23 ENCOUNTER — Ambulatory Visit: Payer: Medicare Other

## 2019-05-06 ENCOUNTER — Ambulatory Visit: Payer: Medicare PPO | Attending: Internal Medicine

## 2019-05-06 DIAGNOSIS — Z23 Encounter for immunization: Secondary | ICD-10-CM

## 2019-05-06 NOTE — Progress Notes (Signed)
   Covid-19 Vaccination Clinic  Name:  [name]    MRN: [id number] DOB: [birth year]  05/06/2019  [name] was observed post Covid-19 immunization for 30 minutes based on pre-vaccination screening without incident. She was provided with Vaccine Information Sheet and instruction to access the V-Safe system.   [name] was instructed to call 911 with any severe reactions post vaccine: Marland Kitchen Difficulty breathing  . Swelling of face and throat  . A fast heartbeat  . A bad rash all over body  . Dizziness and weakness   Immunizations Administered    Name Date Dose VIS Date Route   Pfizer COVID-19 Vaccine 05/06/2019  3:01 PM 0.3 mL 02/12/2019 Intramuscular   Manufacturer: [place]   Lot: [id number]   [place]: [id number]

## 2019-05-28 ENCOUNTER — Ambulatory Visit: Payer: Medicare PPO

## 2019-06-02 ENCOUNTER — Ambulatory Visit: Payer: Medicare PPO | Attending: Internal Medicine

## 2019-06-02 DIAGNOSIS — Z23 Encounter for immunization: Secondary | ICD-10-CM

## 2019-06-02 NOTE — Progress Notes (Signed)
   Covid-19 Vaccination Clinic  Name:  [name]    MRN: [id number] DOB: 25-Dec-1945  06/02/2019  [name] was observed post Covid-19 immunization for 15 minutes without incident. She was provided with Vaccine Information Sheet and instruction to access the V-Safe system.   [name] was instructed to call 911 with any severe reactions post vaccine: Marland Kitchen Difficulty breathing  . Swelling of face and throat  . A fast heartbeat  . A bad rash all over body  . Dizziness and weakness   Immunizations Administered    Name Date Dose VIS Date Route   Pfizer COVID-19 Vaccine 06/02/2019 10:27 AM 0.3 mL 02/12/2019 Intramuscular   Manufacturer: [place]   Lot: [id number]   [place]: [id number]

## 2019-06-08 ENCOUNTER — Other Ambulatory Visit: Payer: Self-pay | Admitting: Women's Health

## 2019-06-08 MED ORDER — ACYCLOVIR 200 MG PO CAPS: 200.0000 mg | ORAL_CAPSULE | Freq: Every day | ORAL | 6 refills | Status: DC

## 2019-06-16 LAB — HM COLONOSCOPY

## 2019-06-22 ENCOUNTER — Telehealth: Payer: Self-pay | Admitting: Family

## 2019-06-22 ENCOUNTER — Ambulatory Visit: Payer: Medicare PPO | Admitting: Family

## 2019-06-22 ENCOUNTER — Encounter: Payer: Self-pay | Admitting: Family

## 2019-06-22 ENCOUNTER — Other Ambulatory Visit: Payer: Self-pay

## 2019-06-22 VITALS — BP 156/86 | HR 66 | Temp 97.5°F | Resp 16 | Ht 64.0 in | Wt 155.4 lb

## 2019-06-22 DIAGNOSIS — R739 Hyperglycemia, unspecified: Secondary | ICD-10-CM

## 2019-06-22 DIAGNOSIS — I10 Essential (primary) hypertension: Secondary | ICD-10-CM | POA: Diagnosis not present

## 2019-06-22 DIAGNOSIS — E559 Vitamin D deficiency, unspecified: Secondary | ICD-10-CM | POA: Diagnosis not present

## 2019-06-22 NOTE — Progress Notes (Signed)
Subjective:    Patient ID: [name], female    DOB: 09-Nov-1945, 74 y.o.   MRN: [id number]  HPI  Patient is a 74 yr old female who presents today for follow up.  HTN- reports home readings 130's/80's.   BP Readings from Last 3 Encounters:  06/22/19 (!) 156/86  03/23/19 (!) 148/89  11/20/18 (!) 168/95   Vit D deficiency- last visit vitamin D was WNL.   Hyperglycemia-  Lab Results  Component Value Date   HGBA1C 6.1 03/23/2019        Review of Systems See HPI  Past Medical History:  Diagnosis Date  . Cervical dysplasia   . Endometrial hyperplasia   . Fibroid    Atypical leiomyoma  . History of colon polyps   . Hyperglycemia 05/23/2014  . Hyperglycemia   . Hypertension      Social History   Socioeconomic History  . Marital status: Married    Spouse name: Not on file  . Number of children: Not on file  . Years of education: Not on file  . Highest education level: Not on file  Occupational History  . Not on file  Tobacco Use  . Smoking status: Former Smoker    Packs/day: 0.25    Years: 30.00    Pack years: 7.50    Types: Cigarettes    Quit date: 2013    Years since quitting: 8.3  . Smokeless tobacco: Never Used  Substance and Sexual Activity  . Alcohol use: Yes    Alcohol/week: 0.0 standard drinks    Comment: 2-4 glasses of wine on weekends  . Drug use: No  . Sexual activity: Yes    Birth control/protection: Surgical  Other Topics Concern  . 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   Enjoys reading, yard work, cares for her mother during the day, her brother helps during then night, enjoys poker   1 daughter (in [place]) and 2 grand-daughters (In [place]- age 24 and 65)   Social Determinants of Health   Financial Resource Strain:   . Difficulty of Paying Living Expenses:   Food Insecurity:    . Worried About Charity fundraiser in the Last Year:   . Arboriculturist in the Last Year:   Transportation Needs:   . Film/video editor (Medical):   Marland Kitchen Lack of Transportation (Non-Medical):   Physical Activity:   . Days of Exercise per Week:   . Minutes of Exercise per Session:   Stress:   . Feeling of Stress :   Social Connections:   . Frequency of Communication with Friends and Family:   . Frequency of Social Gatherings with Friends and Family:   . Attends Religious Services:   . Active Member of Clubs or Organizations:   . Attends Archivist Meetings:   Marland Kitchen Marital Status:   Intimate Partner Violence:   . Fear of Current or Ex-Partner:   . Emotionally Abused:   Marland Kitchen Physically Abused:   . Sexually Abused:     Past Surgical History:  Procedure Laterality Date  . ABDOMINAL HYSTERECTOMY  2004   TAH_BSO  . BREAST BIOPSY Left 2000  . BREAST BIOPSY Bilateral 1999  . BREAST EXCISIONAL BIOPSY Left    benign  . BREAST SURGERY  1998   Benign left breast lump  . COLPOSCOPY    . EXCISION MASS LOWER EXTREMETIES Right 05/30/2016   Procedure: EXCISION RIGHT THIGH MASS;  Surgeon: Erroll Luna, MD;  Location: [facility];  Service: General;  Laterality: Right;  EXCISION RIGHT THIGH MASS  . HEMORRHOID SURGERY  1980's  . LYMPH NODE BIOPSY Left 1975   ? "removed lymph nodes in left leg"--benign per pt  . OOPHORECTOMY     BSO  . TONSILLECTOMY  1990    Family History  Problem Relation Age of Onset  . Hypertension Mother   . Heart disease Mother   . Diabetes Mother   . Breast cancer Mother 7  . Heart disease Father   . Cancer Father        prostate  . Cancer Sister 44       Leukemia, deceased  . Breast cancer Maternal Aunt        over 5  . Breast cancer Maternal Grandmother        over 24  . Hypertension Brother   . Diabetes Brother   . Stroke Brother     Allergies  Allergen Reactions  . Atorvastatin     Muscle cramps  . Fenofibrate     "Locked her muscles  up"  . Hydralazine     "kidney pain"  . Potassium-Containing Compounds Other (See Comments)    Burning  sensation in kidney  area    Current Outpatient Medications on File Prior to Visit  Medication Sig Dispense Refill  . acyclovir (ZOVIRAX) 200 MG capsule Take 1 capsule (200 mg total) by mouth 5 (five) times daily. 30 capsule 6  . Calcium Carbonate-Vitamin D (CALCIUM + D PO) Take 2 tablets by mouth daily.     Marland Kitchen estradiol (ESTRACE) 0.5 MG tablet Take 1/2 tab po daily. 45 tablet 3  . KRILL OIL PO Take 1 tablet by mouth daily.    . metoprolol succinate (TOPROL-XL) 100 MG 24 hr tablet Take 1 tablet (100 mg total) by mouth daily. Take with or immediately following a meal. 90 tablet 1  . Multiple Vitamin (MULTIVITAMIN) tablet Take 1 tablet by mouth daily.      Marland Kitchen telmisartan-hydrochlorothiazide (MICARDIS HCT) 80-25 MG tablet Take 1 tablet by mouth daily. 90 tablet 1  . vitamin C (ASCORBIC ACID) 500 MG tablet Take 500 mg by mouth daily.     No current facility-administered medications on file prior to visit.    BP (!) 156/86 (BP Location: Right Arm, Patient Position: Sitting, Cuff Size: Small)   Pulse 66   Temp (!) 97.5 F (36.4 C) (Temporal)   Resp 16   Ht 5\' 4"  (1.626 m)   Wt 155 lb 6.4 oz (70.5 kg)   LMP 03/04/2002   SpO2 100%   BMI 26.67 kg/m       Objective:   Physical Exam Constitutional:      Appearance: She is well-developed.  Neck:     Thyroid: No thyromegaly.  Cardiovascular:     Rate and Rhythm: Normal rate and regular rhythm.     Heart sounds: Normal heart sounds. No murmur.  Pulmonary:     Effort: Pulmonary effort is normal. No respiratory distress.     Breath sounds: Normal breath sounds. No wheezing.  Musculoskeletal:     Cervical back: Neck supple.  Skin:    General: Skin is warm and dry.  Neurological:     Mental Status: She is alert and oriented to person, place,  and time.  Psychiatric:        Behavior: Behavior normal.        Thought Content: Thought  content normal.        Judgment: Judgment normal.           Assessment & Plan:  HTN- bp tends to run high in the office.  Better at home. Continue current meds and monitoring of bp at home.    Hyperglycemia- borderline DM. Continue diabetic diet.  Vitamin D deficiency- continue Caltrate with D. Stable.  This visit occurred during the SARS-CoV-2 public health emergency.  Safety protocols were in place, including screening questions prior to the visit, additional usage of staff PPE, and extensive cleaning of exam room while observing appropriate contact time as indicated for disinfecting solutions.

## 2019-06-22 NOTE — Telephone Encounter (Signed)
Can you please call Dr. Lorie Apley office and request copy of her colonoscopy report?  tks

## 2019-06-22 NOTE — Patient Instructions (Signed)
Continue to monitor your blood pressure at home. Call if you start seeing readings >150/90.

## 2019-06-23 NOTE — Telephone Encounter (Signed)
Records released faxed to Dr [facility] office

## 2019-07-21 ENCOUNTER — Ambulatory Visit
Admission: RE | Admit: 2019-07-21 | Discharge: 2019-07-21 | Disposition: A | Discharge status: Home / Self Care | Payer: Medicare PPO | Source: Ambulatory Visit | Attending: Women's Health | Admitting: Women's Health

## 2019-07-21 ENCOUNTER — Other Ambulatory Visit: Payer: Self-pay

## 2019-07-21 ENCOUNTER — Other Ambulatory Visit: Payer: Self-pay | Admitting: Nurse Practitioner

## 2019-07-21 DIAGNOSIS — Z1231 Encounter for screening mammogram for malignant neoplasm of breast: Secondary | ICD-10-CM

## 2019-08-23 NOTE — Progress Notes (Unsigned)
I connected with [name] today by telephone and verified that I am speaking with the correct person using two identifiers. Location patient: home Location provider: work Persons participating in the virtual visit: patient, Marine scientist.    I discussed the limitations, risks, security and privacy concerns of performing an evaluation and management service by telephone and the availability of in person appointments. I also discussed with the patient that there may be a patient responsible charge related to this service. The patient expressed understanding and verbally consented to this telephonic visit.    Interactive audio and video telecommunications were attempted between this provider and patient, however failed, due to patient having technical difficulties OR patient did not have access to video capability.  We continued and completed visit with audio only.  Some vital signs may be absent or patient reported.   Subjective:   [name] is a 74 y.o. female who presents for Medicare Annual (Subsequent) preventive examination.  Review of Systems    Cardiac Risk Factors include: advanced age (>4men, >83 women);hypertension     Objective:    Today's Vitals   08/24/19 1004  BP: 140/83  Pulse: 83  Weight: 151 lb (68.5 kg)   Body mass index is 25.92 kg/m.  Advanced Directives 08/24/2019 10/16/2017 09/23/2016 05/31/2016 05/30/2016 05/24/2016 09/22/2015  Does Patient Have a Medical Advance Directive? Yes Yes Yes Yes - Yes Yes  Type of Advance Directive [place];Living will [place];Living will [place];Living will Living will - Living will [place];Living will  Does patient want to make changes to medical advance directive? No - Patient declined - - - No - Patient declined No - Patient declined -  Copy of [place] in Chart? No - copy requested No - copy requested No - copy requested - - - -     Current Medications (verified) Outpatient Encounter Medications as of 08/24/2019  Medication Sig  . Calcium Carbonate-Vitamin D (CALCIUM + D PO) Take 2 tablets by mouth daily.   Marland Kitchen estradiol (ESTRACE) 0.5 MG tablet Take 1/2 tab po daily.  Marland Kitchen KRILL OIL PO Take 1 tablet by mouth daily.  . metoprolol succinate (TOPROL-XL) 100 MG 24 hr tablet Take 1 tablet (100 mg total) by mouth daily. Take with or immediately following a meal.  . Multiple Vitamin (MULTIVITAMIN) tablet Take 1 tablet by mouth daily.    Marland Kitchen telmisartan-hydrochlorothiazide (MICARDIS HCT) 80-25 MG tablet Take 1 tablet by mouth daily.  . vitamin C (ASCORBIC ACID) 500 MG tablet Take 500 mg by mouth daily.  Marland Kitchen acyclovir (ZOVIRAX) 200 MG capsule Take 1 capsule (200 mg total) by mouth 5 (five) times daily. (Patient not taking: Reported on 08/24/2019)   No facility-administered encounter medications on file as of 08/24/2019.    Allergies (verified) Atorvastatin, Fenofibrate, Hydralazine, Potassium-containing compounds, and Latex   History: Past Medical History:  Diagnosis Date  . Cervical dysplasia   . Endometrial hyperplasia   . Fibroid    Atypical leiomyoma  . History of colon polyps   . Hyperglycemia 05/23/2014  . Hyperglycemia   . Hypertension    Past Surgical History:  Procedure Laterality Date  . ABDOMINAL HYSTERECTOMY  2004   TAH_BSO  . BREAST BIOPSY Left 2000  . BREAST BIOPSY Bilateral 1999  . BREAST EXCISIONAL BIOPSY Left    benign  . BREAST SURGERY  1998   Benign left breast lump  . COLPOSCOPY    . EXCISION MASS  LOWER EXTREMETIES Right 05/30/2016   Procedure: EXCISION RIGHT THIGH MASS;  Surgeon: Erroll Luna, MD;  Location: [facility];  Service: General;  Laterality: Right;  EXCISION RIGHT THIGH MASS  . HEMORRHOID SURGERY  1980's  . LYMPH NODE BIOPSY Left 1975   ? "removed lymph nodes in left leg"--benign per pt  . OOPHORECTOMY     BSO  . TONSILLECTOMY  1990   Family History  Problem Relation Age of Onset  .  Hypertension Mother   . Heart disease Mother   . Diabetes Mother   . Breast cancer Mother 86  . Heart disease Father   . Cancer Father        prostate  . Cancer Sister 63       Leukemia, deceased  . Breast cancer Maternal Aunt        over 56  . Breast cancer Maternal Grandmother        over 89  . Hypertension Brother   . Diabetes Brother   . Stroke Brother    Social History   Socioeconomic History  . Marital status: Married    Spouse name: Not on file  . Number of children: Not on file  . Years of education: Not on file  . Highest education level: Not on file  Occupational History  . Not on file  Tobacco Use  . Smoking status: Former Smoker    Packs/day: 0.25    Years: 30.00    Pack years: 7.50    Types: Cigarettes    Quit date: 2013    Years since quitting: 8.4  . Smokeless tobacco: Never Used  Vaping Use  . Vaping Use: Never used  Substance and Sexual Activity  . Alcohol use: Yes    Alcohol/week: 0.0 standard drinks    Comment: 2-4 glasses of wine on weekends  . Drug use: No  . Sexual activity: Yes    Birth control/protection: Surgical  Other Topics Concern  . 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   Enjoys reading, yard work, cares for her mother during the day, her brother helps during then night, enjoys poker   1 daughter (in [place]) and 2 grand-daughters (In [place]- age 66 and 61)   Social Determinants of Health   Financial Resource Strain: [place]   . Difficulty of Paying Living Expenses: Not hard at all  Food Insecurity: No Food Insecurity  . Worried About Charity fundraiser in the Last Year: Never true  . Ran Out of Food in the Last Year: Never true  Transportation Needs: No Transportation Needs  . Lack of Transportation (Medical): No  . Lack of Transportation (Non-Medical): No  Physical  Activity:   . Days of Exercise per Week:   . Minutes of Exercise per Session:   Stress:   . Feeling of Stress :   Social Connections:   . Frequency of Communication with Friends and Family:   . Frequency of Social Gatherings with Friends and Family:   . Attends Religious Services:   . Active Member of Clubs or Organizations:   . Attends Archivist Meetings:   Marland Kitchen Marital Status:     Tobacco Counseling Counseling given: Not Answered   Clinical Intake: Pain : No/denies pain    Activities of Daily Living In  your present state of health, do you have any difficulty performing the following activities: 08/24/2019 03/23/2019  Hearing? N N  Vision? N N  Difficulty concentrating or making decisions? N N  Walking or climbing stairs? N N  Dressing or bathing? N N  Doing errands, shopping? N N  Preparing Food and eating ? N -  Using the Toilet? N -  In the past six months, have you accidently leaked urine? N -  Do you have problems with loss of bowel control? N -  Managing your Medications? N -  Managing your Finances? N -  Housekeeping or managing your Housekeeping? N -  Some recent data might be hidden    Patient Care Team: Debbrah Alar, NP as PCP - General (Internal Medicine) Juanita Craver, MD as Consulting Physician (Gastroenterology) Charolotte Capuchin, MD as Consulting Physician (Dentistry) Tamela Gammon, NP as Nurse Practitioner (Gynecology)  Indicate any recent Medical Services you may have received from other than [facility] providers in the past year (date may be approximate).     Assessment:   This is a routine wellness examination for [place].  Hearing/Vision screen Unable to assess. This visit is enabled though telemedicine due to Covid 19.   Dietary issues and exercise activities discussed: Current Exercise Habits: The patient does not participate in regular exercise at present, Exercise limited by: None identified Diet (meal preparation, eat out,  water intake, caffeinated beverages, dairy products, fruits and vegetables): well balanced     Goals    . Patient Stated     Decrease stress through reading, crafts, social time with friends. Increase physical activity as time allows. Maintain current state of health/no declines in health over the next year.      Depression Screen PHQ 2/9 Scores 08/24/2019 03/23/2019 10/16/2017 09/23/2016 09/22/2015 09/19/2014 09/19/2014  PHQ - 2 Score 0 2 0 0 1 0 0  PHQ- 9 Score - 2 - - - - -    Fall Risk Fall Risk  08/24/2019 10/16/2017 09/23/2016 09/22/2015 09/19/2014  Falls in the past year? 0 No No No No  Number falls in past yr: 0 - - - -  Injury with Fall? 0 - - - -  Follow up Education provided;Falls prevention discussed - - - -    Any stairs in or around the home? Yes  If so, are there any without handrails? No  Home free of loose throw rugs in walkways, pet beds, electrical cords, etc? Yes  Adequate lighting in your home to reduce risk of falls? Yes   ASSISTIVE DEVICES UTILIZED TO PREVENT FALLS:  Life alert? No  Use of a cane, walker or w/c? No  Grab bars in the bathroom? Yes  Shower chair or bench in shower? No  Elevated toilet seat or a handicapped toilet? No    Cognitive Function: Ad8 score reviewed for issues:  Issues making decisions:no  Less interest in hobbies / activities:no  Repeats questions, stories (family complaining):no  Trouble using ordinary gadgets (microwave, computer, phone):no  Forgets the month or year: no  Mismanaging finances: no  Remembering appts:no  Daily problems with thinking and/or memory:no Ad8 score is=0     MMSE - Mini Mental State Exam 09/22/2015  Orientation to time 5  Orientation to Place 5  Registration 3  Attention/ Calculation 5  Recall 3  Language- name 2 objects 2  Language- repeat 1  Language- follow 3 step command 3  Language- read & follow direction 1  Write a sentence 1  Copy design 1  Total score 30         Immunizations Immunization History  Administered Date(s) Administered  . PFIZER SARS-COV-2 Vaccination 05/06/2019, 06/02/2019  . Pneumococcal Polysaccharide-23 11/20/2018  . Td 09/19/2014  . Zoster 03/04/2012    TDAP status: Up to date Flu Vaccine status: Declined, Education has been provided regarding the importance of this vaccine but patient still declined. Advised may receive this vaccine at local pharmacy or Health Dept. Aware to provide a copy of the vaccination record if obtained from local pharmacy or Health Dept. Verbalized acceptance and understanding. Pneumococcal vaccine status: Up to date Covid-19 vaccine status: Completed vaccines  Qualifies for Shingles Vaccine?   Zostavax completed Yes   Shingrix Completed?: No.    Education has been provided regarding the importance of this vaccine. Patient has been advised to call insurance company to determine out of pocket expense if they have not yet received this vaccine. Advised may also receive vaccine at local pharmacy or Health Dept. Verbalized acceptance and understanding.  Screening Tests Health Maintenance  Topic Date Due  . Hepatitis C Screening  Never done  . INFLUENZA VACCINE  10/03/2019  . PNA vac Low Risk Adult (2 of 2 - PCV13) 11/20/2019  . MAMMOGRAM  07/20/2021  . COLONOSCOPY  06/15/2024  . TETANUS/TDAP  09/18/2024  . DEXA SCAN  Completed  . COVID-19 Vaccine  Completed    Health Maintenance  Health Maintenance Due  Topic Date Due  . Hepatitis C Screening  Never done    Colorectal cancer screening: Completed 06/16/19. Repeat every 3 years Mammogram status: Completed 07/22/19. Repeat every year Bone Density status: Completed 10/16/16. Results reflect: Bone density results: NORMAL. Repeat every 2 years. pt declines order today.  Lung Cancer Screening: pt declines  Additional Screening:  Vision Screening: Recommended annual ophthalmology exams for early detection of glaucoma and other disorders of the  eye. Is the patient up to date with their annual eye exam?  Yes  Who is the provider or what is the name of the office in which the patient attends annual eye exams? Dr.Lindsay    Dental Screening: Recommended annual dental exams for proper oral hygiene  Community Resource Referral / Chronic Care Management: CRR required this visit?  No   CCM required this visit?  No      Plan:    Please schedule your next medicare wellness visit with me in 1 yr.  Continue to eat heart healthy diet (full of fruits, vegetables, whole grains, lean protein, water--limit salt, fat, and sugar intake) and increase physical activity as tolerated.  Continue doing brain stimulating activities (puzzles, reading, adult coloring books, staying active) to keep memory sharp.   Bring a copy of your living will and/or healthcare power of attorney to your next office visit.   I have personally reviewed and noted the following in the patient's chart:   . Medical and social history . Use of alcohol, tobacco or illicit drugs  . Current medications and supplements . Functional ability and status . Nutritional status . Physical activity . Advanced directives . List of other physicians . Hospitalizations, surgeries, and ER visits in previous 12 months . Vitals . Screenings to include cognitive, depression, and falls . Referrals and appointments  In addition, I have reviewed and discussed with patient certain preventive protocols, quality metrics, and best practice recommendations. A written personalized care plan for preventive services as well as general preventive health recommendations were provided to patient.   Due to  this being a telephonic visit, the after visit summary with patients personalized plan was offered to patient via mail or my-chart. Patient would like to access on my-chart.  Shela Nevin, [place]   08/24/2019   Nurse Notes: plays poker with friends once/month

## 2019-08-24 ENCOUNTER — Other Ambulatory Visit: Payer: Self-pay

## 2019-08-24 ENCOUNTER — Encounter: Payer: Self-pay | Admitting: *Deleted

## 2019-08-24 ENCOUNTER — Ambulatory Visit: Payer: Medicare PPO | Admitting: *Deleted

## 2019-08-24 VITALS — BP 140/83 | HR 83 | Wt 151.0 lb

## 2019-08-24 DIAGNOSIS — Z0000 Encounter for general adult medical examination without abnormal findings: Secondary | ICD-10-CM

## 2019-08-24 NOTE — Patient Instructions (Addendum)
Please schedule your next medicare wellness visit with me in 1 yr.   [name] , Thank you for taking time to come for your Medicare Wellness Visit. I appreciate your ongoing commitment to your health goals. Please review the following plan we discussed and let me know if I can assist you in the future.   Screening recommendations/referrals: Colorectal cancer screening: Completed 06/16/19. Repeat every 3 years Mammogram status: Completed 07/22/19. Repeat every year Bone Density status: Completed 10/16/16. Results reflect: Bone density results: NORMAL. Repeat every 2 years. pt declines order today. Recommended yearly ophthalmology/optometry visit for glaucoma screening and checkup Recommended yearly dental visit for hygiene and checkup  Vaccinations: TDAP status: Up to date Flu Vaccine status: Declined, Education has been provided regarding the importance of this vaccine but patient still declined. Advised may receive this vaccine at local pharmacy or Health Dept. Aware to provide a copy of the vaccination record if obtained from local pharmacy or Health Dept. Verbalized acceptance and understanding. Pneumococcal vaccine status: Up to date Covid-19 vaccine status: Completed vaccines  Qualifies for Shingles Vaccine?   Zostavax completed Yes   Shingrix Completed?: No.    Education has been provided regarding the importance of this vaccine. Patient has been advised to call insurance company to determine out of pocket expense if they have not yet received this vaccine. Advised may also receive vaccine at local pharmacy or Health Dept. Verbalized acceptance and understanding.  Bring a copy of your living will and/or healthcare power of attorney to your next office visit.   Preventive Care 72 Years and Older, Female Preventive care refers to lifestyle choices and visits with your health care provider that can promote health and wellness. What does preventive care include?  A yearly physical  exam. This is also called an annual well check.  Dental exams once or twice a year.  Routine eye exams. Ask your health care provider how often you should have your eyes checked.  Personal lifestyle choices, including:  Daily care of your teeth and gums.  Regular physical activity.  Eating a healthy diet.  Avoiding tobacco and drug use.  Limiting alcohol use.  Practicing safe sex.  Taking low-dose aspirin every day.  Taking vitamin and mineral supplements as recommended by your health care provider. What happens during an annual well check? The services and screenings done by your health care provider during your annual well check will depend on your age, overall health, lifestyle risk factors, and family history of disease. Counseling  Your health care provider may ask you questions about your:  Alcohol use.  Tobacco use.  Drug use.  Emotional well-being.  Home and relationship well-being.  Sexual activity.  Eating habits.  History of falls.  Memory and ability to understand (cognition).  Work and work Statistician.  Reproductive health. Screening  You may have the following tests or measurements:  Height, weight, and BMI.  Blood pressure.  Lipid and cholesterol levels. These may be checked every 5 years, or more frequently if you are over 72 years old.  Skin check.  Lung cancer screening. You may have this screening every year starting at age 72 if you have a 30-pack-year history of smoking and currently smoke or have quit within the past 15 years.  Fecal occult blood test (FOBT) of the stool. You may have this test every year starting at age 72.  Flexible sigmoidoscopy or colonoscopy. You may have a sigmoidoscopy every 5 years or a colonoscopy every 10 years starting at  age 72.  Hepatitis C blood test.  Hepatitis B blood test.  Sexually transmitted disease (STD) testing.  Diabetes screening. This is done by checking your blood sugar (glucose)  after you have not eaten for a while (fasting). You may have this done every 1-3 years.  Bone density scan. This is done to screen for osteoporosis. You may have this done starting at age 72.  Mammogram. This may be done every 1-2 years. Talk to your health care provider about how often you should have regular mammograms. Talk with your health care provider about your test results, treatment options, and if necessary, the need for more tests. Vaccines  Your health care provider may recommend certain vaccines, such as:  Influenza vaccine. This is recommended every year.  Tetanus, diphtheria, and acellular pertussis (Tdap, Td) vaccine. You may need a Td booster every 10 years.  Zoster vaccine. You may need this after age 72.  Pneumococcal 13-valent conjugate (PCV13) vaccine. One dose is recommended after age 72.  Pneumococcal polysaccharide (PPSV23) vaccine. One dose is recommended after age 5. Talk to your health care provider about which screenings and vaccines you need and how often you need them. This information is not intended to replace advice given to you by your health care provider. Make sure you discuss any questions you have with your health care provider. Document Released: 03/17/2015 Document Revised: 11/08/2015 Document Reviewed: 12/20/2014 Elsevier Interactive Patient Education  2017 [place] Prevention in the Home Falls can cause injuries. They can happen to people of all ages. There are many things you can do to make your home safe and to help prevent falls. What can I do on the outside of my home?  Regularly fix the edges of walkways and driveways and fix any cracks.  Remove anything that might make you trip as you walk through a door, such as a raised step or threshold.  Trim any bushes or trees on the path to your home.  Use bright outdoor lighting.  Clear any walking paths of anything that might make someone trip, such as rocks or  tools.  Regularly check to see if handrails are loose or broken. Make sure that both sides of any steps have handrails.  Any raised decks and porches should have guardrails on the edges.  Have any leaves, snow, or ice cleared regularly.  Use sand or salt on walking paths during winter.  Clean up any spills in your garage right away. This includes oil or grease spills. What can I do in the bathroom?  Use night lights.  Install grab bars by the toilet and in the tub and shower. Do not use towel bars as grab bars.  Use non-skid mats or decals in the tub or shower.  If you need to sit down in the shower, use a plastic, non-slip stool.  Keep the floor dry. Clean up any water that spills on the floor as soon as it happens.  Remove soap buildup in the tub or shower regularly.  Attach bath mats securely with double-sided non-slip rug tape.  Do not have throw rugs and other things on the floor that can make you trip. What can I do in the bedroom?  Use night lights.  Make sure that you have a light by your bed that is easy to reach.  Do not use any sheets or blankets that are too big for your bed. They should not hang down onto the floor.  Have a firm chair  that has side arms. You can use this for support while you get dressed.  Do not have throw rugs and other things on the floor that can make you trip. What can I do in the kitchen?  Clean up any spills right away.  Avoid walking on wet floors.  Keep items that you use a lot in easy-to-reach places.  If you need to reach something above you, use a strong step stool that has a grab bar.  Keep electrical cords out of the way.  Do not use floor polish or wax that makes floors slippery. If you must use wax, use non-skid floor wax.  Do not have throw rugs and other things on the floor that can make you trip. What can I do with my stairs?  Do not leave any items on the stairs.  Make sure that there are handrails on both  sides of the stairs and use them. Fix handrails that are broken or loose. Make sure that handrails are as long as the stairways.  Check any carpeting to make sure that it is firmly attached to the stairs. Fix any carpet that is loose or worn.  Avoid having throw rugs at the top or bottom of the stairs. If you do have throw rugs, attach them to the floor with carpet tape.  Make sure that you have a light switch at the top of the stairs and the bottom of the stairs. If you do not have them, ask someone to add them for you. What else can I do to help prevent falls?  Wear shoes that:  Do not have high heels.  Have rubber bottoms.  Are comfortable and fit you well.  Are closed at the toe. Do not wear sandals.  If you use a stepladder:  Make sure that it is fully opened. Do not climb a closed stepladder.  Make sure that both sides of the stepladder are locked into place.  Ask someone to hold it for you, if possible.  Clearly mark and make sure that you can see:  Any grab bars or handrails.  First and last steps.  Where the edge of each step is.  Use tools that help you move around (mobility aids) if they are needed. These include:  Canes.  Walkers.  Scooters.  Crutches.  Turn on the lights when you go into a dark area. Replace any light bulbs as soon as they burn out.  Set up your furniture so you have a clear path. Avoid moving your furniture around.  If any of your floors are uneven, fix them.  If there are any pets around you, be aware of where they are.  Review your medicines with your doctor. Some medicines can make you feel dizzy. This can increase your chance of falling. Ask your doctor what other things that you can do to help prevent falls. This information is not intended to replace advice given to you by your health care provider. Make sure you discuss any questions you have with your health care provider. Document Released: 12/15/2008 Document Revised:  07/27/2015 Document Reviewed: 03/25/2014 Elsevier Interactive Patient Education  2017 [employer].

## 2019-09-01 ENCOUNTER — Encounter: Payer: Medicare Other | Admitting: Nurse Practitioner

## 2019-09-14 ENCOUNTER — Encounter: Payer: Medicare PPO | Admitting: Nurse Practitioner

## 2019-10-01 ENCOUNTER — Other Ambulatory Visit: Payer: Self-pay | Admitting: Family

## 2019-10-18 ENCOUNTER — Other Ambulatory Visit: Payer: Self-pay

## 2019-10-18 DIAGNOSIS — Z79890 Hormone replacement therapy (postmenopausal): Secondary | ICD-10-CM

## 2019-10-18 MED ORDER — ESTRADIOL 0.5 MG PO TABS: ORAL_TABLET | ORAL | 0 refills | Status: DC

## 2019-10-18 NOTE — Telephone Encounter (Signed)
CE scheduled 10/20/19 with TW.

## 2019-10-20 ENCOUNTER — Other Ambulatory Visit: Payer: Self-pay

## 2019-10-20 ENCOUNTER — Encounter: Payer: Self-pay | Admitting: Nurse Practitioner

## 2019-10-20 ENCOUNTER — Ambulatory Visit: Payer: Medicare PPO | Admitting: Nurse Practitioner

## 2019-10-20 VITALS — BP 118/76 | Ht 63.0 in | Wt 150.0 lb

## 2019-10-20 DIAGNOSIS — Z90710 Acquired absence of both cervix and uterus: Secondary | ICD-10-CM

## 2019-10-20 DIAGNOSIS — Z79890 Hormone replacement therapy (postmenopausal): Secondary | ICD-10-CM

## 2019-10-20 DIAGNOSIS — Z9079 Acquired absence of other genital organ(s): Secondary | ICD-10-CM

## 2019-10-20 DIAGNOSIS — Z01419 Encounter for gynecological examination (general) (routine) without abnormal findings: Secondary | ICD-10-CM | POA: Diagnosis not present

## 2019-10-20 MED ORDER — ESTRADIOL 0.5 MG PO TABS: ORAL_TABLET | ORAL | 4 refills | Status: DC

## 2019-10-20 NOTE — Progress Notes (Signed)
   [name] 23-Nov-1945 [id number]   History:  74 y.o. G1P2001 presents for breast and pelvic exam without GYN complaints. 2004 TAH BSO for fibroids, Estrace 0.25 mg daily for management of hot flashes. She has tried to wean in the past but was unable to tolerated. Normal pap and mammogram history. Primary care manages HTN, HLD, and bone density screenings.   Gynecologic History Patient's last menstrual period was 03/04/2002.   Last Pap: no longer screening per guidelines Last mammogram: 07/22/2019. Results were: normal Last colonoscopy: 06/16/2019. Results were: polyp, 3 year follow up recommended Last Dexa: 10/16/2016. Results were: t-score +2.5 at spine  Past medical history, past surgical history, family history and social history were all reviewed and documented in the EPIC chart.  ROS:  A ROS was performed and pertinent positives and negatives are included.  Exam:  Vitals:   10/20/19 1030  BP: 118/76  Weight: 150 lb (68 kg)  Height: 5\' 3"  (1.6 m)   Body mass index is 26.57 kg/m.  General appearance:  Normal Thyroid:  Symmetrical, normal in size, without palpable masses or nodularity. Respiratory  Auscultation:  Clear without wheezing or rhonchi Cardiovascular  Auscultation:  Regular rate, without rubs, murmurs or gallops  Edema/varicosities:  Not grossly evident Abdominal  Soft,nontender, without masses, guarding or rebound.  Liver/spleen:  No organomegaly noted  Hernia:  None appreciated  Skin  Inspection:  Grossly normal   Breasts: Examined lying and sitting.   Right: Without masses, retractions, discharge or axillary adenopathy.   Left: Without masses, retractions, discharge or axillary adenopathy. Gentitourinary   Inguinal/mons:  Normal without inguinal adenopathy  External genitalia:  Normal  BUS/Urethra/Skene's glands:  Normal  Vagina:  Normal  Cervix:  Absent  Uterus:  Absent  Adnexa/parametria:     Rt: Without masses or tenderness.   Lt: Without  masses or tenderness.  Anus and perineum: hemorrhoids  Digital rectal exam: Normal sphincter tone without palpated masses or tenderness  Assessment/Plan:  74 y.o. G1P1001 for breast and pelvic exam.   Well female exam with routine gynecological exam - Education provided on SBEs, importance of preventative screenings, current guidelines, high calcium diet, regular exercise, and multivitamin daily.   History of total abdominal hysterectomy and bilateral salpingo-oophorectomy - 2004 for fibroids. HRT for management of hot flashes.   Hormone replacement therapy (HRT) - Plan: estradiol (ESTRACE) 0.5 MG tablet. Takes 1/2 tablet daily for hot flashes. Aware of risks for blood clots, stroke, heart attack, and breast cancer. She would like to continue. Refill x 1 year provided.  Follow up in 1 year for annual     [place], 11:00 AM 10/20/2019

## 2019-10-20 NOTE — Patient Instructions (Signed)
Health Maintenance After Age 72 After age 72, you are at a higher risk for certain long-term diseases and infections as well as injuries from falls. Falls are a major cause of broken bones and head injuries in people who are older than age 72. Getting regular preventive care can help to keep you healthy and well. Preventive care includes getting regular testing and making lifestyle changes as recommended by your health care provider. Talk with your health care provider about:  Which screenings and tests you should have. A screening is a test that checks for a disease when you have no symptoms.  A diet and exercise plan that is right for you. What should I know about screenings and tests to prevent falls? Screening and testing are the best ways to find a health problem early. Early diagnosis and treatment give you the best chance of managing medical conditions that are common after age 72. Certain conditions and lifestyle choices may make you more likely to have a fall. Your health care provider may recommend:  Regular vision checks. Poor vision and conditions such as cataracts can make you more likely to have a fall. If you wear glasses, make sure to get your prescription updated if your vision changes.  Medicine review. Work with your health care provider to regularly review all of the medicines you are taking, including over-the-counter medicines. Ask your health care provider about any side effects that may make you more likely to have a fall. Tell your health care provider if any medicines that you take make you feel dizzy or sleepy.  Osteoporosis screening. Osteoporosis is a condition that causes the bones to get weaker. This can make the bones weak and cause them to break more easily.  Blood pressure screening. Blood pressure changes and medicines to control blood pressure can make you feel dizzy.  Strength and balance checks. Your health care provider may recommend certain tests to check your  strength and balance while standing, walking, or changing positions.  Foot health exam. Foot pain and numbness, as well as not wearing proper footwear, can make you more likely to have a fall.  Depression screening. You may be more likely to have a fall if you have a fear of falling, feel emotionally low, or feel unable to do activities that you used to do.  Alcohol use screening. Using too much alcohol can affect your balance and may make you more likely to have a fall. What actions can I take to lower my risk of falls? General instructions  Talk with your health care provider about your risks for falling. Tell your health care provider if: ? You fall. Be sure to tell your health care provider about all falls, even ones that seem minor. ? You feel dizzy, sleepy, or off-balance.  Take over-the-counter and prescription medicines only as told by your health care provider. These include any supplements.  Eat a healthy diet and maintain a healthy weight. A healthy diet includes low-fat dairy products, low-fat (lean) meats, and fiber from whole grains, beans, and lots of fruits and vegetables. Home safety  Remove any tripping hazards, such as rugs, cords, and clutter.  Install safety equipment such as grab bars in bathrooms and safety rails on stairs.  Keep rooms and walkways well-lit. Activity   Follow a regular exercise program to stay fit. This will help you maintain your balance. Ask your health care provider what types of exercise are appropriate for you.  If you need a cane or   walker, use it as recommended by your health care provider.  Wear supportive shoes that have nonskid soles. Lifestyle  Do not drink alcohol if your health care provider tells you not to drink.  If you drink alcohol, limit how much you have: ? 0-1 drink a day for women. ? 0-2 drinks a day for men.  Be aware of how much alcohol is in your drink. In the [place] one drink equals one typical bottle of beer (12  oz), one-half glass of wine (5 oz), or one shot of hard liquor (1 oz).  Do not use any products that contain nicotine or tobacco, such as cigarettes and e-cigarettes. If you need help quitting, ask your health care provider. Summary  Having a healthy lifestyle and getting preventive care can help to protect your health and wellness after age 72.  Screening and testing are the best way to find a health problem early and help you avoid having a fall. Early diagnosis and treatment give you the best chance for managing medical conditions that are more common for people who are older than age 72.  Falls are a major cause of broken bones and head injuries in people who are older than age 72. Take precautions to prevent a fall at home.  Work with your health care provider to learn what changes you can make to improve your health and wellness and to prevent falls. This information is not intended to replace advice given to you by your health care provider. Make sure you discuss any questions you have with your health care provider. Document Revised: 06/11/2018 Document Reviewed: 01/01/2017 Elsevier Patient Education  2020 Elsevier Inc.  

## 2019-12-22 ENCOUNTER — Encounter: Payer: Self-pay | Admitting: Family

## 2019-12-22 ENCOUNTER — Other Ambulatory Visit: Payer: Self-pay

## 2019-12-22 ENCOUNTER — Ambulatory Visit: Payer: Medicare PPO | Admitting: Family

## 2019-12-22 VITALS — BP 138/90 | HR 73 | Temp 98.1°F | Resp 16 | Ht 63.5 in | Wt 147.0 lb

## 2019-12-22 DIAGNOSIS — E785 Hyperlipidemia, unspecified: Secondary | ICD-10-CM | POA: Diagnosis not present

## 2019-12-22 DIAGNOSIS — I10 Essential (primary) hypertension: Secondary | ICD-10-CM

## 2019-12-22 MED ORDER — TELMISARTAN-HCTZ 80-25 MG PO TABS: 1.0000 | ORAL_TABLET | Freq: Every day | ORAL | 1 refills | Status: DC

## 2019-12-22 MED ORDER — METOPROLOL SUCCINATE ER 100 MG PO TB24: 100.0000 mg | ORAL_TABLET | Freq: Every day | ORAL | 1 refills | Status: DC

## 2019-12-22 NOTE — Progress Notes (Signed)
Subjective:    Patient ID: [name], female    DOB: 01/26/1946, 74 y.o.   MRN: [id number]  HPI  Patient is a 74 yr old female who presents today for follow up.  HTN- maintained on toprol-xl 100mg  once daily BP Readings from Last 3 Encounters:  12/22/19 138/90  10/20/19 118/76  08/24/19 140/83   Hyperlipidemia-  On diet only.  Lab Results  Component Value Date   CHOL 192 11/20/2018   HDL 76.20 11/20/2018   LDLDIRECT 86.0 11/20/2018   TRIG 243.0 (H) 11/20/2018   CHOLHDL 3 11/20/2018     Review of Systems See HPI  Past Medical History:  Diagnosis Date  . Cervical dysplasia   . Endometrial hyperplasia   . Fibroid    Atypical leiomyoma  . History of colon polyps   . Hyperglycemia 05/23/2014  . Hyperglycemia   . Hypertension      Social History   Socioeconomic History  . Marital status: Married    Spouse name: Not on file  . Number of children: Not on file  . Years of education: Not on file  . Highest education level: Not on file  Occupational History  . Not on file  Tobacco Use  . Smoking status: Former Smoker    Packs/day: 0.25    Years: 30.00    Pack years: 7.50    Types: Cigarettes    Quit date: 2013    Years since quitting: 8.8  . Smokeless tobacco: Never Used  Vaping Use  . Vaping Use: Never used  Substance and Sexual Activity  . Alcohol use: Yes    Alcohol/week: 0.0 standard drinks    Comment: 2-4 glasses of wine on weekends  . Drug use: No  . Sexual activity: Yes    Birth control/protection: Surgical    Comment: 1st intercourse 74 yo-fewer than 5 partners  Other Topics Concern  . 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   Enjoys reading, yard work, cares for her mother during the day, her brother helps during then night, enjoys poker   1 daughter (in [place]) and 2 grand-daughters  (In [place]- age 66 and 15)   Social Determinants of Health   Financial Resource Strain: [place]   . Difficulty of Paying Living Expenses: Not hard at all  Food Insecurity: No Food Insecurity  . Worried About Charity fundraiser in the Last Year: Never true  . Ran Out of Food in the Last Year: Never true  Transportation Needs: No Transportation Needs  . Lack of Transportation (Medical): No  . Lack of Transportation (Non-Medical): No  Physical Activity:   . Days of Exercise per Week: Not on file  . Minutes of Exercise per Session: Not on file  Stress:   . Feeling of Stress : Not on file  Social Connections:   . Frequency of Communication with Friends and Family: Not on file  . Frequency of Social Gatherings with Friends and Family: Not on file  . Attends Religious Services: Not on file  . Active Member of Clubs or Organizations: Not on file  . Attends Archivist Meetings: Not on file  . Marital Status: Not on file  Intimate Partner Violence:   . Fear of Current or Ex-Partner: Not on file  .  Emotionally Abused: Not on file  . Physically Abused: Not on file  . Sexually Abused: Not on file    Past Surgical History:  Procedure Laterality Date  . ABDOMINAL HYSTERECTOMY  2004   TAH_BSO  . BREAST BIOPSY Left 2000  . BREAST BIOPSY Bilateral 1999  . BREAST EXCISIONAL BIOPSY Left    benign  . BREAST SURGERY  1998   Benign left breast lump  . COLPOSCOPY    . EXCISION MASS LOWER EXTREMETIES Right 05/30/2016   Procedure: EXCISION RIGHT THIGH MASS;  Surgeon: Erroll Luna, MD;  Location: [facility];  Service: General;  Laterality: Right;  EXCISION RIGHT THIGH MASS  . HEMORRHOID SURGERY  1980's  . LYMPH NODE BIOPSY Left 1975   ? "removed lymph nodes in left leg"--benign per pt  . OOPHORECTOMY     BSO  . TONSILLECTOMY  1990    Family History  Problem Relation Age of Onset  . Hypertension Mother   . Heart disease Mother   . Diabetes Mother   . Breast cancer Mother 22  . Heart  disease Father   . Cancer Father        prostate  . Cancer Sister 10       Leukemia, deceased  . Breast cancer Maternal Aunt        over 60  . Breast cancer Maternal Grandmother        over 22  . Hypertension Brother   . Diabetes Brother   . Stroke Brother     Allergies  Allergen Reactions  . Atorvastatin     Muscle cramps  . Fenofibrate     "Locked her muscles up"  . Hydralazine     "kidney pain"  . Potassium-Containing Compounds Other (See Comments)    Burning  sensation in kidney  area  . Latex Rash    Or just redness     Current Outpatient Medications on File Prior to Visit  Medication Sig Dispense Refill  . acyclovir (ZOVIRAX) 200 MG capsule Take 1 capsule (200 mg total) by mouth 5 (five) times daily. 30 capsule 6  . Calcium Carbonate-Vitamin D (CALCIUM + D PO) Take 2 tablets by mouth daily.     Marland Kitchen estradiol (ESTRACE) 0.5 MG tablet Take 1/2 tab po daily. 45 tablet 4  . KRILL OIL PO Take 1 tablet by mouth daily.    . metoprolol succinate (TOPROL-XL) 100 MG 24 hr tablet Take 1 tablet (100 mg total) by mouth daily. Take with or immediately following a meal. 90 tablet 0  . Multiple Vitamin (MULTIVITAMIN) tablet Take 1 tablet by mouth daily.      Marland Kitchen telmisartan-hydrochlorothiazide (MICARDIS HCT) 80-25 MG tablet Take 1 tablet by mouth daily. 90 tablet 0  . vitamin C (ASCORBIC ACID) 500 MG tablet Take 500 mg by mouth daily.     No current facility-administered medications on file prior to visit.    BP 138/90 (BP Location: Right Arm, Patient Position: Sitting, Cuff Size: Small)   Pulse 73   Temp 98.1 F (36.7 C) (Oral)   Resp 16   Ht 5' 3.5" (1.613 m)   Wt 147 lb (66.7 kg)   LMP 03/04/2002   SpO2 100%   BMI 25.63 kg/m       Objective:   Physical Exam Constitutional:      Appearance: She is well-developed.  Cardiovascular:     Rate and Rhythm: Normal rate and regular rhythm.     Heart sounds: Normal heart sounds. No  murmur heard.   Pulmonary:     Effort:  Pulmonary effort is normal. No respiratory distress.     Breath sounds: Normal breath sounds. No wheezing.  Psychiatric:        Behavior: Behavior normal.        Thought Content: Thought content normal.        Judgment: Judgment normal.           Assessment & Plan:  HTN- bp stable. Continue toprol xl 100mg  once daily. Obtain follow up bmet.  Hyperlipidemia- LDL at goal. Continue low fat/low cholesterol diet.  This visit occurred during the SARS-CoV-2 public health emergency.  Safety protocols were in place, including screening questions prior to the visit, additional usage of staff PPE, and extensive cleaning of exam room while observing appropriate contact time as indicated for disinfecting solutions.

## 2019-12-23 ENCOUNTER — Telehealth: Payer: Self-pay | Admitting: Family

## 2019-12-23 LAB — BASIC METABOLIC PANEL
BUN/Creatinine Ratio: 15 (calc) (ref 6–22)
BUN: 20 mg/dL (ref 7–25)
CO2: 27 mmol/L (ref 20–32)
Calcium: 9.1 mg/dL (ref 8.6–10.4)
Chloride: 98 mmol/L (ref 98–110)
Creat: 1.32 mg/dL — ABNORMAL HIGH (ref 0.60–0.93)
Glucose, Bld: 157 mg/dL — ABNORMAL HIGH (ref 65–99)
Potassium: 3.6 mmol/L (ref 3.5–5.3)
Sodium: 138 mmol/L (ref 135–146)

## 2019-12-23 NOTE — Telephone Encounter (Signed)
See mychart.  

## 2019-12-30 ENCOUNTER — Other Ambulatory Visit: Payer: Self-pay

## 2020-05-17 ENCOUNTER — Ambulatory Visit: Payer: Medicare PPO | Admitting: Family

## 2020-05-17 ENCOUNTER — Encounter: Payer: Self-pay | Admitting: Family

## 2020-05-17 ENCOUNTER — Other Ambulatory Visit: Payer: Self-pay

## 2020-05-17 VITALS — BP 127/77 | HR 84 | Temp 97.6°F | Resp 16 | Ht 63.0 in | Wt 150.0 lb

## 2020-05-17 DIAGNOSIS — N3000 Acute cystitis without hematuria: Secondary | ICD-10-CM

## 2020-05-17 LAB — URINALYSIS, ROUTINE W REFLEX MICROSCOPIC
Hgb urine dipstick: NEGATIVE
Ketones, ur: NEGATIVE
Nitrite: NEGATIVE
Specific Gravity, Urine: 1.02 (ref 1.000–1.030)
Total Protein, Urine: 30 — AB
Urine Glucose: NEGATIVE
Urobilinogen, UA: 1 (ref 0.0–1.0)
pH: 6 (ref 5.0–8.0)

## 2020-05-17 MED ORDER — CEPHALEXIN 500 MG PO CAPS: 500.0000 mg | ORAL_CAPSULE | Freq: Three times a day (TID) | ORAL | 0 refills | Status: AC

## 2020-05-17 NOTE — Patient Instructions (Signed)
Please begin Keflex for UTI. Call if symptoms worsen or if not improved in 2-3 days.   Urinary Tract Infection, Adult A urinary tract infection (UTI) is an infection of any part of the urinary tract. The urinary tract includes:  The kidneys.  The ureters.  The bladder.  The urethra. These organs make, store, and get rid of pee (urine) in the body. What are the causes? This infection is caused by germs (bacteria) in your genital area. These germs grow and cause swelling (inflammation) of your urinary tract. What increases the risk? The following factors may make you more likely to develop this condition:  Using a small, thin tube (catheter) to drain pee.  Not being able to control when you pee or poop (incontinence).  Being female. If you are female, these things can increase the risk: ? Using these methods to prevent pregnancy:  A medicine that kills sperm (spermicide).  A device that blocks sperm (diaphragm). ? Having low levels of a female hormone (estrogen). ? Being pregnant. You are more likely to develop this condition if:  You have genes that add to your risk.  You are sexually active.  You take antibiotic medicines.  You have trouble peeing because of: ? A prostate that is bigger than normal, if you are female. ? A blockage in the part of your body that drains pee from the bladder. ? A kidney stone. ? A nerve condition that affects your bladder. ? Not getting enough to drink. ? Not peeing often enough.  You have other conditions, such as: ? Diabetes. ? A weak disease-fighting system (immune system). ? Sickle cell disease. ? Gout. ? Injury of the spine. What are the signs or symptoms? Symptoms of this condition include:  Needing to pee right away.  Peeing small amounts often.  Pain or burning when peeing.  Blood in the pee.  Pee that smells bad or not like normal.  Trouble peeing.  Pee that is cloudy.  Fluid coming from the vagina, if you are  female.  Pain in the belly or lower back. Other symptoms include:  Vomiting.  Not feeling hungry.  Feeling mixed up (confused). This may be the first symptom in older adults.  Being tired and grouchy (irritable).  A fever.  Watery poop (diarrhea). How is this treated?  Taking antibiotic medicine.  Taking other medicines.  Drinking enough water. In some cases, you may need to see a specialist. Follow these instructions at home: Medicines  Take over-the-counter and prescription medicines only as told by your doctor.  If you were prescribed an antibiotic medicine, take it as told by your doctor. Do not stop taking it even if you start to feel better. General instructions  Make sure you: ? Pee until your bladder is empty. ? Do not hold pee for a long time. ? Empty your bladder after sex. ? Wipe from front to back after peeing or pooping if you are a female. Use each tissue one time when you wipe.  Drink enough fluid to keep your pee pale yellow.  Keep all follow-up visits.   Contact a doctor if:  You do not get better after 1-2 days.  Your symptoms go away and then come back. Get help right away if:  You have very bad back pain.  You have very bad pain in your lower belly.  You have a fever.  You have chills.  You feeling like you will vomit or you vomit. Summary  A urinary tract  infection (UTI) is an infection of any part of the urinary tract.  This condition is caused by germs in your genital area.  There are many risk factors for a UTI.  Treatment includes antibiotic medicines.  Drink enough fluid to keep your pee pale yellow. This information is not intended to replace advice given to you by your health care provider. Make sure you discuss any questions you have with your health care provider. Document Revised: 10/01/2019 Document Reviewed: 10/01/2019 Elsevier Patient Education  [employer].

## 2020-05-17 NOTE — Progress Notes (Signed)
Subjective:    Patient ID: [name], female    DOB: 1945-03-24, 75 y.o.   MRN: [id number]  HPI The patient presents today for an office visit complaining of having urinary hesitancy and "smelly" urine x 2 weeks. She endorses having lower back and kidney pain. She denies any urinary burning or frequency. She denies any chest pain, SOB, fever, abdominal pain, cough, chills, sore throat, urinary incontinence, back pain, HA, or N/VD at this time.    Review of Systems  Constitutional: Negative for chills, fatigue and fever.  HENT: Negative for ear pain, sinus pain and sore throat.   Respiratory: Negative for cough and shortness of breath.   Cardiovascular: Negative for chest pain and palpitations.  Gastrointestinal: Negative for abdominal pain, diarrhea, nausea and vomiting.  Genitourinary: Positive for decreased urine volume. Negative for flank pain.       (+)Change in urine smell   Musculoskeletal: Negative for back pain and neck pain.  Skin: Negative for rash.   Past Medical History:  Diagnosis Date  . Cervical dysplasia   . Endometrial hyperplasia   . Fibroid    Atypical leiomyoma  . History of colon polyps   . Hyperglycemia 05/23/2014  . Hyperglycemia   . Hypertension      Social History   Socioeconomic History  . Marital status: Married    Spouse name: Not on file  . Number of children: Not on file  . Years of education: Not on file  . Highest education level: Not on file  Occupational History  . Not on file  Tobacco Use  . Smoking status: Former Smoker    Packs/day: 0.25    Years: 30.00    Pack years: 7.50    Types: Cigarettes    Quit date: 2013    Years since quitting: 9.2  . Smokeless tobacco: Never Used  Vaping Use  . Vaping Use: Never used  Substance and Sexual Activity  . Alcohol use: Yes    Alcohol/week: 0.0 standard drinks    Comment: 2-4 glasses of wine on weekends  . Drug use: No  . Sexual activity: Yes    Birth control/protection:  Surgical    Comment: 1st intercourse 75 yo-fewer than 5 partners  Other Topics Concern  . 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   Enjoys reading, yard work, cares for her mother during the day, her brother helps during then night, enjoys poker   1 daughter (in [place]) and 2 grand-daughters (In [place]- age 47 and 34)   Social Determinants of Health   Financial Resource Strain: [place]   . Difficulty of Paying Living Expenses: Not hard at all  Food Insecurity: No Food Insecurity  . Worried About Charity fundraiser in the Last Year: Never true  . Ran Out of Food in the Last Year: Never true  Transportation Needs: No Transportation Needs  . Lack of Transportation (Medical): No  . Lack of Transportation (Non-Medical): No  Physical Activity: Not on file  Stress: Not on file  Social Connections: Not on file  Intimate Partner Violence: Not on file    Past Surgical History:  Procedure Laterality Date  . ABDOMINAL HYSTERECTOMY  2004   TAH_BSO  . BREAST BIOPSY Left 2000  . BREAST BIOPSY Bilateral 1999  .  BREAST EXCISIONAL BIOPSY Left    benign  . BREAST SURGERY  1998   Benign left breast lump  . COLPOSCOPY    . EXCISION MASS LOWER EXTREMETIES Right 05/30/2016   Procedure: EXCISION RIGHT THIGH MASS;  Surgeon: Erroll Luna, MD;  Location: [facility];  Service: General;  Laterality: Right;  EXCISION RIGHT THIGH MASS  . HEMORRHOID SURGERY  1980's  . LYMPH NODE BIOPSY Left 1975   ? "removed lymph nodes in left leg"--benign per pt  . OOPHORECTOMY     BSO  . TONSILLECTOMY  1990    Family History  Problem Relation Age of Onset  . Hypertension Mother   . Heart disease Mother   . Diabetes Mother   . Breast cancer Mother 18  . Heart disease Father   . Cancer Father        prostate  . Cancer Sister 25       Leukemia, deceased  .  Breast cancer Maternal Aunt        over 52  . Breast cancer Maternal Grandmother        over 82  . Hypertension Brother   . Diabetes Brother   . Stroke Brother     Allergies  Allergen Reactions  . Atorvastatin     Muscle cramps  . Fenofibrate     "Locked her muscles up"  . Hydralazine     "kidney pain"  . Potassium-Containing Compounds Other (See Comments)    Burning  sensation in kidney  area  . Latex Rash    Or just redness     Current Outpatient Medications on File Prior to Visit  Medication Sig Dispense Refill  . acyclovir (ZOVIRAX) 200 MG capsule Take 1 capsule (200 mg total) by mouth 5 (five) times daily. 30 capsule 6  . Calcium Carbonate-Vitamin D (CALCIUM + D PO) Take 2 tablets by mouth daily.     Marland Kitchen estradiol (ESTRACE) 0.5 MG tablet Take 1/2 tab po daily. 45 tablet 4  . KRILL OIL PO Take 1 tablet by mouth daily.    . metoprolol succinate (TOPROL-XL) 100 MG 24 hr tablet Take 1 tablet (100 mg total) by mouth daily. Take with or immediately following a meal. 90 tablet 1  . Multiple Vitamin (MULTIVITAMIN) tablet Take 1 tablet by mouth daily.    Marland Kitchen telmisartan-hydrochlorothiazide (MICARDIS HCT) 80-25 MG tablet Take 1 tablet by mouth daily. 90 tablet 1  . vitamin C (ASCORBIC ACID) 500 MG tablet Take 500 mg by mouth daily.     No current facility-administered medications on file prior to visit.    BP 127/77 (BP Location: Right Arm, Patient Position: Sitting, Cuff Size: Small)   Pulse 84   Temp 97.6 F (36.4 C) (Oral)   Resp 16   Ht 5\' 3"  (1.6 m)   Wt 150 lb (68 kg)   LMP 03/04/2002   SpO2 99%   BMI 26.57 kg/m       Objective:   Physical Exam Constitutional:      General: She is not in acute distress.    Appearance: Normal appearance. She is not ill-appearing.  HENT:     Right Ear: External ear normal.     Left Ear: External ear normal.  Cardiovascular:     Rate and Rhythm: Normal rate and regular rhythm.     Pulses: Normal pulses.     Heart sounds:  Normal heart sounds. No murmur heard.   Pulmonary:     Effort: Pulmonary effort is  normal. No respiratory distress.     Breath sounds: Normal breath sounds. No wheezing or rales.  Abdominal:     General: Bowel sounds are normal.     Palpations: Abdomen is soft.     Tenderness: There is no abdominal tenderness. There is no right CVA tenderness or left CVA tenderness.  Musculoskeletal:     Cervical back: Normal range of motion.  Neurological:     Mental Status: She is alert and oriented to person, place, and time.  Psychiatric:        Behavior: Behavior normal.        Assessment & Plan:  UTI  Dip + for luekocytes Will treat with Cephalexin 500 mg x 3 daily Urine to be sent for culture POC dip shows protein- suspect false positive. Will repeat UA in the lab.  I,Alexis Bryant,acting as a Education administrator for [employer], NP.,have documented all relevant documentation on the behalf of Nance Pear, NP,as directed by  Nance Pear, NP while in the presence of Nance Pear, NP.  I, Nance Pear, NP, have reviewed all documentation for this visit. The documentation on 05/17/20 for the exam, diagnosis, procedures, and orders are all accurate and complete.

## 2020-05-18 LAB — URINE CULTURE
MICRO NUMBER:: 11654594
SPECIMEN QUALITY:: ADEQUATE

## 2020-05-19 ENCOUNTER — Other Ambulatory Visit: Payer: Self-pay

## 2020-05-19 ENCOUNTER — Telehealth: Payer: Self-pay | Admitting: Family

## 2020-05-19 DIAGNOSIS — R809 Proteinuria, unspecified: Secondary | ICD-10-CM

## 2020-05-19 DIAGNOSIS — R739 Hyperglycemia, unspecified: Secondary | ICD-10-CM

## 2020-05-19 MED ORDER — METOPROLOL SUCCINATE ER 100 MG PO TB24: 100.0000 mg | ORAL_TABLET | Freq: Every day | ORAL | 1 refills | Status: DC

## 2020-05-19 MED ORDER — TELMISARTAN-HCTZ 80-25 MG PO TABS: 1.0000 | ORAL_TABLET | Freq: Every day | ORAL | 1 refills | Status: DC

## 2020-05-19 NOTE — Telephone Encounter (Signed)
Patient advised of results and  Scheduled to come back 06-02-2020 for repeat urine and A1c. She is felling better.

## 2020-05-19 NOTE — Telephone Encounter (Signed)
Urine culture just shows mixed bacteria- is she feeling better on keflex?   There is some protein in her urine. Let's have her return to the lab in 2 weeks to repeat urine and I also want to check a follow up A1C.

## 2020-06-02 ENCOUNTER — Other Ambulatory Visit: Payer: Medicare PPO

## 2020-06-09 ENCOUNTER — Other Ambulatory Visit (INDEPENDENT_AMBULATORY_CARE_PROVIDER_SITE_OTHER): Payer: Medicare PPO

## 2020-06-09 ENCOUNTER — Other Ambulatory Visit: Payer: Self-pay

## 2020-06-09 DIAGNOSIS — R809 Proteinuria, unspecified: Secondary | ICD-10-CM | POA: Diagnosis not present

## 2020-06-09 DIAGNOSIS — R739 Hyperglycemia, unspecified: Secondary | ICD-10-CM | POA: Diagnosis not present

## 2020-06-09 LAB — URINALYSIS, ROUTINE W REFLEX MICROSCOPIC
Hgb urine dipstick: NEGATIVE
Ketones, ur: NEGATIVE
Nitrite: NEGATIVE
Specific Gravity, Urine: 1.025 (ref 1.000–1.030)
Total Protein, Urine: 30 — AB
Urine Glucose: NEGATIVE
Urobilinogen, UA: 0.2 (ref 0.0–1.0)
pH: 5.5 (ref 5.0–8.0)

## 2020-06-09 LAB — HEMOGLOBIN A1C: Hgb A1c MFr Bld: 5.7 % (ref 4.6–6.5)

## 2020-06-10 ENCOUNTER — Telehealth: Payer: Self-pay | Admitting: Family

## 2020-06-10 DIAGNOSIS — R809 Proteinuria, unspecified: Secondary | ICD-10-CM

## 2020-06-10 NOTE — Telephone Encounter (Signed)
She still has some protein in her urine. I would like her to complete 24 hr urine. Order placed. Is she having any uti symptoms ? Burning/frequency? If so, please add Keflex 500mg  bid x 5 days.

## 2020-06-12 NOTE — Telephone Encounter (Signed)
Patient advised of results and additional test. She said her husband will come in tomorrow to pick up the 24 hr urine container.  She denies any urinary symptoms.

## 2020-06-19 ENCOUNTER — Other Ambulatory Visit: Payer: Self-pay

## 2020-06-19 ENCOUNTER — Other Ambulatory Visit: Payer: Medicare PPO

## 2020-06-19 DIAGNOSIS — R809 Proteinuria, unspecified: Secondary | ICD-10-CM | POA: Diagnosis not present

## 2020-06-19 NOTE — Progress Notes (Signed)
24 hr urine  Total vol = 76mL Start: 06/18/20 @ 8:30am End:  06/19/20 @ 7:00am

## 2020-06-21 ENCOUNTER — Other Ambulatory Visit: Payer: Self-pay

## 2020-06-21 ENCOUNTER — Ambulatory Visit (INDEPENDENT_AMBULATORY_CARE_PROVIDER_SITE_OTHER): Payer: Medicare PPO | Admitting: Family

## 2020-06-21 ENCOUNTER — Encounter: Payer: Self-pay | Admitting: Family

## 2020-06-21 VITALS — BP 115/69 | HR 70 | Temp 98.4°F | Resp 16 | Ht 63.5 in | Wt 152.0 lb

## 2020-06-21 DIAGNOSIS — E781 Pure hyperglyceridemia: Secondary | ICD-10-CM

## 2020-06-21 DIAGNOSIS — I10 Essential (primary) hypertension: Secondary | ICD-10-CM | POA: Diagnosis not present

## 2020-06-21 DIAGNOSIS — Z0000 Encounter for general adult medical examination without abnormal findings: Secondary | ICD-10-CM | POA: Diagnosis not present

## 2020-06-21 DIAGNOSIS — E876 Hypokalemia: Secondary | ICD-10-CM | POA: Diagnosis not present

## 2020-06-21 LAB — SPECIMEN STATUS REPORT

## 2020-06-21 LAB — LIPID PANEL
Cholesterol: 180 mg/dL (ref 0–200)
HDL: 68.5 mg/dL (ref >39.00)
Total CHOL/HDL Ratio: 3
Triglycerides: 1027 mg/dL — ABNORMAL HIGH (ref 0.0–149.0)

## 2020-06-21 LAB — COMPREHENSIVE METABOLIC PANEL WITH GFR
ALT: 7 U/L (ref 0–35)
AST: 34 U/L (ref 0–37)
Albumin: 3.6 g/dL (ref 3.5–5.2)
Alkaline Phosphatase: 35 U/L — ABNORMAL LOW (ref 39–117)
BUN: 23 mg/dL (ref 6–23)
CO2: 29 meq/L (ref 19–32)
Calcium: 9.2 mg/dL (ref 8.4–10.5)
Chloride: 96 meq/L (ref 96–112)
Creatinine, Ser: 1.21 mg/dL — ABNORMAL HIGH (ref 0.40–1.20)
GFR: 44.8 mL/min — ABNORMAL LOW
Glucose, Bld: 114 mg/dL — ABNORMAL HIGH (ref 70–99)
Potassium: 4.1 meq/L (ref 3.5–5.1)
Sodium: 138 meq/L (ref 135–145)
Total Bilirubin: 0.8 mg/dL (ref 0.2–1.2)
Total Protein: 6 g/dL (ref 6.0–8.3)

## 2020-06-21 LAB — LDL CHOLESTEROL, DIRECT: Direct LDL: 43 mg/dL

## 2020-06-21 MED ORDER — SHINGRIX 50 MCG/0.5ML IM SUSR: INTRAMUSCULAR | 1 refills | Status: DC

## 2020-06-21 NOTE — Patient Instructions (Signed)
Please continue lab work prior to leaving.

## 2020-06-21 NOTE — Progress Notes (Signed)
Subjective:   By signing my name below, I, Shehryar Baig, attest that this documentation has been prepared under the direction and in the presence of Debbrah Alar, NP. 06/21/2020   Patient ID: [name], female    DOB: [birth year], 75 y.o.   MRN: [id number]  No chief complaint on file.   HPI Patient is in today for a comprehensive physical exam.  She denies any cough or cold symptoms, diarrhea, dysuria, frequency, and hematuria at this time.  Hypertension- She takes 80-25 mg Micardis HCT daily PO and 100 mg topral-XL daily PO to manage her hypertension.  BP Readings from Last 3 Encounters:  06/21/20 115/69  05/17/20 127/77  12/22/19 138/90   Immunizations: She is UTD on Covid 19 vaccine. She has not gotten the fourth booster shot. She is UTD on tetanus vaccine. She is willing to get an updated shingles vaccine. She is UTD on the pneumonia vaccination. She is not interested in getting the flu vaccine. Colonoscopy: Last completed on 06/14/2019. Diverticulosis and 6 polyps found. Repeat in 3 years.  Dexa: Last completed on 10/16/2016. Osteoporosis was found. Repeat in 2 years. Pap Smear: Last completed on 03/12/2011.   Mammogram: Last completed 07/22/19. Results are normal. Repeat in one year.   Past Medical History:  Diagnosis Date  . Cervical dysplasia   . Endometrial hyperplasia   . Fibroid    Atypical leiomyoma  . History of colon polyps   . Hyperglycemia 05/23/2014  . Hyperglycemia   . Hypertension     Past Surgical History:  Procedure Laterality Date  . ABDOMINAL HYSTERECTOMY  2004   TAH_BSO  . BREAST BIOPSY Left 2000  . BREAST BIOPSY Bilateral 1999  . BREAST EXCISIONAL BIOPSY Left    benign  . BREAST SURGERY  1998   Benign left breast lump  . COLPOSCOPY    . EXCISION MASS LOWER EXTREMETIES Right 05/30/2016   Procedure: EXCISION RIGHT THIGH MASS;  Surgeon: Erroll Luna, MD;  Location: [facility];  Service: General;  Laterality: Right;  EXCISION RIGHT  THIGH MASS  . HEMORRHOID SURGERY  1980's  . LYMPH NODE BIOPSY Left 1975   ? "removed lymph nodes in left leg"--benign per pt  . OOPHORECTOMY     BSO  . TONSILLECTOMY  1990    Family History  Problem Relation Age of Onset  . Hypertension Mother   . Heart disease Mother   . Diabetes Mother   . Breast cancer Mother 20  . Heart disease Father   . Cancer Father        prostate  . Cancer Sister 83       Leukemia, deceased  . Breast cancer Maternal Aunt        over 27  . Breast cancer Maternal Grandmother        over 32  . Hypertension Brother   . Diabetes Brother   . Stroke Brother     Social History   Socioeconomic History  . Marital status: Married    Spouse name: Not on file  . Number of children: Not on file  . Years of education: Not on file  . Highest education level: Not on file  Occupational History  . Not on file  Tobacco Use  . Smoking status: Former Smoker    Packs/day: 0.25    Years: 30.00    Pack years: 7.50    Types: Cigarettes    Quit date: 2013    Years since quitting: 9.3  . Smokeless  tobacco: Never Used  Vaping Use  . Vaping Use: Never used  Substance and Sexual Activity  . Alcohol use: Yes    Alcohol/week: 0.0 standard drinks    Comment: 2-4 glasses of wine on weekends  . Drug use: No  . Sexual activity: Yes    Birth control/protection: Surgical    Comment: 1st intercourse 75 yo-fewer than 5 partners  Other Topics Concern  . 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   Enjoys reading, yard work, cares for her mother during the day, her brother helps during then night, enjoys poker   1 daughter (in [place]) and 2 grand-daughters (In [place]- age 3 and 30)   Social Determinants of Health   Financial Resource Strain: [place]   . Difficulty of Paying Living Expenses: Not hard at all  Food  Insecurity: No Food Insecurity  . Worried About Charity fundraiser in the Last Year: Never true  . Ran Out of Food in the Last Year: Never true  Transportation Needs: No Transportation Needs  . Lack of Transportation (Medical): No  . Lack of Transportation (Non-Medical): No  Physical Activity: Not on file  Stress: Not on file  Social Connections: Not on file  Intimate Partner Violence: Not on file    Outpatient Medications Prior to Visit  Medication Sig Dispense Refill  . acyclovir (ZOVIRAX) 200 MG capsule Take 1 capsule (200 mg total) by mouth 5 (five) times daily. 30 capsule 6  . Calcium Carbonate-Vitamin D (CALCIUM + D PO) Take 2 tablets by mouth daily.     Marland Kitchen estradiol (ESTRACE) 0.5 MG tablet Take 1/2 tab po daily. 45 tablet 4  . KRILL OIL PO Take 1 tablet by mouth daily.    . metoprolol succinate (TOPROL-XL) 100 MG 24 hr tablet Take 1 tablet (100 mg total) by mouth daily. Take with or immediately following a meal. 90 tablet 1  . Multiple Vitamin (MULTIVITAMIN) tablet Take 1 tablet by mouth daily.    Marland Kitchen telmisartan-hydrochlorothiazide (MICARDIS HCT) 80-25 MG tablet Take 1 tablet by mouth daily. 90 tablet 1  . vitamin C (ASCORBIC ACID) 500 MG tablet Take 500 mg by mouth daily.     No facility-administered medications prior to visit.    Allergies  Allergen Reactions  . Atorvastatin     Muscle cramps  . Fenofibrate     "Locked her muscles up"  . Hydralazine     "kidney pain"  . Potassium-Containing Compounds Other (See Comments)    Burning  sensation in kidney  area  . Latex Rash    Or just redness     Review of Systems  HENT: Negative for congestion and sore throat.   Respiratory: Negative for cough.   Gastrointestinal: Negative for diarrhea.  Genitourinary: Negative for dysuria, frequency and hematuria.       Objective:    Physical Exam Constitutional:      Appearance: She is well-developed.  HENT:     Head: Normocephalic.     Right Ear: Tympanic membrane and  external ear normal.     Left Ear: Tympanic membrane and external ear normal.  Eyes:     Extraocular Movements: Extraocular movements intact.     Pupils: Pupils are equal, round, and reactive to light.     Comments: No  nystagmus  Neck:     Thyroid: No thyromegaly.  Cardiovascular:     Rate and Rhythm: Normal rate and regular rhythm.     Pulses: Normal pulses.     Heart sounds: Normal heart sounds. No murmur heard.   Pulmonary:     Effort: Pulmonary effort is normal. No respiratory distress.     Breath sounds: Normal breath sounds. No wheezing.  Abdominal:     General: Bowel sounds are normal.     Palpations: Abdomen is soft.     Tenderness: There is no abdominal tenderness.  Musculoskeletal:     Cervical back: Neck supple.     Comments: 5/5 strength on upper and lower extremities.   Skin:    General: Skin is warm and dry.  Neurological:     Mental Status: She is alert and oriented to person, place, and time.  Psychiatric:        Behavior: Behavior normal.        Thought Content: Thought content normal.        Judgment: Judgment normal.     LMP 03/04/2002  Wt Readings from Last 3 Encounters:  05/17/20 150 lb (68 kg)  12/22/19 147 lb (66.7 kg)  10/20/19 150 lb (68 kg)    Diabetic Foot Exam - Simple   No data filed    Lab Results  Component Value Date   WBC 7.3 05/19/2018   HGB 13.1 05/19/2018   HCT 38.1 05/19/2018   PLT 314.0 05/19/2018   GLUCOSE 157 (H) 12/22/2019   CHOL 192 11/20/2018   TRIG 243.0 (H) 11/20/2018   HDL 76.20 11/20/2018   LDLDIRECT 86.0 11/20/2018   ALT 11 11/20/2018   AST 28 11/20/2018   NA 138 12/22/2019   K 3.6 12/22/2019   CL 98 12/22/2019   CREATININE 1.32 (H) 12/22/2019   BUN 20 12/22/2019   CO2 27 12/22/2019   HGBA1C 5.7 06/09/2020    No results found for: TSH Lab Results  Component Value Date   WBC 7.3 05/19/2018   HGB 13.1 05/19/2018   HCT 38.1 05/19/2018   MCV 101.4 (H) 05/19/2018   PLT 314.0 05/19/2018   Lab  Results  Component Value Date   NA 138 12/22/2019   K 3.6 12/22/2019   CO2 27 12/22/2019   GLUCOSE 157 (H) 12/22/2019   BUN 20 12/22/2019   CREATININE 1.32 (H) 12/22/2019   BILITOT 0.7 11/20/2018   ALKPHOS 21 (L) 11/20/2018   AST 28 11/20/2018   ALT 11 11/20/2018   PROT 6.4 11/20/2018   ALBUMIN 3.8 11/20/2018   CALCIUM 9.1 12/22/2019   ANIONGAP 10 05/24/2016   GFR 69.67 03/23/2019   Lab Results  Component Value Date   CHOL 192 11/20/2018   Lab Results  Component Value Date   HDL 76.20 11/20/2018   No results found for: [facility] Lab Results  Component Value Date   TRIG 243.0 (H) 11/20/2018   Lab Results  Component Value Date   CHOLHDL 3 11/20/2018   Lab Results  Component Value Date   HGBA1C 5.7 06/09/2020       Assessment & Plan:   Problem List Items Addressed This Visit   None      No orders of the defined types were placed in this encounter.   I, Shehryar [place], personally preformed the services described in this documentation.  All medical record entries made by the scribe were at my direction and in my presence.  I have reviewed the chart  and discharge instructions (if applicable) and agree that the record reflects my personal performance and is accurate and complete. 06/21/2020   I,Shehryar Baig,acting as a Education administrator for Nance Pear, NP.,have documented all relevant documentation on the behalf of Nance Pear, NP,as directed by  Nance Pear, NP while in the presence of Nance Pear, NP.  I, Nance Pear, NP, have reviewed all documentation for this visit. The documentation on 06/22/20 for the exam, diagnosis, procedures, and orders are all accurate and complete.    Shehryar [employer]

## 2020-06-22 ENCOUNTER — Telehealth: Payer: Self-pay | Admitting: Family

## 2020-06-22 LAB — UPEP/TP, 24-HR URINE
Albumin, U: 35.9 %
Alpha 1, Urine: 13.5 %
Alpha 2, Urine: 16.6 %
Beta, Urine: 25.1 %
Gamma Globulin, Urine: 8.9 %
Protein, 24H Urine: 102 mg/24 hr (ref 30–150)
Protein, Ur: 13.6 mg/dL

## 2020-06-22 NOTE — Telephone Encounter (Signed)
Spoke with Aniceto Boss at [facility] [employer] [facility]. Total volume was submitted on the requisition and she will forward the information to the appropriate department for result to be calculated.

## 2020-06-22 NOTE — Assessment & Plan Note (Signed)
BP is stable. Continue 80-25 mg Micardis HCT daily PO and 100 mg toprol-XL daily PO t

## 2020-06-22 NOTE — Telephone Encounter (Signed)
Thank you :)

## 2020-06-22 NOTE — Telephone Encounter (Signed)
We are responsible for submitting total volume. We write it on the requisition and the label that goes on the cup.  90% of the time the lab still says we did not submit it.  I also document it in the lab encounter.  I will call the lab this afternoon.

## 2020-06-22 NOTE — Telephone Encounter (Signed)
Tricia/Charisma- please see 24 hr result, is our lab responsible for submitting the volume or is this something that is noted at the lab?  If we are unable to find this value, can you please explain what happened to pt and request that she repeat at her convenience? Our apologies.

## 2020-06-22 NOTE — Assessment & Plan Note (Signed)
>>  ASSESSMENT AND PLAN FOR ESSENTIAL HYPERTENSION, BENIGN WRITTEN ON 06/22/2020  1:19 PM BY O'SULLIVAN, Zahraa Bhargava, NP  BP is stable. Continue 80-25 mg Micardis HCT daily PO and 100 mg toprol-XL daily PO t

## 2020-06-22 NOTE — Assessment & Plan Note (Signed)
Discussed healthy diet, exercise, weight loss.

## 2020-06-25 ENCOUNTER — Telehealth: Payer: Self-pay | Admitting: Family

## 2020-06-25 DIAGNOSIS — R809 Proteinuria, unspecified: Secondary | ICD-10-CM

## 2020-06-25 NOTE — Telephone Encounter (Addendum)
Please advise pt that her 24 hr urine looks OK. She is spilling some protein but not large amounts  Cause is likely her hx of high blood pressure.  I would recommend that we have her complete a few more blood tests to complete her work up  and if they are OK, we will just continue to monitor her kidney function and keep her blood pressure controlled.  Order placed for ANA/Hepatitis testing.

## 2020-06-26 NOTE — Telephone Encounter (Signed)
Results given to patient and scheduled to come in for additional labs on 07-04-2020

## 2020-07-04 ENCOUNTER — Other Ambulatory Visit: Payer: Self-pay

## 2020-07-04 ENCOUNTER — Other Ambulatory Visit (INDEPENDENT_AMBULATORY_CARE_PROVIDER_SITE_OTHER): Payer: Medicare PPO

## 2020-07-04 DIAGNOSIS — R809 Proteinuria, unspecified: Secondary | ICD-10-CM | POA: Diagnosis not present

## 2020-07-06 LAB — HEPATITIS PANEL, ACUTE
Hep A IgM: NONREACTIVE
Hep B C IgM: NONREACTIVE
Hepatitis B Surface Ag: NONREACTIVE
Hepatitis C Ab: NONREACTIVE
SIGNAL TO CUT-OFF: 0.03 (ref <1.00)

## 2020-07-06 LAB — ANA: Anti Nuclear Antibody (ANA): NEGATIVE

## 2020-10-25 ENCOUNTER — Ambulatory Visit: Payer: Medicare PPO | Admitting: Nurse Practitioner

## 2020-11-20 DIAGNOSIS — D1801 Hemangioma of skin and subcutaneous tissue: Secondary | ICD-10-CM | POA: Diagnosis not present

## 2020-11-20 DIAGNOSIS — D485 Neoplasm of uncertain behavior of skin: Secondary | ICD-10-CM | POA: Diagnosis not present

## 2020-12-22 ENCOUNTER — Other Ambulatory Visit: Payer: Self-pay

## 2020-12-22 ENCOUNTER — Ambulatory Visit: Payer: Medicare PPO | Admitting: Family

## 2020-12-22 VITALS — BP 129/83 | HR 75 | Temp 98.1°F | Resp 16 | Wt 147.0 lb

## 2020-12-22 DIAGNOSIS — R739 Hyperglycemia, unspecified: Secondary | ICD-10-CM | POA: Diagnosis not present

## 2020-12-22 DIAGNOSIS — E785 Hyperlipidemia, unspecified: Secondary | ICD-10-CM

## 2020-12-22 DIAGNOSIS — E781 Pure hyperglyceridemia: Secondary | ICD-10-CM

## 2020-12-22 DIAGNOSIS — I10 Essential (primary) hypertension: Secondary | ICD-10-CM

## 2020-12-22 DIAGNOSIS — F4321 Adjustment disorder with depressed mood: Secondary | ICD-10-CM | POA: Diagnosis not present

## 2020-12-22 MED ORDER — METOPROLOL SUCCINATE ER 100 MG PO TB24: 100.0000 mg | ORAL_TABLET | Freq: Every day | ORAL | 1 refills | Status: DC

## 2020-12-22 MED ORDER — TELMISARTAN-HCTZ 80-25 MG PO TABS: 1.0000 | ORAL_TABLET | Freq: Every day | ORAL | 1 refills | Status: DC

## 2020-12-22 NOTE — Assessment & Plan Note (Signed)
Will repeat lipid panel

## 2020-12-22 NOTE — Assessment & Plan Note (Signed)
>>  ASSESSMENT AND PLAN FOR ESSENTIAL HYPERTENSION, BENIGN WRITTEN ON 12/22/2020 10:43 AM BY O'SULLIVAN, Larz Mark, NP  BP at goal on toprol xl and micardis HCT. Continue same.   Declines flu shot.

## 2020-12-22 NOTE — Assessment & Plan Note (Signed)
She seems to be grieving appropriately. She has a strong family and friend network that is helping her. Monitor.

## 2020-12-22 NOTE — Assessment & Plan Note (Signed)
BP at goal on toprol xl and micardis HCT. Continue same.   Declines flu shot.

## 2020-12-22 NOTE — Progress Notes (Signed)
Subjective:   By signing my name below, I, Zite Okoli, attest that this documentation has been prepared under the direction and in the presence of Debbrah Alar, NP 12/22/2020     Patient ID: [name], female    DOB: 19-Apr-1945, 75 y.o.   MRN: [id number]  Chief Complaint  Patient presents with   Hypertension    Here for follow up   Grieving reaction    Husband passed awary 11-29-20    HPI Patient is in today for an office visit and 6 month f/u.   Her husband recently died of cancer in 14-Dec-2020. She has good family and friend support and they come to visit her regularly. She alternates between good and bad days.  Hypertension- Her blood pressure is stable at this visit. She is managing her blood pressure with 100 mg metoprolol and 80-25 mg micardis.  She is requesting for a refill on the two medications.  BP Readings from Last 3 Encounters:  12/22/20 129/83  06/21/20 115/69  05/17/20 127/77   Immunizations- She is not interested in the flu vaccine. She is scheduled to receive the shingles vaccine. She has 2 Pfizer Covid-19 vaccines at this time.  Past Medical History:  Diagnosis Date   Cervical dysplasia    Endometrial hyperplasia    Fibroid    Atypical leiomyoma   History of colon polyps    Hyperglycemia 05/23/2014   Hyperglycemia    Hypertension     Past Surgical History:  Procedure Laterality Date   ABDOMINAL HYSTERECTOMY  2004   TAH_BSO   BREAST BIOPSY Left 2000   BREAST BIOPSY Bilateral 1999   BREAST EXCISIONAL BIOPSY Left    benign   BREAST SURGERY  1998   Benign left breast lump   COLPOSCOPY     EXCISION MASS LOWER EXTREMETIES Right 05/30/2016   Procedure: EXCISION RIGHT THIGH MASS;  Surgeon: Erroll Luna, MD;  Location: [facility];  Service: General;  Laterality: Right;  EXCISION RIGHT THIGH MASS   HEMORRHOID SURGERY  1980's   LYMPH NODE BIOPSY Left 1975   ? "removed lymph nodes in left leg"--benign per pt   OOPHORECTOMY     BSO    TONSILLECTOMY  1990    Family History  Problem Relation Age of Onset   Hypertension Mother    Heart disease Mother    Diabetes Mother    Breast cancer Mother 46   Heart disease Father    Cancer Father        prostate   Cancer Sister 72       Leukemia, deceased   Breast cancer Maternal Aunt        over 58   Breast cancer Maternal Grandmother        over 70   Hypertension Brother    Diabetes Brother    Stroke Brother     Social History   Socioeconomic History   Marital status: Married    Spouse name: Not on file   Number of children: Not on file   Years of education: Not on file   Highest education level: Not on file  Occupational History   Not on file  Tobacco Use   Smoking status: Former    Packs/day: 0.25    Years: 30.00    Pack years: 7.50    Types: Cigarettes    Quit date: 2013    Years since quitting: 9.8   Smokeless tobacco: Never  Vaping Use   Vaping Use: Never  used  Substance and Sexual Activity   Alcohol use: Yes    Alcohol/week: 0.0 standard drinks    Comment: 2-4 glasses of wine on weekends   Drug use: No   Sexual activity: Yes    Birth control/protection: Surgical    Comment: 1st intercourse 75 yo-fewer than 5 partners  Other Topics Concern   Not on file  Social History Narrative   2 yrs of college (business Photographer)   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   Enjoys reading, yard work, cares for her mother during the day, her brother helps during then night, enjoys poker   1 daughter (in [place]) and 2 grand-daughters (In [place]- age 43 and 65)   Social Determinants of Health   Financial Resource Strain: Not on file  Food Insecurity: Not on file  Transportation Needs: Not on file  Physical Activity: Not on file  Stress: Not on file  Social Connections: Not on file  Intimate Partner Violence: Not on file    Outpatient Medications Prior to Visit  Medication Sig  Dispense Refill   acyclovir (ZOVIRAX) 200 MG capsule Take 1 capsule (200 mg total) by mouth 5 (five) times daily. 30 capsule 6   Calcium Carbonate-Vitamin D (CALCIUM + D PO) Take 2 tablets by mouth daily.      estradiol (ESTRACE) 0.5 MG tablet Take 1/2 tab po daily. 45 tablet 4   KRILL OIL PO Take 1 tablet by mouth daily.     Multiple Vitamin (MULTIVITAMIN) tablet Take 1 tablet by mouth daily.     vitamin C (ASCORBIC ACID) 500 MG tablet Take 500 mg by mouth daily.     Zoster Vaccine Adjuvanted [facility]) injection Inject 0.5mg  IM now and again in 2-6 months. 0.5 mL 1   metoprolol succinate (TOPROL-XL) 100 MG 24 hr tablet Take 1 tablet (100 mg total) by mouth daily. Take with or immediately following a meal. 90 tablet 1   telmisartan-hydrochlorothiazide (MICARDIS HCT) 80-25 MG tablet Take 1 tablet by mouth daily. 90 tablet 1   No facility-administered medications prior to visit.    Allergies  Allergen Reactions   Atorvastatin     Muscle cramps   Fenofibrate     "Locked her muscles up"   Hydralazine     "kidney pain"   Potassium-Containing Compounds Other (See Comments)    Burning  sensation in kidney  area   Latex Rash    Or just redness     Review of Systems  Constitutional:  Negative for fever.  HENT:  Negative for ear pain and hearing loss.        (-)nystagmus (-)adenopathy  Eyes:  Negative for blurred vision.  Respiratory:  Negative for cough, shortness of breath and wheezing.   Cardiovascular:  Negative for chest pain and leg swelling.  Gastrointestinal:  Negative for blood in stool, diarrhea, nausea and vomiting.  Genitourinary:  Negative for dysuria and frequency.  Musculoskeletal:  Negative for joint pain and myalgias.  Skin:  Negative for rash.  Neurological:  Negative for headaches.  Psychiatric/Behavioral:  Negative for depression. The patient is not nervous/anxious.       Objective:    Physical Exam Constitutional:      General: She is not in acute  distress.    Appearance: Normal appearance. She is not ill-appearing.  HENT:     Head: Normocephalic and atraumatic.  Right Ear: External ear normal.     Left Ear: External ear normal.  Eyes:     Extraocular Movements: Extraocular movements intact.     Pupils: Pupils are equal, round, and reactive to light.  Cardiovascular:     Rate and Rhythm: Normal rate and regular rhythm.     Pulses: Normal pulses.     Heart sounds: Normal heart sounds. No murmur heard. Pulmonary:     Effort: Pulmonary effort is normal. No respiratory distress.     Breath sounds: Normal breath sounds. No wheezing or rhonchi.  Abdominal:     General: Bowel sounds are normal. There is no distension.     Palpations: Abdomen is soft.     Tenderness: There is no abdominal tenderness. There is no guarding or rebound.  Musculoskeletal:     Cervical back: Neck supple.  Lymphadenopathy:     Cervical: No cervical adenopathy.  Skin:    General: Skin is warm and dry.  Neurological:     Mental Status: She is alert and oriented to person, place, and time.  Psychiatric:        Behavior: Behavior normal.        Judgment: Judgment normal.    BP 129/83 (BP Location: Right Arm, Patient Position: Sitting, Cuff Size: Small)   Pulse 75   Temp 98.1 F (36.7 C) (Oral)   Resp 16   Wt 147 lb (66.7 kg)   LMP 03/04/2002   SpO2 100%   BMI 25.63 kg/m  Wt Readings from Last 3 Encounters:  12/22/20 147 lb (66.7 kg)  06/21/20 152 lb (68.9 kg)  05/17/20 150 lb (68 kg)    Diabetic Foot Exam - Simple   No data filed    Lab Results  Component Value Date   WBC 7.3 05/19/2018   HGB 13.1 05/19/2018   HCT 38.1 05/19/2018   PLT 314.0 05/19/2018   GLUCOSE 114 (H) 06/21/2020   CHOL 180 06/21/2020   TRIG (H) 06/21/2020    1027.0 Triglyceride is over 400; calculations on Lipids are invalid.   HDL 68.50 06/21/2020   LDLDIRECT 43.0 06/21/2020   ALT 7 06/21/2020   AST 34 06/21/2020   NA 138 06/21/2020   K 4.1 06/21/2020    CL 96 06/21/2020   CREATININE 1.21 (H) 06/21/2020   BUN 23 06/21/2020   CO2 29 06/21/2020   HGBA1C 5.7 06/09/2020    No results found for: TSH Lab Results  Component Value Date   WBC 7.3 05/19/2018   HGB 13.1 05/19/2018   HCT 38.1 05/19/2018   MCV 101.4 (H) 05/19/2018   PLT 314.0 05/19/2018   Lab Results  Component Value Date   NA 138 06/21/2020   K 4.1 06/21/2020   CO2 29 06/21/2020   GLUCOSE 114 (H) 06/21/2020   BUN 23 06/21/2020   CREATININE 1.21 (H) 06/21/2020   BILITOT 0.8 06/21/2020   ALKPHOS 35 (L) 06/21/2020   AST 34 06/21/2020   ALT 7 06/21/2020   PROT 6.0 06/21/2020   ALBUMIN 3.6 06/21/2020   CALCIUM 9.2 06/21/2020   ANIONGAP 10 05/24/2016   GFR 44.80 (L) 06/21/2020   Lab Results  Component Value Date   CHOL 180 06/21/2020   Lab Results  Component Value Date   HDL 68.50 06/21/2020   No results found for: [facility] Lab Results  Component Value Date   TRIG (H) 06/21/2020    1027.0 Triglyceride is over 400; calculations on Lipids are invalid.   Lab Results  Component Value Date   CHOLHDL 3 06/21/2020   Lab Results  Component Value Date   HGBA1C 5.7 06/09/2020       Assessment & Plan:   Problem List Items Addressed This Visit       Unprioritized   Hypertriglyceridemia    Will repeat lipid panel.       Relevant Medications   metoprolol succinate (TOPROL-XL) 100 MG 24 hr tablet   telmisartan-hydrochlorothiazide (MICARDIS HCT) 80-25 MG tablet   Hyperglycemia   Relevant Orders   Basic metabolic panel   Hemoglobin A1c   Grief reaction - Primary    She seems to be grieving appropriately. She has a strong family and friend network that is helping her. Monitor.       Essential hypertension, benign    BP at goal on toprol xl and micardis HCT. Continue same.   Declines flu shot.       Relevant Medications   metoprolol succinate (TOPROL-XL) 100 MG 24 hr tablet   telmisartan-hydrochlorothiazide (MICARDIS HCT) 80-25 MG tablet   Other  Visit Diagnoses     Hyperlipidemia, unspecified hyperlipidemia type       Relevant Medications   metoprolol succinate (TOPROL-XL) 100 MG 24 hr tablet   telmisartan-hydrochlorothiazide (MICARDIS HCT) 80-25 MG tablet   Other Relevant Orders   Lipid panel       Meds ordered this encounter  Medications   metoprolol succinate (TOPROL-XL) 100 MG 24 hr tablet    Sig: Take 1 tablet (100 mg total) by mouth daily. Take with or immediately following a meal.    Dispense:  90 tablet    Refill:  1    Order Specific Question:   Supervising Provider    Answer:   Penni Homans A [[id number]]   telmisartan-hydrochlorothiazide (MICARDIS HCT) 80-25 MG tablet    Sig: Take 1 tablet by mouth daily.    Dispense:  90 tablet    Refill:  1    Order Specific Question:   Supervising Provider    Answer:   Penni Homans A [[id number]]    I,Zite Okoli,acting as a scribe for Nance Pear, NP.,have documented all relevant documentation on the behalf of Nance Pear, NP,as directed by  Nance Pear, NP while in the presence of Nance Pear, NP.   I, Debbrah Alar, NP, personally preformed the services described in this documentation.  All medical record entries made by the scribe were at my direction and in my presence.  I have reviewed the chart and discharge instructions (if applicable) and agree that the record reflects my personal performance and is accurate and complete. 12/22/2020

## 2020-12-27 ENCOUNTER — Telehealth: Payer: Self-pay | Admitting: Family

## 2020-12-27 NOTE — Addendum Note (Signed)
Addended by: Jiles Prows on: 12/27/2020 08:29 AM   Modules accepted: Orders

## 2020-12-27 NOTE — Telephone Encounter (Signed)
It looks like she forgot to go to the lab after her visit. Please call pt to schedule lab appointment.

## 2020-12-27 NOTE — Telephone Encounter (Signed)
Called patient and scheduled her to come in for labs 01-02-21

## 2021-01-02 ENCOUNTER — Other Ambulatory Visit: Payer: Medicare PPO

## 2021-01-03 ENCOUNTER — Other Ambulatory Visit: Payer: Medicare PPO

## 2021-01-08 ENCOUNTER — Other Ambulatory Visit: Payer: Self-pay | Admitting: Nurse Practitioner

## 2021-01-08 DIAGNOSIS — Z79890 Hormone replacement therapy (postmenopausal): Secondary | ICD-10-CM

## 2021-01-09 ENCOUNTER — Other Ambulatory Visit: Payer: Self-pay | Admitting: Nurse Practitioner

## 2021-01-09 DIAGNOSIS — Z79890 Hormone replacement therapy (postmenopausal): Secondary | ICD-10-CM

## 2021-01-09 MED ORDER — ESTRADIOL 0.5 MG PO TABS: ORAL_TABLET | ORAL | 0 refills | Status: DC

## 2021-01-09 NOTE — Telephone Encounter (Signed)
Patient called back because I refused her Estradiol with a note to call the office.   Her last AEX 10/20/19 Her last Mammo 07/21/19.  Patient said her husband recently passed away after a very difficult time. She said there is no way she can schedule exam and come here for that right now. I explained importance of mammo and gyn exam when taking estrogen. She said she will schedule AEX for after first of the year. I sent message to appointment desk to schedule her.  I told her I would relay her message to TW to advise regarding refill request.

## 2021-01-10 ENCOUNTER — Other Ambulatory Visit: Payer: Medicare PPO

## 2021-01-22 ENCOUNTER — Telehealth: Payer: Self-pay | Admitting: Family

## 2021-01-22 NOTE — Telephone Encounter (Signed)
See mychart.  

## 2021-01-28 IMAGING — MG DIGITAL SCREENING BILAT W/ TOMO W/ CAD
8 series · 8 of 24 positions shown · non-contrast
Comparison: Previous exam(s).

CLINICAL DATA: Screening.

EXAM:
DIGITAL SCREENING BILATERAL MAMMOGRAM WITH TOMO AND CAD

[L MLO synth-2D]
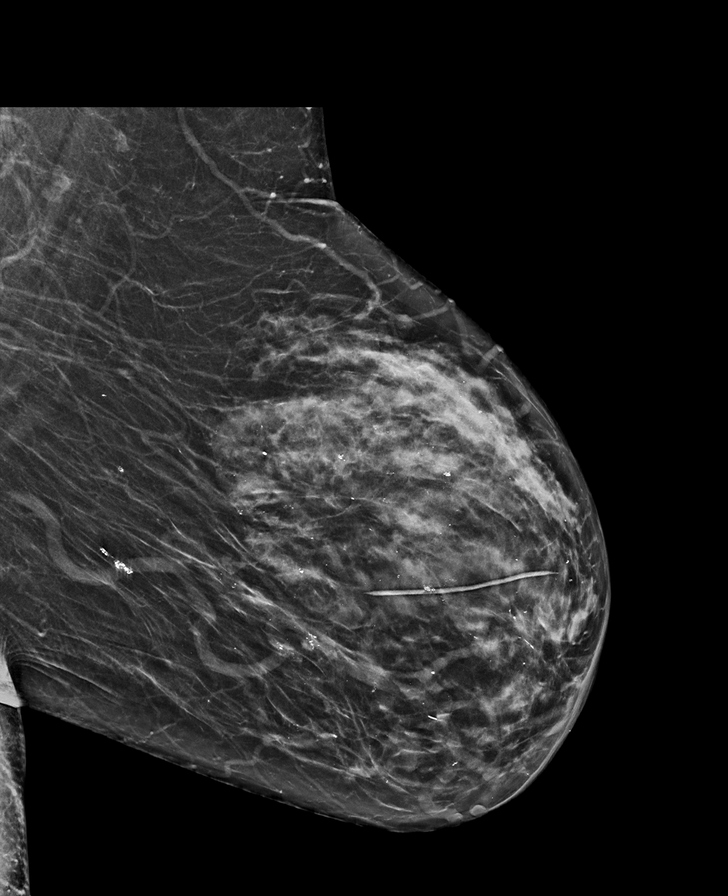

[L CC synth-2D]
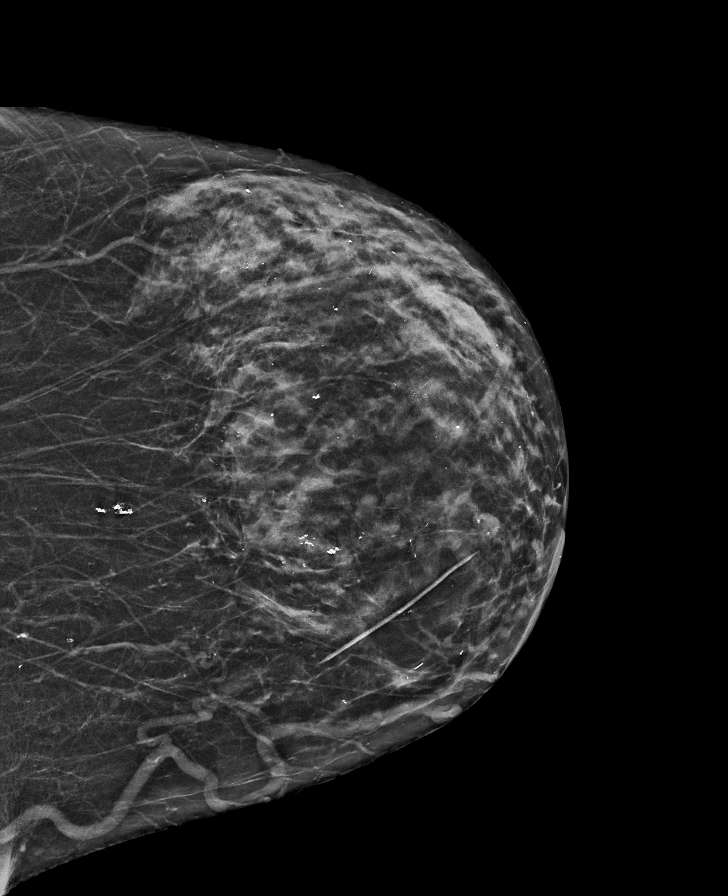

[R CC synth-2D]
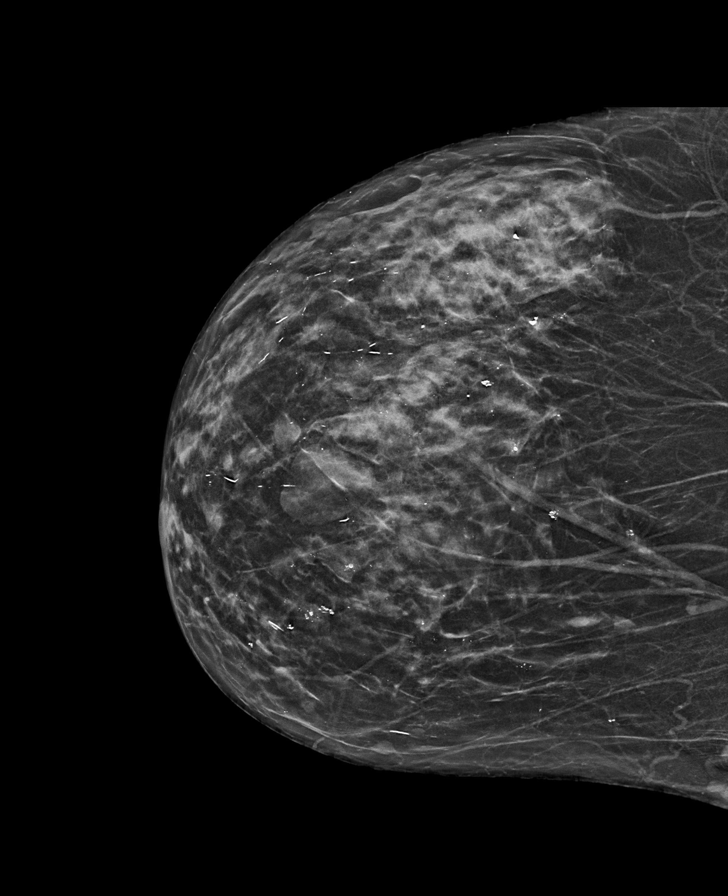

[R MLO synth-2D]
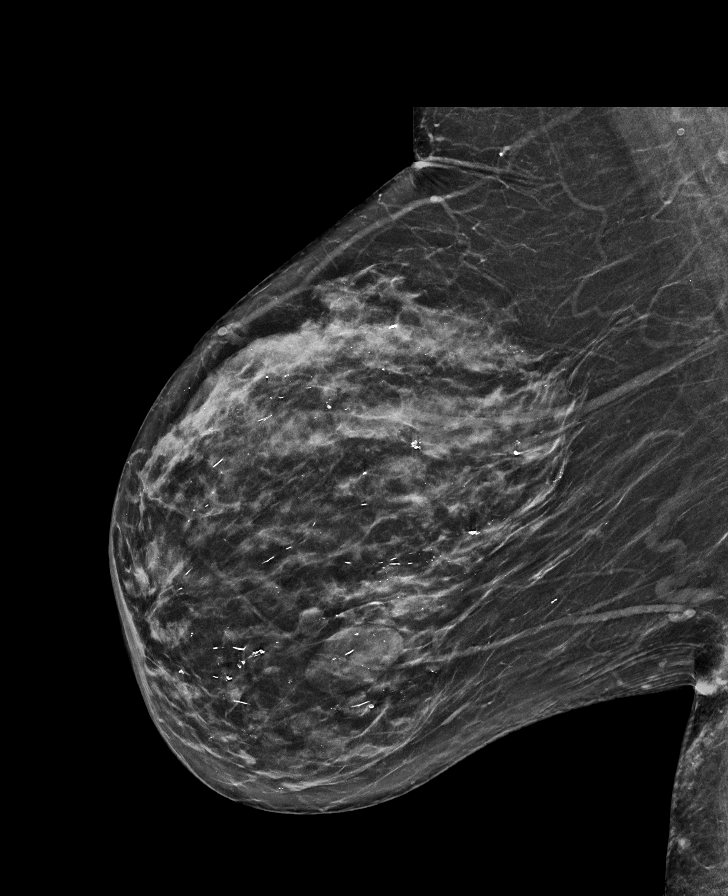

[L CC tomo · tomo slice 27/54.0]
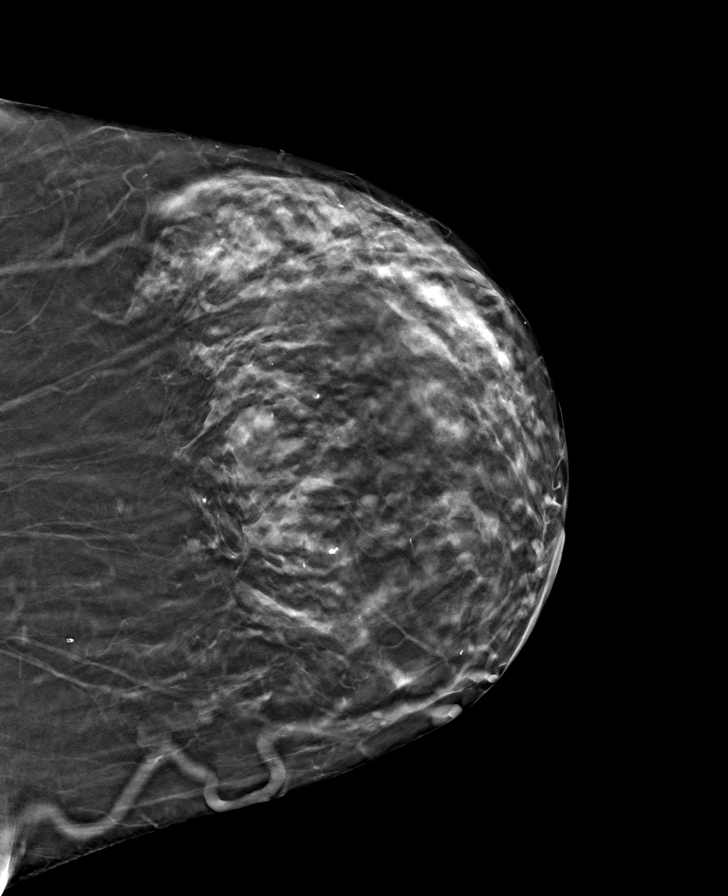

[R CC tomo · tomo slice 28/55.0]
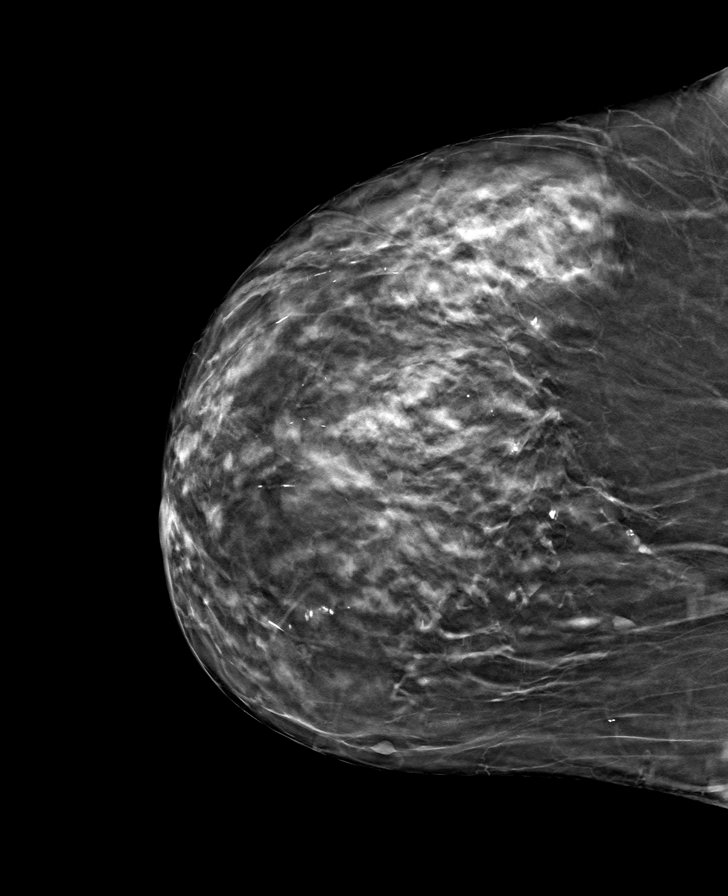

[L MLO tomo · tomo slice 32/63.0]
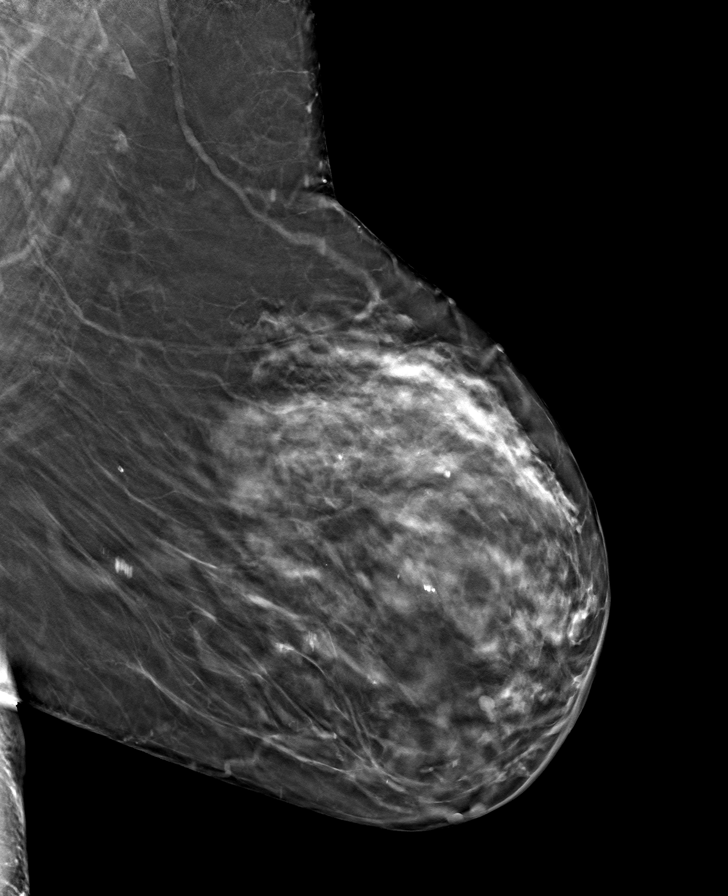

[R MLO tomo · tomo slice 33/64.0]
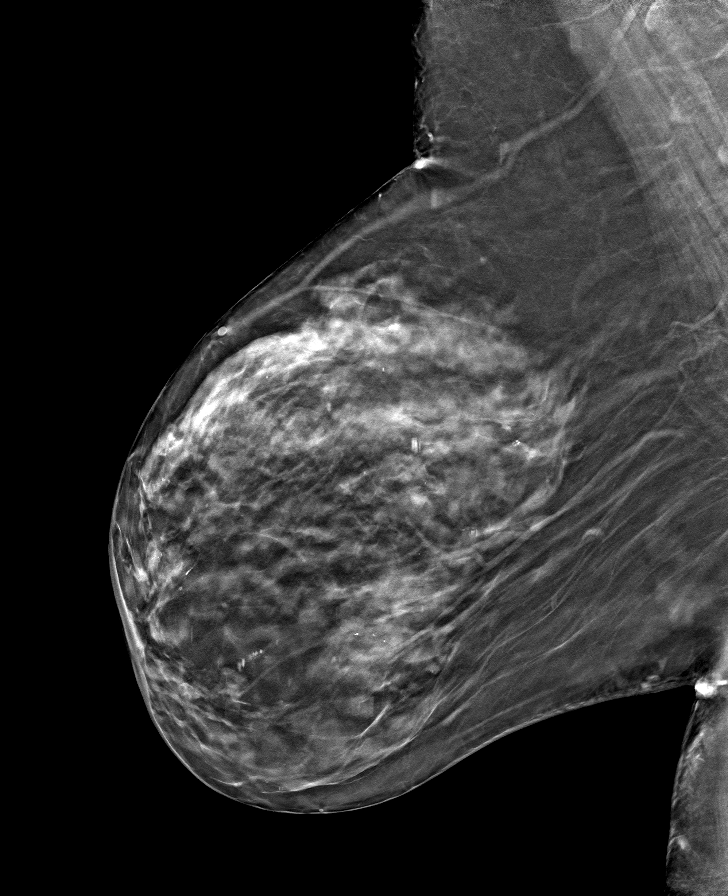

[8 of 24 positions shown; findings below may reference images not displayed]

ACR Breast Density Category c: The breast tissue is heterogeneously
dense, which may obscure small masses.
FINDINGS: There are no findings suspicious for malignancy. Images were
processed with CAD.
IMPRESSION: No mammographic evidence of malignancy. A result letter of this
screening mammogram will be mailed directly to the patient.

RECOMMENDATION:
Screening mammogram in one year. (Code:[id number])

BI-RADS CATEGORY  1: Negative.

## 2021-01-29 ENCOUNTER — Telehealth: Payer: Self-pay | Admitting: Family

## 2021-01-29 NOTE — Telephone Encounter (Signed)
Left message for patient to call back and schedule Medicare Annual Wellness Visit (AWV) in office.   If not able to come in office, please offer to do virtually or by telephone.  Left office number and my jabber [phone].  Last AWV:10/16/2017  Please schedule at anytime with Nurse Health Advisor.

## 2021-02-09 ENCOUNTER — Ambulatory Visit (INDEPENDENT_AMBULATORY_CARE_PROVIDER_SITE_OTHER): Payer: Medicare PPO

## 2021-02-09 VITALS — Ht 63.5 in | Wt 147.0 lb

## 2021-02-09 DIAGNOSIS — Z0000 Encounter for general adult medical examination without abnormal findings: Secondary | ICD-10-CM | POA: Diagnosis not present

## 2021-02-09 NOTE — Progress Notes (Signed)
Subjective:   [name] is a 75 y.o. female who presents for Medicare Annual (Subsequent) preventive examination.  I connected with [name] today by telephone and verified that I am speaking with the correct person using two identifiers. Location patient: home Location provider: work Persons participating in the virtual visit: patient, Marine scientist.    I discussed the limitations, risks, security and privacy concerns of performing an evaluation and management service by telephone and the availability of in person appointments. I also discussed with the patient that there may be a patient responsible charge related to this service. The patient expressed understanding and verbally consented to this telephonic visit.    Interactive audio and video telecommunications were attempted between this provider and patient, however failed, due to patient having technical difficulties OR patient did not have access to video capability.  We continued and completed visit with audio only.  Some vital signs may be absent or patient reported.   Time Spent with patient on telephone encounter: 20 minutes   Review of Systems     Cardiac Risk Factors include: advanced age (>79men, >14 women);hypertension;dyslipidemia     Objective:    Today's Vitals   02/09/21 1303  Weight: 147 lb (66.7 kg)  Height: 5' 3.5" (1.613 m)   Body mass index is 25.63 kg/m.  Advanced Directives 02/09/2021 08/24/2019 10/16/2017 09/23/2016 05/31/2016 05/30/2016 05/24/2016  Does Patient Have a Medical Advance Directive? Yes Yes Yes Yes Yes - Yes  Type of Advance Directive [place];Living will [place];Living will [place];Living will [place];Living will Living will - Living will  Does patient want to make changes to medical advance directive? - No - Patient declined - - - No - Patient declined No - Patient declined  Copy of [place]  in Chart? No - copy requested No - copy requested No - copy requested No - copy requested - - -    Current Medications (verified) Outpatient Encounter Medications as of 02/09/2021  Medication Sig   acyclovir (ZOVIRAX) 200 MG capsule Take 1 capsule (200 mg total) by mouth 5 (five) times daily.   Calcium Carbonate-Vitamin D (CALCIUM + D PO) Take 2 tablets by mouth daily.    estradiol (ESTRACE) 0.5 MG tablet Take 1/2 tab po daily.   KRILL OIL PO Take 1 tablet by mouth daily.   metoprolol succinate (TOPROL-XL) 100 MG 24 hr tablet Take 1 tablet (100 mg total) by mouth daily. Take with or immediately following a meal.   Multiple Vitamin (MULTIVITAMIN) tablet Take 1 tablet by mouth daily.   telmisartan-hydrochlorothiazide (MICARDIS HCT) 80-25 MG tablet Take 1 tablet by mouth daily.   vitamin C (ASCORBIC ACID) 500 MG tablet Take 500 mg by mouth daily.   Zoster Vaccine Adjuvanted [facility]) injection Inject 0.5mg  IM now and again in 2-6 months. (Patient not taking: Reported on 02/09/2021)   No facility-administered encounter medications on file as of 02/09/2021.    Allergies (verified) Atorvastatin, Fenofibrate, Hydralazine, Potassium-containing compounds, and Latex   History: Past Medical History:  Diagnosis Date   Cervical dysplasia    Endometrial hyperplasia    Fibroid    Atypical leiomyoma   History of colon polyps    Hyperglycemia 05/23/2014   Hyperglycemia    Hypertension    Past Surgical History:  Procedure Laterality Date   ABDOMINAL HYSTERECTOMY  2004   TAH_BSO   BREAST BIOPSY Left 2000   BREAST BIOPSY Bilateral 1999  BREAST EXCISIONAL BIOPSY Left    benign   BREAST SURGERY  1998   Benign left breast lump   COLPOSCOPY     EXCISION MASS LOWER EXTREMETIES Right 05/30/2016   Procedure: EXCISION RIGHT THIGH MASS;  Surgeon: Erroll Luna, MD;  Location: [facility];  Service: General;  Laterality: Right;  EXCISION RIGHT THIGH MASS   HEMORRHOID SURGERY  1980's   LYMPH NODE BIOPSY  Left 1975   ? "removed lymph nodes in left leg"--benign per pt   OOPHORECTOMY     BSO   TONSILLECTOMY  1990   Family History  Problem Relation Age of Onset   Hypertension Mother    Heart disease Mother    Diabetes Mother    Breast cancer Mother 35   Heart disease Father    Cancer Father        prostate   Cancer Sister 34       Leukemia, deceased   Breast cancer Maternal Aunt        over 61   Breast cancer Maternal Grandmother        over 74   Hypertension Brother    Diabetes Brother    Stroke Brother    Social History   Socioeconomic History   Marital status: Widowed    Spouse name: Not on file   Number of children: Not on file   Years of education: Not on file   Highest education level: Not on file  Occupational History   Not on file  Tobacco Use   Smoking status: Former    Packs/day: 0.25    Years: 30.00    Pack years: 7.50    Types: Cigarettes    Quit date: 2013    Years since quitting: 9.9   Smokeless tobacco: Never  Vaping Use   Vaping Use: Never used  Substance and Sexual Activity   Alcohol use: Yes    Alcohol/week: 0.0 standard drinks    Comment: 2-4 glasses of wine on weekends   Drug use: No   Sexual activity: Yes    Birth control/protection: Surgical    Comment: 1st intercourse 75 yo-fewer than 5 partners  Other Topics Concern   Not on file  Social History Narrative   2 yrs of college (business Photographer)   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   Enjoys reading, yard work, cares for her mother during the day, her brother helps during then night, enjoys poker   1 daughter (in [place]) and 2 grand-daughters (In [place]- age 51 and 24)   Social Determinants of Health   Financial Resource Strain: Low Risk    Difficulty of Paying Living Expenses: Not hard at all  Food Insecurity: No Food Insecurity   Worried About Charity fundraiser in the Last Year: Never true   Academic librarian in the Last Year: Never true  Transportation Needs: No Transportation Needs   Lack of Transportation (Medical): No   Lack of Transportation (Non-Medical): No  Physical Activity: Insufficiently Active   Days of Exercise per Week: 1 day   Minutes of Exercise per Session: 30 min  Stress: No Stress Concern Present   Feeling of Stress : Not at all  Social Connections: Moderately Isolated   Frequency of Communication with Friends and Family: More than three times a week   Frequency of Social Gatherings with Friends and Family: More than  three times a week   Attends Religious Services: Never   Active Member of Clubs or Organizations: Yes   Attends Archivist Meetings: More than 4 times per year   Marital Status: Widowed    Tobacco Counseling Counseling given: Not Answered   Clinical Intake:  Pre-visit preparation completed: Yes  Pain : No/denies pain     BMI - recorded: 25.63 Nutritional Status: BMI 25 -29 Overweight Nutritional Risks: None Diabetes: No  How often do you need to have someone help you when you read instructions, pamphlets, or other written materials from your doctor or pharmacy?: 1 - Never  Diabetic?No  Interpreter Needed?: No  Information entered by :: Caroleen Hamman LPN   Activities of Daily Living In your present state of health, do you have any difficulty performing the following activities: 02/09/2021 12/22/2020  Hearing? N N  Vision? N N  Difficulty concentrating or making decisions? N N  Walking or climbing stairs? N N  Dressing or bathing? N N  Doing errands, shopping? N N  Preparing Food and eating ? N -  Using the Toilet? N -  In the past six months, have you accidently leaked urine? N -  Do you have problems with loss of bowel control? N -  Managing your Medications? N -  Managing your Finances? N -  Housekeeping or managing your Housekeeping? N -  Some recent data might be hidden    Patient Care Team: Debbrah Alar, NP as PCP - General (Internal Medicine) Juanita Craver, MD as Consulting Physician (Gastroenterology) Charolotte Capuchin, MD as Consulting Physician (Dentistry) Tamela Gammon, NP as Nurse Practitioner (Gynecology)  Indicate any recent Medical Services you may have received from other than [facility] providers in the past year (date may be approximate).     Assessment:   This is a routine wellness examination for Sabrin.  Hearing/Vision screen Hearing Screening - Comments:: No issues Vision Screening - Comments:: Last eye exam-01/2021-Dr. Mendel Ryder  Dietary issues and exercise activities discussed: Current Exercise Habits: Home exercise routine, Type of exercise: walking, Time (Minutes): 30, Frequency (Times/Week): 1, Weekly Exercise (Minutes/Week): 30, Intensity: Mild, Exercise limited by: None identified   Goals Addressed             This Visit's Progress    Patient Stated       Maintain current health       Depression Screen PHQ 2/9 Scores 02/09/2021 12/22/2020 08/24/2019 03/23/2019 10/16/2017 09/23/2016 09/22/2015  PHQ - 2 Score 1 0 0 2 0 0 1  PHQ- 9 Score - - - 2 - - -    Fall Risk Fall Risk  02/09/2021 12/22/2020 08/24/2019 10/16/2017 09/23/2016  Falls in the past year? 0 0 0 No No  Number falls in past yr: 0 0 0 - -  Injury with Fall? 0 0 0 - -  Follow up Falls prevention discussed - Education provided;Falls prevention discussed - -    FALL RISK PREVENTION PERTAINING TO THE HOME:  Any stairs in or around the home? Yes  If so, are there any without handrails? No  Home free of loose throw rugs in walkways, pet beds, electrical cords, etc? Yes  Adequate lighting in your home to reduce risk of falls? Yes   ASSISTIVE DEVICES UTILIZED TO PREVENT FALLS:  Life alert? No  Use of a cane, walker or w/c? No  Grab bars in the bathroom? Yes  Shower chair or bench in shower? Yes  Elevated toilet seat or a  handicapped toilet? No   TIMED UP AND GO:  Was the test performed? No  . Phone visit   Cognitive Function:Normal cognitive status assessed by this Nurse Health Advisor. No abnormalities found.   MMSE - Mini Mental State Exam 09/22/2015  Orientation to time 5  Orientation to Place 5  Registration 3  Attention/ Calculation 5  Recall 3  Language- name 2 objects 2  Language- repeat 1  Language- follow 3 step command 3  Language- read & follow direction 1  Write a sentence 1  Copy design 1  Total score 30        Immunizations Immunization History  Administered Date(s) Administered   PFIZER(Purple Top)SARS-COV-2 Vaccination 05/06/2019, 06/02/2019   Pneumococcal Polysaccharide-23 11/20/2018   Td 09/19/2014   Zoster, Live 03/04/2012    TDAP status: Up to date  Flu Vaccine status: Declined, Education has been provided regarding the importance of this vaccine but patient still declined. Advised may receive this vaccine at local pharmacy or Health Dept. Aware to provide a copy of the vaccination record if obtained from local pharmacy or Health Dept. Verbalized acceptance and understanding.  Pneumococcal vaccine status: Due, Education has been provided regarding the importance of this vaccine. Advised may receive this vaccine at local pharmacy or Health Dept. Aware to provide a copy of the vaccination record if obtained from local pharmacy or Health Dept. Verbalized acceptance and understanding.  Covid-19 vaccine status: Information provided on how to obtain vaccines.   Qualifies for Shingles Vaccine? Yes   Zostavax completed Yes   Shingrix Completed?: No.    Education has been provided regarding the importance of this vaccine. Patient has been advised to call insurance company to determine out of pocket expense if they have not yet received this vaccine. Advised may also receive vaccine at local pharmacy or Health Dept. Verbalized acceptance and understanding.  Screening Tests Health Maintenance  Topic Date Due   Zoster Vaccines- Shingrix (1 of 2)  Never done   COVID-19 Vaccine (3 - Booster for Pfizer series) 07/28/2019   Pneumonia Vaccine 24+ Years old (2 - PCV) 11/20/2019   INFLUENZA VACCINE  06/01/2021 (Originally 10/02/2020)   MAMMOGRAM  07/20/2021   COLONOSCOPY (Pts 45-10yrs Insurance coverage will need to be confirmed)  06/15/2024   TETANUS/TDAP  09/18/2024   DEXA SCAN  Completed   Hepatitis C Screening  Completed   HPV VACCINES  Aged Out    Health Maintenance  Health Maintenance Due  Topic Date Due   Zoster Vaccines- Shingrix (1 of 2) Never done   COVID-19 Vaccine (3 - Booster for Pfizer series) 07/28/2019   Pneumonia Vaccine 76+ Years old (2 - PCV) 11/20/2019    Colorectal cancer screening: Type of screening: Colonoscopy. Completed 06/16/2019. Repeat every 3 years  Mammogram status: Due-Declined today  Bone Density status:Due-Declined today  Lung Cancer Screening: (Low Dose CT Chest recommended if Age 41-80 years, 30 pack-year currently smoking OR have quit w/in 15years.) does not qualify.     Additional Screening:  Hepatitis C Screening: Completed 07/04/2020  Vision Screening: Recommended annual ophthalmology exams for early detection of glaucoma and other disorders of the eye. Is the patient up to date with their annual eye exam?  Yes  Who is the provider or what is the name of the office in which the patient attends annual eye exams? Dr. Mendel Ryder   Dental Screening: Recommended annual dental exams for proper oral hygiene  Community Resource Referral / Chronic Care Management: CRR required this visit?  No   CCM required this visit?  No      Plan:     I have personally reviewed and noted the following in the patient's chart:   Medical and social history Use of alcohol, tobacco or illicit drugs  Current medications and supplements including opioid prescriptions.  Functional ability and status Nutritional status Physical activity Advanced directives List of other physicians Hospitalizations,  surgeries, and ER visits in previous 12 months Vitals Screenings to include cognitive, depression, and falls Referrals and appointments  In addition, I have reviewed and discussed with patient certain preventive protocols, quality metrics, and best practice recommendations. A written personalized care plan for preventive services as well as general preventive health recommendations were provided to patient.   Due to this being a telephonic visit, the after visit summary with patients personalized plan was offered to patient via mail or my-chart. Per request, patient was mailed a copy of [place], LPN   [id number]  Nurse Health Advisor  Nurse Notes: None

## 2021-02-09 NOTE — Patient Instructions (Signed)
[name] , Thank you for taking time to complete your Medicare Wellness Visit. I appreciate your ongoing commitment to your health goals. Please review the following plan we discussed and let me know if I can assist you in the future.   Screening recommendations/referrals: Colonoscopy: 06/16/2019-Due 06/16/2022 Mammogram: Declined today. Please call the office when you are ready to schedule.Declined today. Please call the office when you are ready to schedule. Bone Density:  Recommended yearly ophthalmology/optometry visit for glaucoma screening and checkup Recommended yearly dental visit for hygiene and checkup  Vaccinations: Influenza vaccine: Declined Pneumococcal vaccine: Due-May obtain vaccine at our office or your local pharmacy. Tdap vaccine: Up to date Shingles vaccine: Discuss with pharmacy   Covid-19:Booster available at the pharmacy  Advanced directives: Please bring a copy of Living Will and/or [place] for your chart when available.   Conditions/risks identified: See problem list  Next appointment: Follow up in one year for your annual wellness visit    Preventive Care 65 Years and Older, Female Preventive care refers to lifestyle choices and visits with your health care provider that can promote health and wellness. What does preventive care include? A yearly physical exam. This is also called an annual well check. Dental exams once or twice a year. Routine eye exams. Ask your health care provider how often you should have your eyes checked. Personal lifestyle choices, including: Daily care of your teeth and gums. Regular physical activity. Eating a healthy diet. Avoiding tobacco and drug use. Limiting alcohol use. Practicing safe sex. Taking low-dose aspirin every day. Taking vitamin and mineral supplements as recommended by your health care provider. What happens during an annual well check? The services and screenings done by your health  care provider during your annual well check will depend on your age, overall health, lifestyle risk factors, and family history of disease. Counseling  Your health care provider may ask you questions about your: Alcohol use. Tobacco use. Drug use. Emotional well-being. Home and relationship well-being. Sexual activity. Eating habits. History of falls. Memory and ability to understand (cognition). Work and work Statistician. Reproductive health. Screening  You may have the following tests or measurements: Height, weight, and BMI. Blood pressure. Lipid and cholesterol levels. These may be checked every 5 years, or more frequently if you are over 54 years old. Skin check. Lung cancer screening. You may have this screening every year starting at age 34 if you have a 30-pack-year history of smoking and currently smoke or have quit within the past 15 years. Fecal occult blood test (FOBT) of the stool. You may have this test every year starting at age 68. Flexible sigmoidoscopy or colonoscopy. You may have a sigmoidoscopy every 5 years or a colonoscopy every 10 years starting at age 33. Hepatitis C blood test. Hepatitis B blood test. Sexually transmitted disease (STD) testing. Diabetes screening. This is done by checking your blood sugar (glucose) after you have not eaten for a while (fasting). You may have this done every 1-3 years. Bone density scan. This is done to screen for osteoporosis. You may have this done starting at age 18. Mammogram. This may be done every 1-2 years. Talk to your health care provider about how often you should have regular mammograms. Talk with your health care provider about your test results, treatment options, and if necessary, the need for more tests. Vaccines  Your health care provider may recommend certain vaccines, such as: Influenza vaccine. This is recommended every year. Tetanus, diphtheria, and  acellular pertussis (Tdap, Td) vaccine. You may need a Td  booster every 10 years. Zoster vaccine. You may need this after age 12. Pneumococcal 13-valent conjugate (PCV13) vaccine. One dose is recommended after age 19. Pneumococcal polysaccharide (PPSV23) vaccine. One dose is recommended after age 3. Talk to your health care provider about which screenings and vaccines you need and how often you need them. This information is not intended to replace advice given to you by your health care provider. Make sure you discuss any questions you have with your health care provider. Document Released: 03/17/2015 Document Revised: 11/08/2015 Document Reviewed: 12/20/2014 Elsevier Interactive Patient Education  2017 [place] Prevention in the Home Falls can cause injuries. They can happen to people of all ages. There are many things you can do to make your home safe and to help prevent falls. What can I do on the outside of my home? Regularly fix the edges of walkways and driveways and fix any cracks. Remove anything that might make you trip as you walk through a door, such as a raised step or threshold. Trim any bushes or trees on the path to your home. Use bright outdoor lighting. Clear any walking paths of anything that might make someone trip, such as rocks or tools. Regularly check to see if handrails are loose or broken. Make sure that both sides of any steps have handrails. Any raised decks and porches should have guardrails on the edges. Have any leaves, snow, or ice cleared regularly. Use sand or salt on walking paths during winter. Clean up any spills in your garage right away. This includes oil or grease spills. What can I do in the bathroom? Use night lights. Install grab bars by the toilet and in the tub and shower. Do not use towel bars as grab bars. Use non-skid mats or decals in the tub or shower. If you need to sit down in the shower, use a plastic, non-slip stool. Keep the floor dry. Clean up any water that spills on the floor  as soon as it happens. Remove soap buildup in the tub or shower regularly. Attach bath mats securely with double-sided non-slip rug tape. Do not have throw rugs and other things on the floor that can make you trip. What can I do in the bedroom? Use night lights. Make sure that you have a light by your bed that is easy to reach. Do not use any sheets or blankets that are too big for your bed. They should not hang down onto the floor. Have a firm chair that has side arms. You can use this for support while you get dressed. Do not have throw rugs and other things on the floor that can make you trip. What can I do in the kitchen? Clean up any spills right away. Avoid walking on wet floors. Keep items that you use a lot in easy-to-reach places. If you need to reach something above you, use a strong step stool that has a grab bar. Keep electrical cords out of the way. Do not use floor polish or wax that makes floors slippery. If you must use wax, use non-skid floor wax. Do not have throw rugs and other things on the floor that can make you trip. What can I do with my stairs? Do not leave any items on the stairs. Make sure that there are handrails on both sides of the stairs and use them. Fix handrails that are broken or loose. Make sure that  handrails are as long as the stairways. Check any carpeting to make sure that it is firmly attached to the stairs. Fix any carpet that is loose or worn. Avoid having throw rugs at the top or bottom of the stairs. If you do have throw rugs, attach them to the floor with carpet tape. Make sure that you have a light switch at the top of the stairs and the bottom of the stairs. If you do not have them, ask someone to add them for you. What else can I do to help prevent falls? Wear shoes that: Do not have high heels. Have rubber bottoms. Are comfortable and fit you well. Are closed at the toe. Do not wear sandals. If you use a stepladder: Make sure that it is  fully opened. Do not climb a closed stepladder. Make sure that both sides of the stepladder are locked into place. Ask someone to hold it for you, if possible. Clearly mark and make sure that you can see: Any grab bars or handrails. First and last steps. Where the edge of each step is. Use tools that help you move around (mobility aids) if they are needed. These include: Canes. Walkers. Scooters. Crutches. Turn on the lights when you go into a dark area. Replace any light bulbs as soon as they burn out. Set up your furniture so you have a clear path. Avoid moving your furniture around. If any of your floors are uneven, fix them. If there are any pets around you, be aware of where they are. Review your medicines with your doctor. Some medicines can make you feel dizzy. This can increase your chance of falling. Ask your doctor what other things that you can do to help prevent falls. This information is not intended to replace advice given to you by your health care provider. Make sure you discuss any questions you have with your health care provider. Document Released: 12/15/2008 Document Revised: 07/27/2015 Document Reviewed: 03/25/2014 Elsevier Interactive Patient Education  2017 [employer].

## 2021-03-08 ENCOUNTER — Ambulatory Visit: Payer: Medicare PPO | Admitting: Nurse Practitioner

## 2021-04-17 ENCOUNTER — Ambulatory Visit: Payer: Medicare PPO | Admitting: Nurse Practitioner

## 2021-09-14 ENCOUNTER — Telehealth: Payer: Self-pay

## 2021-09-14 NOTE — Telephone Encounter (Signed)
Patient has not been seen in over 6 months, daughter [name] reports her health has declined and hard for patient to get arroung.   Can she do a virtual visit for assessment or does she need an in person visit ?   Daughter's phone number [phone].

## 2021-09-14 NOTE — Telephone Encounter (Signed)
Pts daughter called stating that her Mother is having trouble bathing and getting to the restroom. She spoke with insurance and they will cover PT through [facility] [phone]. Insurance says referral can be placed as urgent  Please advise.

## 2021-09-17 NOTE — Telephone Encounter (Signed)
Patient is scheduled for 7/19

## 2021-09-19 ENCOUNTER — Encounter (HOSPITAL_BASED_OUTPATIENT_CLINIC_OR_DEPARTMENT_OTHER): Payer: Self-pay | Admitting: Emergency Medicine

## 2021-09-19 ENCOUNTER — Ambulatory Visit: Payer: Medicare PPO | Admitting: Family

## 2021-09-19 ENCOUNTER — Inpatient Hospital Stay (HOSPITAL_BASED_OUTPATIENT_CLINIC_OR_DEPARTMENT_OTHER)
Admission: EM | Admit: 2021-09-19 | Discharge: 2021-09-24 | DRG: 379 | Disposition: A | Discharge status: Skilled Nursing Facility | Payer: Medicare PPO | Attending: Internal Medicine | Admitting: Internal Medicine

## 2021-09-19 ENCOUNTER — Telehealth: Payer: Medicare PPO | Admitting: Family

## 2021-09-19 ENCOUNTER — Other Ambulatory Visit: Payer: Self-pay

## 2021-09-19 ENCOUNTER — Emergency Department (HOSPITAL_BASED_OUTPATIENT_CLINIC_OR_DEPARTMENT_OTHER): Payer: Medicare PPO

## 2021-09-19 VITALS — BP 68/45 | HR 80 | Temp 97.7°F | Resp 16

## 2021-09-19 DIAGNOSIS — K3189 Other diseases of stomach and duodenum: Secondary | ICD-10-CM | POA: Diagnosis not present

## 2021-09-19 DIAGNOSIS — R42 Dizziness and giddiness: Secondary | ICD-10-CM | POA: Diagnosis not present

## 2021-09-19 DIAGNOSIS — E860 Dehydration: Secondary | ICD-10-CM | POA: Diagnosis present

## 2021-09-19 DIAGNOSIS — Z91040 Latex allergy status: Secondary | ICD-10-CM

## 2021-09-19 DIAGNOSIS — N1831 Chronic kidney disease, stage 3a: Secondary | ICD-10-CM | POA: Diagnosis present

## 2021-09-19 DIAGNOSIS — K922 Gastrointestinal hemorrhage, unspecified: Secondary | ICD-10-CM | POA: Diagnosis present

## 2021-09-19 DIAGNOSIS — K2091 Esophagitis, unspecified with bleeding: Secondary | ICD-10-CM | POA: Diagnosis present

## 2021-09-19 DIAGNOSIS — Z8719 Personal history of other diseases of the digestive system: Secondary | ICD-10-CM | POA: Diagnosis present

## 2021-09-19 DIAGNOSIS — Z79818 Long term (current) use of other agents affecting estrogen receptors and estrogen levels: Secondary | ICD-10-CM

## 2021-09-19 DIAGNOSIS — Z8249 Family history of ischemic heart disease and other diseases of the circulatory system: Secondary | ICD-10-CM | POA: Diagnosis not present

## 2021-09-19 DIAGNOSIS — Z87891 Personal history of nicotine dependence: Secondary | ICD-10-CM

## 2021-09-19 DIAGNOSIS — E871 Hypo-osmolality and hyponatremia: Secondary | ICD-10-CM | POA: Diagnosis present

## 2021-09-19 DIAGNOSIS — D509 Iron deficiency anemia, unspecified: Secondary | ICD-10-CM | POA: Diagnosis present

## 2021-09-19 DIAGNOSIS — R195 Other fecal abnormalities: Secondary | ICD-10-CM | POA: Diagnosis not present

## 2021-09-19 DIAGNOSIS — R531 Weakness: Secondary | ICD-10-CM

## 2021-09-19 DIAGNOSIS — R5381 Other malaise: Secondary | ICD-10-CM | POA: Diagnosis present

## 2021-09-19 DIAGNOSIS — F329 Major depressive disorder, single episode, unspecified: Secondary | ICD-10-CM | POA: Diagnosis not present

## 2021-09-19 DIAGNOSIS — K449 Diaphragmatic hernia without obstruction or gangrene: Secondary | ICD-10-CM | POA: Diagnosis present

## 2021-09-19 DIAGNOSIS — K2971 Gastritis, unspecified, with bleeding: Principal | ICD-10-CM | POA: Diagnosis present

## 2021-09-19 DIAGNOSIS — K2981 Duodenitis with bleeding: Secondary | ICD-10-CM | POA: Diagnosis present

## 2021-09-19 DIAGNOSIS — K2980 Duodenitis without bleeding: Secondary | ICD-10-CM | POA: Diagnosis not present

## 2021-09-19 DIAGNOSIS — I129 Hypertensive chronic kidney disease with stage 1 through stage 4 chronic kidney disease, or unspecified chronic kidney disease: Secondary | ICD-10-CM | POA: Diagnosis present

## 2021-09-19 DIAGNOSIS — K222 Esophageal obstruction: Secondary | ICD-10-CM | POA: Diagnosis not present

## 2021-09-19 DIAGNOSIS — E785 Hyperlipidemia, unspecified: Secondary | ICD-10-CM | POA: Diagnosis present

## 2021-09-19 DIAGNOSIS — K319 Disease of stomach and duodenum, unspecified: Secondary | ICD-10-CM | POA: Diagnosis not present

## 2021-09-19 DIAGNOSIS — Z888 Allergy status to other drugs, medicaments and biological substances status: Secondary | ICD-10-CM | POA: Diagnosis not present

## 2021-09-19 DIAGNOSIS — I959 Hypotension, unspecified: Secondary | ICD-10-CM | POA: Insufficient documentation

## 2021-09-19 DIAGNOSIS — D649 Anemia, unspecified: Secondary | ICD-10-CM | POA: Diagnosis present

## 2021-09-19 DIAGNOSIS — R5383 Other fatigue: Secondary | ICD-10-CM | POA: Diagnosis not present

## 2021-09-19 DIAGNOSIS — K2970 Gastritis, unspecified, without bleeding: Secondary | ICD-10-CM | POA: Diagnosis not present

## 2021-09-19 DIAGNOSIS — E876 Hypokalemia: Secondary | ICD-10-CM | POA: Diagnosis present

## 2021-09-19 DIAGNOSIS — F32A Depression, unspecified: Secondary | ICD-10-CM | POA: Diagnosis present

## 2021-09-19 DIAGNOSIS — Z79899 Other long term (current) drug therapy: Secondary | ICD-10-CM

## 2021-09-19 DIAGNOSIS — K2990 Gastroduodenitis, unspecified, without bleeding: Secondary | ICD-10-CM | POA: Diagnosis not present

## 2021-09-19 DIAGNOSIS — I10 Essential (primary) hypertension: Secondary | ICD-10-CM | POA: Diagnosis not present

## 2021-09-19 LAB — CBC WITH DIFFERENTIAL/PLATELET
Abs Immature Granulocytes: 0.04 10*3/uL (ref 0.00–0.07)
Basophils Absolute: 0 10*3/uL (ref 0.0–0.1)
Basophils Relative: 1 %
Eosinophils Absolute: 0.1 10*3/uL (ref 0.0–0.5)
Eosinophils Relative: 1 %
HCT: 31.1 % — ABNORMAL LOW (ref 36.0–46.0)
Hemoglobin: 10.5 g/dL — ABNORMAL LOW (ref 12.0–15.0)
Immature Granulocytes: 1 %
Lymphocytes Relative: 30 %
Lymphs Abs: 2.2 10*3/uL (ref 0.7–4.0)
MCH: 34.9 pg — ABNORMAL HIGH (ref 26.0–34.0)
MCHC: 33.8 g/dL (ref 30.0–36.0)
MCV: 103.3 fL — ABNORMAL HIGH (ref 80.0–100.0)
Monocytes Absolute: 0.7 10*3/uL (ref 0.1–1.0)
Monocytes Relative: 10 %
Neutro Abs: 4.3 10*3/uL (ref 1.7–7.7)
Neutrophils Relative %: 57 %
Platelets: 351 10*3/uL (ref 150–400)
RBC: 3.01 MIL/uL — ABNORMAL LOW (ref 3.87–5.11)
RDW: 16.7 % — ABNORMAL HIGH (ref 11.5–15.5)
WBC: 7.3 10*3/uL (ref 4.0–10.5)
nRBC: 0 % (ref 0.0–0.2)

## 2021-09-19 LAB — OCCULT BLOOD X 1 CARD TO LAB, STOOL: Fecal Occult Bld: POSITIVE — AB

## 2021-09-19 LAB — CBC
HCT: 25.8 % — ABNORMAL LOW (ref 36.0–46.0)
Hemoglobin: 8.7 g/dL — ABNORMAL LOW (ref 12.0–15.0)
MCH: 34.9 pg — ABNORMAL HIGH (ref 26.0–34.0)
MCHC: 33.7 g/dL (ref 30.0–36.0)
MCV: 103.6 fL — ABNORMAL HIGH (ref 80.0–100.0)
Platelets: 305 10*3/uL (ref 150–400)
RBC: 2.49 MIL/uL — ABNORMAL LOW (ref 3.87–5.11)
RDW: 16.9 % — ABNORMAL HIGH (ref 11.5–15.5)
WBC: 8 10*3/uL (ref 4.0–10.5)
nRBC: 0 % (ref 0.0–0.2)

## 2021-09-19 LAB — COMPREHENSIVE METABOLIC PANEL
ALT: 6 U/L (ref 0–44)
AST: 21 U/L (ref 15–41)
Albumin: 2.7 g/dL — ABNORMAL LOW (ref 3.5–5.0)
Alkaline Phosphatase: 35 U/L — ABNORMAL LOW (ref 38–126)
Anion gap: 14 (ref 5–15)
BUN: 11 mg/dL (ref 8–23)
CO2: 30 mmol/L (ref 22–32)
Calcium: 8.2 mg/dL — ABNORMAL LOW (ref 8.9–10.3)
Chloride: 89 mmol/L — ABNORMAL LOW (ref 98–111)
Creatinine, Ser: 1.2 mg/dL — ABNORMAL HIGH (ref 0.44–1.00)
GFR, Estimated: 48 mL/min — ABNORMAL LOW (ref >60)
Glucose, Bld: 168 mg/dL — ABNORMAL HIGH (ref 70–99)
Potassium: <2 mmol/L — CL (ref 3.5–5.1)
Sodium: 133 mmol/L — ABNORMAL LOW (ref 135–145)
Total Bilirubin: 0.9 mg/dL (ref 0.3–1.2)
Total Protein: 6.1 g/dL — ABNORMAL LOW (ref 6.5–8.1)

## 2021-09-19 LAB — LACTIC ACID, PLASMA
Lactic Acid, Venous: 1.7 mmol/L (ref 0.5–1.9)
Lactic Acid, Venous: 1.5 mmol/L (ref 0.5–1.9)

## 2021-09-19 LAB — URINALYSIS, MICROSCOPIC (REFLEX)

## 2021-09-19 LAB — D-DIMER, QUANTITATIVE: D-Dimer, Quant: 0.53 ug/mL-FEU — ABNORMAL HIGH (ref 0.00–0.50)

## 2021-09-19 LAB — MAGNESIUM
Magnesium: 1.1 mg/dL — ABNORMAL LOW (ref 1.7–2.4)
Magnesium: 1.9 mg/dL (ref 1.7–2.4)

## 2021-09-19 LAB — URINALYSIS, ROUTINE W REFLEX MICROSCOPIC
Bilirubin Urine: NEGATIVE
Glucose, UA: NEGATIVE mg/dL
Ketones, ur: NEGATIVE mg/dL
Nitrite: NEGATIVE
Protein, ur: NEGATIVE mg/dL
Specific Gravity, Urine: 1.01 (ref 1.005–1.030)
pH: 6 (ref 5.0–8.0)

## 2021-09-19 LAB — TSH: TSH: 3.328 u[IU]/mL (ref 0.350–4.500)

## 2021-09-19 LAB — TROPONIN I (HIGH SENSITIVITY)
Troponin I (High Sensitivity): 12 ng/L (ref <18)
Troponin I (High Sensitivity): 11 ng/L (ref <18)

## 2021-09-19 MED ORDER — OXYCODONE-ACETAMINOPHEN 5-325 MG PO TABS
1.0000 | ORAL_TABLET | Freq: Once | ORAL | Status: AC
  Administered 2021-09-19: 1 via ORAL
  Filled 2021-09-19: qty 1

## 2021-09-19 MED ORDER — PANTOPRAZOLE SODIUM 40 MG IV SOLR
INTRAVENOUS | Status: AC
  Filled 2021-09-19: qty 40

## 2021-09-19 MED ORDER — MAGNESIUM SULFATE 2 GM/50ML IV SOLN
2.0000 g | Freq: Once | INTRAVENOUS | Status: AC
Start: 2021-09-19 — End: 2021-09-19
  Administered 2021-09-19: 2 g via INTRAVENOUS
  Filled 2021-09-19: qty 50

## 2021-09-19 MED ORDER — PANTOPRAZOLE INFUSION (NEW) - SIMPLE MED
8.0000 mg/h | INTRAVENOUS | Status: DC
  Administered 2021-09-19 – 2021-09-21 (×5): 8 mg/h via INTRAVENOUS
  Filled 2021-09-19: qty 80
  Filled 2021-09-19 (×3): qty 100
  Filled 2021-09-19: qty 80

## 2021-09-19 MED ORDER — ACETAMINOPHEN 650 MG RE SUPP: 650.0000 mg | Freq: Four times a day (QID) | RECTAL | Status: DC | PRN

## 2021-09-19 MED ORDER — PANTOPRAZOLE SODIUM 40 MG IV SOLR: 40.0000 mg | Freq: Two times a day (BID) | INTRAVENOUS | Status: DC

## 2021-09-19 MED ORDER — POTASSIUM CHLORIDE CRYS ER 20 MEQ PO TBCR
40.0000 meq | EXTENDED_RELEASE_TABLET | Freq: Once | ORAL | Status: AC
  Administered 2021-09-19: 40 meq via ORAL
  Filled 2021-09-19: qty 2

## 2021-09-19 MED ORDER — ONDANSETRON HCL 4 MG/2ML IJ SOLN: 4.0000 mg | Freq: Four times a day (QID) | INTRAMUSCULAR | Status: DC | PRN

## 2021-09-19 MED ORDER — ONDANSETRON HCL 4 MG PO TABS: 4.0000 mg | ORAL_TABLET | Freq: Four times a day (QID) | ORAL | Status: DC | PRN

## 2021-09-19 MED ORDER — LACTATED RINGERS IV BOLUS
1000.0000 mL | Freq: Once | INTRAVENOUS | Status: AC
  Administered 2021-09-19: 1000 mL via INTRAVENOUS

## 2021-09-19 MED ORDER — POTASSIUM CHLORIDE 20 MEQ PO PACK
40.0000 meq | PACK | Freq: Once | ORAL | Status: AC
  Administered 2021-09-20: 40 meq via ORAL
  Filled 2021-09-19: qty 2

## 2021-09-19 MED ORDER — LACTATED RINGERS IV SOLN: INTRAVENOUS | Status: DC

## 2021-09-19 MED ORDER — POTASSIUM CHLORIDE 10 MEQ/100ML IV SOLN
10.0000 meq | INTRAVENOUS | Status: AC
  Administered 2021-09-19 (×2): 10 meq via INTRAVENOUS
  Filled 2021-09-19 (×2): qty 100

## 2021-09-19 MED ORDER — PANTOPRAZOLE 80MG IVPB - SIMPLE MED
80.0000 mg | Freq: Once | INTRAVENOUS | Status: AC
  Administered 2021-09-19: 80 mg via INTRAVENOUS
  Filled 2021-09-19 (×2): qty 100

## 2021-09-19 MED ORDER — ACETAMINOPHEN 325 MG PO TABS
650.0000 mg | ORAL_TABLET | Freq: Four times a day (QID) | ORAL | Status: DC | PRN
  Filled 2021-09-19 (×2): qty 2

## 2021-09-19 MED ORDER — SODIUM CHLORIDE 0.9% FLUSH
3.0000 mL | Freq: Two times a day (BID) | INTRAVENOUS | Status: DC
  Administered 2021-09-22 – 2021-09-23 (×4): 3 mL via INTRAVENOUS

## 2021-09-19 NOTE — Significant Event (Signed)
ER physician notified me that patient had another hypotensive episode which responded to fluids.  Repeat blood work showed drop in hemoglobin and now it is around 8.7.  Stool for occult blood is positive.  ER physician started patient on Protonix infusion.  Patient usually follows with Dr. Mann/Dr. Benson Norway. ER physician has sent a message to them.  Patient will need further work-up for GI bleed.  Gean Birchwood

## 2021-09-19 NOTE — ED Triage Notes (Signed)
Pt reports feeling lightheaded today. Michela Pitcher it started today. Has not eaten anything today. Has had decreased energy for past 2 months otherwise

## 2021-09-19 NOTE — Assessment & Plan Note (Addendum)
Hemoglobin dropped from 10.5-8.7 while holding in the ED.  FOBT was positive.  Patient has not seen any frank bleeding other than spots from her hemorrhoid and has not seen any melena.  Colonoscopy 06/16/2019 showed diverticulosis with multiple polyps.  Became hypotensive in the ED, responding to IV fluids at this point. Pt underwent EGD on 09/21/2021. This has demonstrated gastritis and duodenitis. She was restarted on a diet. She has been continued on protonix as recommended by Dr. Benson Norway. Will continue to monitor hemoglobin.  Hemoglobin is stable at 10.2. Continue to monitor.

## 2021-09-19 NOTE — ED Notes (Signed)
Spoke with [name] in lab to get correct K+, reports that k+ is < 2.0. Updated EDP [place]

## 2021-09-19 NOTE — Assessment & Plan Note (Addendum)
Potentially from GI bleeding but also in setting of poor oral intake and continued antihypertensive use. -Continue IV fluids as above -Hold home Toprol-XL and Micardis. The patient is now normotensive without home antihypertensives. Will restart them as warranted.

## 2021-09-19 NOTE — Assessment & Plan Note (Addendum)
Resolved. Monitor.  

## 2021-09-19 NOTE — ED Notes (Signed)
Pt hit elbow on bedside table, small skin tear noted, bacitracin and bandaid applied

## 2021-09-19 NOTE — ED Notes (Signed)
ED Provider at bedside. 

## 2021-09-19 NOTE — ED Notes (Signed)
[facility] at bedside. Report given to [place], Therapist, sports. Pt stable for transport.

## 2021-09-19 NOTE — Assessment & Plan Note (Signed)
Grief since the passing of her husband last September now with significant depression symptoms over the last 3 and half months.

## 2021-09-19 NOTE — Assessment & Plan Note (Addendum)
Renal function stable, continue to monitor. 

## 2021-09-19 NOTE — ED Provider Notes (Addendum)
[facility] EMERGENCY DEPARTMENT Provider Note   CSN: [id number] Arrival date & time: 09/19/21  1157     History  Chief Complaint  Patient presents with   Dizziness    [name] is a 76 y.o. female.  Patient is a 76 year old female with a history of cervical dysplasia and endometrial hyperplasia status post oophorectomy and abdominal hysterectomy, hypertension, hyperlipidemia who is presenting today from her PCP office for hypotension.  Patient presents with her daughter and reports she was seeing the doctor today for the last 2 to 3 months she has had gradual generalized decline.  She reports her husband died approximately 1 year ago after being together for 35 years.  She has felt increasingly depressed with poor appetite and has not been eating or drinking much.  She reports being compliant with her medications most of the time but states that now she is unable to even walk very far with a walker when she starts feeling lightheaded and her legs give out and she falls to the floor.  Her daughter who is currently with her lives in [place] and visited her at Mother's Day and noticed that she was needing to use a cane because she was starting to have some weakness but in the last few months things have really gone downhill.  She came back to visit and reports she has had 3 falls in this past week has had very poor oral intake and has significant incontinence of both urine and stool.  She reports when she stands up and starts walking urine just starts running down her legs.  She is not having any abdominal pain or discomfort in her back.  There is no pain that runs down her legs.  She has intermittent constipation and diarrhea but reports that not significantly different than her baseline.  She is still taking blood pressure medication and denies any syncope but has reported minor injuries to her head.  Patient does not take any anticoagulation.  The history is provided by the  patient and a relative.  Dizziness      Home Medications Prior to Admission medications   Medication Sig Start Date End Date Taking? Authorizing Provider  acyclovir (ZOVIRAX) 200 MG capsule Take 1 capsule (200 mg total) by mouth 5 (five) times daily. 06/08/19   Huel Cote, NP  Calcium Carbonate-Vitamin D (CALCIUM + D PO) Take 2 tablets by mouth daily.     [provider]  estradiol (ESTRACE) 0.5 MG tablet Take 1/2 tab po daily. 01/09/21   Marny Lowenstein A, NP  KRILL OIL PO Take 1 tablet by mouth daily.    [provider]  metoprolol succinate (TOPROL-XL) 100 MG 24 hr tablet Take 1 tablet (100 mg total) by mouth daily. Take with or immediately following a meal. 12/22/20   Debbrah Alar, NP  Multiple Vitamin (MULTIVITAMIN) tablet Take 1 tablet by mouth daily.    [provider]  telmisartan-hydrochlorothiazide (MICARDIS HCT) 80-25 MG tablet Take 1 tablet by mouth daily. 12/22/20   Debbrah Alar, NP  vitamin C (ASCORBIC ACID) 500 MG tablet Take 500 mg by mouth daily.    [provider]  Zoster Vaccine Adjuvanted [facility]) injection Inject 0.'5mg'$  IM now and again in 2-6 months. Patient not taking: Reported on 02/09/2021 06/21/20   Debbrah Alar, NP      Allergies    Atorvastatin, Fenofibrate, Hydralazine, Potassium-containing compounds, and Latex    Review of Systems   Review of Systems  Neurological:  Positive for dizziness.    Physical Exam Updated Vital Signs BP 101/72   Pulse 67   Temp 97.7 F (36.5 C) (Oral)   Resp 18   LMP 03/04/2002   SpO2 99%  Physical Exam Vitals and nursing note reviewed.  Constitutional:      General: She is not in acute distress.    Appearance: She is well-developed.  HENT:     Head: Normocephalic and atraumatic.     Mouth/Throat:     Mouth: Mucous membranes are dry.  Eyes:     Conjunctiva/sclera: Conjunctivae normal.     Pupils: Pupils are equal, round, and reactive to light.   Cardiovascular:     Rate and Rhythm: Normal rate and regular rhythm.     Heart sounds: No murmur heard. Pulmonary:     Effort: Pulmonary effort is normal. No respiratory distress.     Breath sounds: Normal breath sounds. No wheezing or rales.  Abdominal:     General: There is no distension.     Palpations: Abdomen is soft.     Tenderness: There is no abdominal tenderness. There is no guarding or rebound.  Musculoskeletal:        General: No tenderness. Normal range of motion.     Cervical back: Normal range of motion and neck supple.  Skin:    General: Skin is warm and dry.     Findings: No erythema or rash.  Neurological:     Mental Status: She is alert and oriented to person, place, and time.     Comments: 3-4 out of 5 strength in bilateral lower extremities.  She can raise the legs briefly against gravity but can only raise her legs approximately 2 to 3 inches off the bed before she lowers them back.  5 of 5 strength in bilateral upper extremities.  Sensation is intact.  Psychiatric:        Mood and Affect: Mood is depressed. Affect is tearful.        Behavior: Behavior normal.     ED Results / Procedures / Treatments   Labs (all labs ordered are listed, but only abnormal results are displayed) Labs Reviewed  CBC WITH DIFFERENTIAL/PLATELET - Abnormal; Notable for the following components:      Result Value   RBC 3.01 (*)    Hemoglobin 10.5 (*)    HCT 31.1 (*)    MCV 103.3 (*)    MCH 34.9 (*)    RDW 16.7 (*)    All other components within normal limits  COMPREHENSIVE METABOLIC PANEL - Abnormal; Notable for the following components:   Sodium 133 (*)    Potassium <2.0 (*)    Chloride 89 (*)    Glucose, Bld 168 (*)    Creatinine, Ser 1.20 (*)    Calcium 8.2 (*)    Total Protein 6.1 (*)    Albumin 2.7 (*)    Alkaline Phosphatase 35 (*)    GFR, Estimated 48 (*)    All other components within normal limits  URINALYSIS, ROUTINE W REFLEX MICROSCOPIC - Abnormal; Notable  for the following components:   APPearance CLOUDY (*)    Hgb urine dipstick MODERATE (*)    Leukocytes,Ua SMALL (*)    All other components within normal limits  MAGNESIUM - Abnormal; Notable for the following components:   Magnesium 1.1 (*)    All other components within normal limits  URINALYSIS, MICROSCOPIC (REFLEX) - Abnormal; Notable for the following components:   Bacteria, UA MANY (*)  All other components within normal limits  LACTIC ACID, PLASMA  LACTIC ACID, PLASMA  TSH  TROPONIN I (HIGH SENSITIVITY)  TROPONIN I (HIGH SENSITIVITY)    EKG EKG Interpretation  Date/Time:  Wednesday September 19 2021 13:23:05 EDT Ventricular Rate:  64 PR Interval:  202 QRS Duration: 112 QT Interval:  423 QTC Calculation: 437 R Axis:   -19 Text Interpretation: Sinus rhythm Incomplete right bundle branch block Low voltage, precordial leads Probable anteroseptal infarct, old No significant change since last tracing Confirmed by Blanchie Dessert [phone]) on 09/19/2021 2:04:38 PM  Radiology CT Head Wo Contrast  Result Date: 09/19/2021 CLINICAL DATA:  Lightheaded EXAM: CT HEAD WITHOUT CONTRAST TECHNIQUE: Contiguous axial images were obtained from the base of the skull through the vertex without intravenous contrast. RADIATION DOSE REDUCTION: This exam was performed according to the departmental dose-optimization program which includes automated exposure control, adjustment of the mA and/or kV according to patient size and/or use of iterative reconstruction technique. COMPARISON:  None Available. FINDINGS: Brain: No evidence of acute infarction, hemorrhage, cerebral edema, mass, mass effect, or midline shift. No hydrocephalus or extra-axial fluid collection. Periventricular white matter changes, likely the sequela of chronic small vessel ischemic disease. Vascular: No hyperdense vessel. Skull: Normal. Negative for fracture or focal lesion. Sinuses/Orbits: No acute finding. Other: The mastoid air cells are  well aerated. IMPRESSION: No acute intracranial process. Electronically Signed   By: Merilyn Baba M.D.   On: 09/19/2021 13:17   DG Chest Port 1 View  Result Date: 09/19/2021 CLINICAL DATA:  76 year old female with hypotension and lightheadedness EXAM: PORTABLE CHEST 1 VIEW COMPARISON:  None Available. FINDINGS: No focal consolidation, pleural effusion, or pneumothorax. Normal cardiomediastinal silhouette. Aortic atherosclerotic calcification. IMPRESSION: No active disease. Electronically Signed   By: Placido Sou M.D.   On: 09/19/2021 12:57    Procedures Procedures    Medications Ordered in ED Medications  potassium chloride 10 mEq in 100 mL IVPB (10 mEq Intravenous New Bag/Given 09/19/21 1502)  potassium chloride 10 mEq in 100 mL IVPB (has no administration in time range)  potassium chloride SA (KLOR-CON M) CR tablet 40 mEq (has no administration in time range)  magnesium sulfate IVPB 2 g 50 mL (has no administration in time range)  lactated ringers bolus 1,000 mL (0 mLs Intravenous Stopped 09/19/21 1408)  potassium chloride SA (KLOR-CON M) CR tablet 40 mEq (40 mEq Oral Given 09/19/21 1407)    ED Course/ Medical Decision Making/ A&P                           Medical Decision Making Amount and/or Complexity of Data Reviewed External Data Reviewed: notes.    Details: pcp office Labs: ordered. Decision-making details documented in ED Course. Radiology: ordered and independent interpretation performed. Decision-making details documented in ED Course. ECG/medicine tests: ordered and independent interpretation performed. Decision-making details documented in ED Course.  Risk Prescription drug management. Decision regarding hospitalization.   Pt with multiple medical problems and comorbidities and presenting today with a complaint that caries a high risk for morbidity and mortality.  Here today with gradual decline at home with worsening lower extremity weakness with increased falls,  poor intake.  Some symptoms feel that they are most likely related to depression, is not taking in good p.o.'s and generally has no energy.  However also patient is still taking blood pressure medication and was at her PCP office today and noted to have a blood pressure in the  72C systolic.  This might be the cause of her dizziness when she gets up and tries to walk.  She has no pain in her back or symptoms classic for cauda equina.  She does have notable weakness but feel that it is more diffuse.  Low suspicion for stroke but patient has had multiple falls and reports hitting her head and will ensure no intracranial injury.  She denies any chest pain or shortness of breath.  Concern for possible electrolyte abnormalities, new onset diabetes, UTI, dehydration, overmedication, all exacerbated by increasing depression.  Patient given IV fluids we will continue to monitor of blood pressure.  I independently interpreted patient's EKG today which showed no acute findings.  Labs within normal lactic acid today, CBC with a new mild anemia with hemoglobin of 10.5 from 11 prior, normal white count and platelet count, CMP today with mild hyponatremia of 133 but hypokalemia at less than 2.0, stable renal function with creatinine of 1.22, calcium is mildly low as well at 8.2 and troponin within normal limits.  Imaging are pending.  3:26 PM I have independently visualized and interpreted pt's images today.  With head CT today without intracranial hemorrhage and otherwise normal, chest x-ray without acute findings.  Radiology reports no acute findings.  Findings discussed with the patient and her daughter.  Given patient's significantly low potassium magnesium levels were also sent.  Patient does take Micardis which contains hydrochlorothiazide and an ARB which feel is adding into her hypokalemia as well as poor oral intake.  Patient does have a reported allergy to potassium containing substances reporting it causes flank  burning but discussed with she and her daughter the results and that pack patient needs potassium replacement.  Patient started with runs of potassium and IV fluids.  Blood pressure has improved with IV fluids now 101/72.  Feel that patient requires admission for further care.  Magnesium levels are pending.  3:29 PM Mag 1.1 and replaced with 2g of mag.  CRITICAL CARE Performed by: Mackenzi Krogh Total critical care time: 30 minutes Critical care time was exclusive of separately billable procedures and treating other patients. Critical care was necessary to treat or prevent imminent or life-threatening deterioration. Critical care was time spent personally by me on the following activities: development of treatment plan with patient and/or surrogate as well as nursing, discussions with consultants, evaluation of patient's response to treatment, examination of patient, obtaining history from patient or surrogate, ordering and performing treatments and interventions, ordering and review of laboratory studies, ordering and review of radiographic studies, pulse oximetry and re-evaluation of patient's condition.           Final Clinical Impression(s) / ED Diagnoses Final diagnoses:  Dehydration  Hypokalemia  Weakness  Depression, unspecified depression type    Rx / DC Orders ED Discharge Orders     None         Blanchie Dessert, MD 09/19/21 1421    Blanchie Dessert, MD 09/19/21 [phone]

## 2021-09-19 NOTE — Assessment & Plan Note (Addendum)
New. Suspect related to poor PO intake in the setting of severe depression.  She has had a major decline in her functional status these last few months and is having trouble with ADL's at home.  She reports a foul smelling urine as well, but was unable to provide a urine specimen. Pt became extremely weak and pre-syncopal during her visit. She was lifted onto a stretcher and brought to the ED on the first floor.  Report was given to PA working with Dr. Maryan Rued covering provider in the ED.  She will need IV hydration, further medical work up and PT evaluation.  Will likely need SNF at discharge.

## 2021-09-19 NOTE — ED Notes (Signed)
Daughter, [name], provided with update via phone. All questions/concerns addressed at this time.

## 2021-09-19 NOTE — ED Notes (Signed)
Pt BIB [facility] from [facility].  Pt has deceased spouse that has caused some depression. Pt has had decreased PO intake for last few months. Pt is dehydrated, has low potassium, low magnesium. Pt is hypotensive with a hemoglobin of 8.7, a drop from 10.5. Pt had a positive occult stool. Pt has had dark urine.

## 2021-09-19 NOTE — H&P (Signed)
History and Physical    [name]:[id number] DOB: 1946-01-06 DOA: 09/19/2021  PCP: Karla Alar, NP  Patient coming from: [facility]  I have personally briefly reviewed patient's old medical records in [place]  Chief Complaint: Hypotension  HPI: [name] is a 76 y.o. female with medical history significant for HTN, CKD stage IIIa who presented to the ED for evaluation of hypotension.  Patient sent from the ED from her PCP for evaluation of hypotension.  Patient reports significant depression in the last 3 and half months worsened since the passing of her husband in September 2022.  She has had anhedonia, decreased activity, lethargy, poor oral intake.  She has continued to take her antihypertensives.  She has a history of internal hemorrhoids and reports recent spotting bleeding on toilet paper when wiping but no significant bleeding otherwise.  Has not seen any dark black tarry stool.  Denies any abdominal pain.  Reports taking aspirin once in a while but none recently.  [facility] ED Course  Labs/Imaging on admission: I have personally reviewed following labs and imaging studies.  Initial vitals showed BP 92/59, pulse 66, RR 17, temp 97.7 F, SPO2 100% on room air.  Labs showed sodium 133, potassium <2.0, magnesium 1.1, bicarb 30, BUN 11, creatinine 1.20, serum glucose 168, AST 21, ALT 6, alk phos 35, total bilirubin 0.9, hemoglobin 10.5, platelets 351,000.  Troponin negative x2.  TSH 3.328.  Portable chest x-ray negative for focal consolidation, edema, effusion.  CT head without contrast negative for acute intracranial process.  Patient was given 2 L LR, 2 g IV magnesium, IV K 10 mEq x 4, oral K40 mill equivalents x2.  Repeat labs show potassium 2.8, magnesium 1.9.  While in the ED patient became hypotensive.  Repeat hemoglobin was 8.7.  FOBT was positive.  EDP notified patient's GI team, Dr. Collene Mares and [place].  Patient was  transferred ED to ED due to concern she may need blood transfusion which is not available at [facility].  The hospitalist service was consulted to admit for further evaluation and management.  Review of Systems: All systems reviewed and are negative except as documented in history of present illness above.   Past Medical History:  Diagnosis Date   Cervical dysplasia    Endometrial hyperplasia    Fibroid    Atypical leiomyoma   History of colon polyps    Hyperglycemia 05/23/2014   Hyperglycemia    Hypertension     Past Surgical History:  Procedure Laterality Date   ABDOMINAL HYSTERECTOMY  2004   TAH_BSO   BREAST BIOPSY Left 2000   BREAST BIOPSY Bilateral 1999   BREAST EXCISIONAL BIOPSY Left    benign   BREAST SURGERY  1998   Benign left breast lump   COLPOSCOPY     EXCISION MASS LOWER EXTREMETIES Right 05/30/2016   Procedure: EXCISION RIGHT THIGH MASS;  Surgeon: Erroll Luna, MD;  Location: [facility];  Service: General;  Laterality: Right;  EXCISION RIGHT THIGH MASS   HEMORRHOID SURGERY  1980's   LYMPH NODE BIOPSY Left 1975   ? "removed lymph nodes in left leg"--benign per pt   [place]    Social History:  reports that she quit smoking about 10 years ago. Her smoking use included cigarettes. She has a 7.50 pack-year smoking history. She has never used smokeless tobacco. She reports current alcohol use. She reports  that she does not use drugs.  Allergies  Allergen Reactions   Atorvastatin     Muscle cramps   Fenofibrate     "Locked her muscles up"   Hydralazine     "kidney pain"   Latex Rash    Or just redness     Family History  Problem Relation Age of Onset   Hypertension Mother    Heart disease Mother    Diabetes Mother    Breast cancer Mother 24   Heart disease Father    Cancer Father        prostate   Cancer Sister 33       Leukemia, deceased   Breast cancer Maternal Aunt        over 71   Breast cancer  Maternal Grandmother        over 50   Hypertension Brother    Diabetes Brother    Stroke Brother      Prior to Admission medications   Medication Sig Start Date End Date Taking? Authorizing Provider  Calcium Carbonate-Vitamin D (CALCIUM + D PO) Take 2 tablets by mouth daily.    Yes [provider]  estradiol (ESTRACE) 0.5 MG tablet Take 1/2 tab po daily. Patient taking differently: Take 0.25 mg by mouth daily. Take 1/2 tab po daily. 01/09/21  Yes Wallace, Tiffany A, NP  KRILL OIL PO Take 1 tablet by mouth daily.   Yes [provider]  metoprolol succinate (TOPROL-XL) 100 MG 24 hr tablet Take 1 tablet (100 mg total) by mouth daily. Take with or immediately following a meal. 12/22/20  Yes Karla Alar, NP  Multiple Vitamin (MULTIVITAMIN) tablet Take 1 tablet by mouth daily.   Yes [provider]  telmisartan-hydrochlorothiazide (MICARDIS HCT) 80-25 MG tablet Take 1 tablet by mouth daily. 12/22/20  Yes Karla Alar, NP  vitamin C (ASCORBIC ACID) 500 MG tablet Take 500 mg by mouth daily.   Yes [provider]  acyclovir (ZOVIRAX) 200 MG capsule Take 1 capsule (200 mg total) by mouth 5 (five) times daily. Patient not taking: Reported on 09/19/2021 06/08/19   Huel Cote, NP  Zoster Vaccine Adjuvanted [facility]) injection Inject 0.51m IM now and again in 2-6 months. Patient not taking: Reported on 02/09/2021 06/21/20   ODebbrah Alar NP    Physical Exam: Vitals:   09/19/21 2130 09/19/21 2145 09/19/21 2200 09/19/21 2332  BP: (!) 88/69 99/72 90/77  98/71  Pulse: 61   63  Resp: 12 12 15 15   Temp:      TempSrc:      SpO2: 95%   100%   Constitutional: Resting supine in bed, NAD, calm, comfortable Eyes: EOMI, lids and conjunctivae normal ENMT: Mucous membranes are dry. Posterior pharynx clear of any exudate or lesions.Normal dentition.  Neck: normal, supple, no masses. Respiratory: clear to auscultation bilaterally, no wheezing, no  crackles. Normal respiratory effort. No accessory muscle use.  Cardiovascular: Regular rate and rhythm, no murmurs / rubs / gallops. No extremity edema. 2+ pedal pulses. Abdomen: no tenderness, no masses palpated. No hepatosplenomegaly. Bowel sounds positive.  Musculoskeletal: no clubbing / cyanosis. No joint deformity upper and lower extremities. Good ROM, no contractures. Normal muscle tone.  Skin: no rashes, lesions, ulcers. No induration Neurologic: Sensation intact. Strength 5/5 in all 4.  Psychiatric: Alert and oriented x 3.  Depressed mood.   EKG: Personally reviewed. Sinus rhythm, rate 64, incomplete RBBB, low voltage similar to prior.  Assessment/Plan Principal Problem:   GI bleed Active  Problems:   Hypotension   Hypokalemia   Chronic kidney disease, stage 3a ([place])   Depression   [name] is a 76 y.o. female with medical history significant for HTN, CKD stage IIIa who is admitted with hypotension with suspected GI bleeding and severe hypokalemia.  Assessment and Plan: * GI bleed Hemoglobin dropped from 10.5-8.7 while holding in the ED.  FOBT was positive.  Patient has not seen any frank bleeding other than spots from her hemorrhoid and has not seen any melena.  Colonoscopy 06/16/2019 showed diverticulosis with multiple polyps.  Became hypotensive in the ED, responding to IV fluids at this point. -Continue IV Protonix infusion -Continue IV fluid hydration -Follow H&H -Type and screen -Transfuse PRBC for any recurrent hypotension or worsening hemoglobin; patient consented to blood transfusion on admission -EDP notified patient's GI team, Dr. Adriana Mccallum  Hypotension Potentially from GI bleeding but also in setting of poor oral intake and continued antihypertensive use. -Continue IV fluids as above -Hold home Toprol-XL and Micardis  Hypokalemia Severe hypokalemia on ED arrival, improving with initial supplementation to 2.8.  Give additional oral supplement and repeat  labs in AM.  Depression Grief since the passing of her husband last September now with significant depression symptoms over the last 3 and half months.  Chronic kidney disease, stage 3a ([place]) Renal function relatively stable, continue to monitor.  DVT prophylaxis: SCDs Start: 09/19/21 2354 Code Status: Full code Family Communication: Discussed with patient, she has discussed with family Disposition Plan: From home, dispo pending clinical progress Consults called: EDP notified GI Dr Mann/Hung team. Severity of Illness: The appropriate patient status for this patient is OBSERVATION. Observation status is judged to be reasonable and necessary in order to provide the required intensity of service to ensure the patient's safety. The patient's presenting symptoms, physical exam findings, and initial radiographic and laboratory data in the context of their medical condition is felt to place them at decreased risk for further clinical deterioration. Furthermore, it is anticipated that the patient will be medically stable for discharge from the hospital within 2 midnights of admission.   Zada Finders MD [facility]  If 7PM-7AM, please contact night-coverage [email or web address]  09/20/2021, 12:03 AM

## 2021-09-19 NOTE — Progress Notes (Signed)
Subjective:   By signing my name below, I, Shehryar Baig, attest that this documentation has been prepared under the direction and in the presence of Debbrah Alar, NP. 09/19/2021    Patient ID: [name], female    DOB: [birth year], 76 y.o.   MRN: [id number]  Chief Complaint  Patient presents with   Weakness    Patient reports weakness, "unable to walk, shower or care for self"    Weakness Associated symptoms include weakness (bilateral legs).   Patient is in today for a office visit. She is brought today by her daughter who is in town from [place] checking on her Mom.   Depression- She complains of losing motivating to cook, clean, or move since her husband passed away 12/05/20. Right after her husband passed she reports her condition was better and she thinks it was due to being busy following his passing but now that she has more free time her symptoms are worsening.  Weakness in legs- Her daughter reports she has weakness in her legs which is causing her to fall when attempting to walk. She uses a walker full time but she still has unsteadiness. She has difficulty bathing and reaching the bathroom in time to urinate. Her daughter is planning on hiring a helper to assist with bathing and cleaning. Her daughter is requesting home health physical therapy or nursing to help her symptoms and assist with daily tasks.  Urinary- She has urinary incontinence. Her daughter reports she frequently has urine leak. Her daughter also reports her urine has a strong odor. When she does urinate she only urinates a small amount. She has no prior history of urinary incontinence. She denies having any back pain, urinary burning.  Blood pressure- Her blood pressure is low during this visit.  BP Readings from Last 3 Encounters:  09/19/21 106/82  09/19/21 (!) 68/45  12/22/20 129/83   Pulse Readings from Last 3 Encounters:  09/19/21 64  09/19/21 80  12/22/20 75   Diabetes- She is interested in  checking her blood sugar during her next lab work. Her daughter reports she has a family medical history of diabetes.    Health Maintenance Due  Topic Date Due   Zoster Vaccines- Shingrix (1 of 2) Never done   COVID-19 Vaccine (3 - Pfizer series) 07/28/2019   Pneumonia Vaccine 24+ Years old (2 - PCV) 11/20/2019   MAMMOGRAM  07/20/2021    Past Medical History:  Diagnosis Date   Cervical dysplasia    Endometrial hyperplasia    Fibroid    Atypical leiomyoma   History of colon polyps    Hyperglycemia 05/23/2014   Hyperglycemia    Hypertension     Past Surgical History:  Procedure Laterality Date   ABDOMINAL HYSTERECTOMY  2004   TAH_BSO   BREAST BIOPSY Left 2000   BREAST BIOPSY Bilateral 1999   BREAST EXCISIONAL BIOPSY Left    benign   BREAST SURGERY  1998   Benign left breast lump   COLPOSCOPY     EXCISION MASS LOWER EXTREMETIES Right 05/30/2016   Procedure: EXCISION RIGHT THIGH MASS;  Surgeon: Erroll Luna, MD;  Location: [facility];  Service: General;  Laterality: Right;  EXCISION RIGHT THIGH MASS   HEMORRHOID SURGERY  1980's   LYMPH NODE BIOPSY Left 1975   ? "removed lymph nodes in left leg"--benign per pt   OOPHORECTOMY     BSO   TONSILLECTOMY  1990    Family History  Problem Relation Age of Onset  Hypertension Mother    Heart disease Mother    Diabetes Mother    Breast cancer Mother 40   Heart disease Father    Cancer Father        prostate   Cancer Sister 30       Leukemia, deceased   Breast cancer Maternal Aunt        over 81   Breast cancer Maternal Grandmother        over 96   Hypertension Brother    Diabetes Brother    Stroke Brother     Social History   Socioeconomic History   Marital status: Widowed    Spouse name: Not on file   Number of children: Not on file   Years of education: Not on file   Highest education level: Not on file  Occupational History   Not on file  Tobacco Use   Smoking status: Former    Packs/day: 0.25    Years:  30.00    Total pack years: 7.50    Types: Cigarettes    Quit date: 2013    Years since quitting: 10.5   Smokeless tobacco: Never  Vaping Use   Vaping Use: Never used  Substance and Sexual Activity   Alcohol use: Yes    Alcohol/week: 0.0 standard drinks of alcohol    Comment: 2-4 glasses of wine on weekends   Drug use: No   Sexual activity: Yes    Birth control/protection: Surgical    Comment: 1st intercourse 76 yo-fewer than 5 partners  Other Topics Concern   Not on file  Social History Narrative   2 yrs of college (business Photographer)   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   Enjoys reading, yard work, cares for her mother during the day, her brother helps during then night, enjoys poker   1 daughter (in [place]) and 2 grand-daughters (In [place]- age 48 and 61)   Social Determinants of Health   Financial Resource Strain: [place]  (02/09/2021)   Overall Financial Resource Strain (CARDIA)    Difficulty of Paying Living Expenses: Not hard at all  Food Insecurity: No Food Insecurity (02/09/2021)   Hunger Vital Sign    Worried About Running Out of Food in the Last Year: Never true    [place] in the Last Year: Never true  Transportation Needs: No Transportation Needs (02/09/2021)   PRAPARE - Hydrologist (Medical): No    Lack of Transportation (Non-Medical): No  Physical Activity: Insufficiently Active (02/09/2021)   Exercise Vital Sign    Days of Exercise per Week: 1 day    Minutes of Exercise per Session: 30 min  Stress: No Stress Concern Present (02/09/2021)   [place]    Feeling of Stress : Not at all  Social Connections: Moderately Isolated (02/09/2021)   Social Connection and Isolation Panel [NHANES]    Frequency of Communication with Friends and Family: More than three times a week    Frequency of  Social Gatherings with Friends and Family: More than three times a week    Attends Religious Services: Never    Marine scientist or Organizations: Yes    Attends Music therapist: More than 4 times per year    Marital Status: Widowed  Intimate Partner Violence: Not At Risk (  02/09/2021)   Humiliation, Afraid, Rape, and Kick questionnaire    Fear of Current or Ex-Partner: No    Emotionally Abused: No    Physically Abused: No    Sexually Abused: No    Outpatient Medications Prior to Visit  Medication Sig Dispense Refill   acyclovir (ZOVIRAX) 200 MG capsule Take 1 capsule (200 mg total) by mouth 5 (five) times daily. 30 capsule 6   Calcium Carbonate-Vitamin D (CALCIUM + D PO) Take 2 tablets by mouth daily.      estradiol (ESTRACE) 0.5 MG tablet Take 1/2 tab po daily. 45 tablet 0   KRILL OIL PO Take 1 tablet by mouth daily.     metoprolol succinate (TOPROL-XL) 100 MG 24 hr tablet Take 1 tablet (100 mg total) by mouth daily. Take with or immediately following a meal. 90 tablet 1   Multiple Vitamin (MULTIVITAMIN) tablet Take 1 tablet by mouth daily.     telmisartan-hydrochlorothiazide (MICARDIS HCT) 80-25 MG tablet Take 1 tablet by mouth daily. 90 tablet 1   vitamin C (ASCORBIC ACID) 500 MG tablet Take 500 mg by mouth daily.     Zoster Vaccine Adjuvanted [facility]) injection Inject 0.'5mg'$  IM now and again in 2-6 months. (Patient not taking: Reported on 02/09/2021) 0.5 mL 1   No facility-administered medications prior to visit.    Allergies  Allergen Reactions   Atorvastatin     Muscle cramps   Fenofibrate     "Locked her muscles up"   Hydralazine     "kidney pain"   Potassium-Containing Compounds Other (See Comments)    Burning  sensation in kidney  area   Latex Rash    Or just redness     Review of Systems  Genitourinary:  Negative for dysuria.       (+)urinary incontinence (+) strong urine odor  Musculoskeletal:  Negative for back pain.  Neurological:   Positive for weakness (bilateral legs).  Psychiatric/Behavioral:  Positive for depression.        Objective:    Physical Exam Constitutional:      General: She is not in acute distress.    Appearance: Normal appearance. She is underweight. She is ill-appearing.  HENT:     Head: Normocephalic and atraumatic.     Right Ear: External ear normal.     Left Ear: External ear normal.  Eyes:     Extraocular Movements: Extraocular movements intact.     Pupils: Pupils are equal, round, and reactive to light.  Cardiovascular:     Rate and Rhythm: Normal rate and regular rhythm.     Heart sounds: Normal heart sounds. No murmur heard.    No gallop.     Comments: Her blood pressure measured around 60/40 during manual recheck. Pulse was soft and hard to hear.  Blood pressure measured 46/26 during recheck with machine.  Pulmonary:     Effort: Pulmonary effort is normal. No respiratory distress.     Breath sounds: Normal breath sounds. No wheezing or rales.  Abdominal:     General: Bowel sounds are normal. There is no distension.     Palpations: Abdomen is soft. There is no mass.     Tenderness: There is no abdominal tenderness. There is no guarding.  Skin:    General: Skin is warm and dry.  Neurological:     Mental Status: She is oriented to person, place, and time. She is lethargic.  Psychiatric:        Behavior: Behavior is cooperative.  Judgment: Judgment normal.     BP (!) 68/45 (BP Location: Left Arm, Patient Position: Sitting, Cuff Size: Small) Comment: 60/40  Pulse 80   Temp 97.7 F (36.5 C) (Oral)   Resp 16   LMP 03/04/2002   SpO2 99%  Wt Readings from Last 3 Encounters:  02/09/21 147 lb (66.7 kg)  12/22/20 147 lb (66.7 kg)  06/21/20 152 lb (68.9 kg)       Assessment & Plan:   Problem List Items Addressed This Visit       Unprioritized   Hypotension - Primary    New. Suspect related to poor PO intake in the setting of severe depression.  She has had a major  decline in her functional status these last few months and is having trouble with ADL's at home.  She reports a foul smelling urine as well, but was unable to provide a urine specimen. Pt became extremely weak and pre-syncopal during her visit. She was lifted onto a stretcher and brought to the ED on the first floor.  Report was given to PA working with Dr. Maryan Rued covering provider in the ED.  She will need IV hydration, further medical work up and PT evaluation.  Will likely need SNF at discharge.       40 minutes spent on today's visit.  Time was spent interviewing patient and daughter, examining patient and coordinating transfer to the ED.    No orders of the defined types were placed in this encounter.   I, Nance Pear, NP, personally preformed the services described in this documentation.  All medical record entries made by the scribe were at my direction and in my presence.  I have reviewed the chart and discharge instructions (if applicable) and agree that the record reflects my personal performance and is accurate and complete. 09/19/2021   I,Shehryar Baig,acting as a Education administrator for Nance Pear, NP.,have documented all relevant documentation on the behalf of Nance Pear, NP,as directed by  Nance Pear, NP while in the presence of Nance Pear, NP.   Nance Pear, NP

## 2021-09-19 NOTE — ED Provider Notes (Addendum)
Patient was awaiting admission for hypokalemia, dehydration.  When I was notified that her blood pressure had dropped into the 96K systolic again.  Per my review of her labs it appeared that she did not have any cardiac issue going on or infectious process going on.  I did notice that she had a 3 g hemoglobin drop from several years back.  Repeat CBC, BMP was ordered.  Hemoglobin has dropped from 10.5-8.7.  Overall I have suspicion for possible GI bleed.  Repeat BMP has shown improvement with potassium of 2.8.  She states that she has had a colonoscopy in the past and found to have some polyps.  She has had hemorrhoids as well.  She would not allow me to do a rectal exam but I was able to get her to agree to rectal exam with my PA.  Hemoccult has been sent.  I have started her on IV Protonix bolus and infusion.  Blood pressure improved with changing out her cuff to smaller size.  I have given her an additional fluid bolus.  Right now she is hemodynamically stable.  We will talk with GI and update medicine team.  Overall I have started patient on IV Protonix bolus and infusion.  I have updated the medicine team of patient's medical status with Dr. Hal Hope.  I have messaged Dr. Adriana Mccallum team with whom patient follows outpatient for.  Hemoccult was positive.  Iron panel has been sent.  D-dimer is age-adjusted normal.  Overall suspect GI bleed as a cause of her symptoms.  To be admitted to medicine.  Hemodynamically stable.  Blood pressure has responded well to IV fluids with blood pressure 101/74.  On reevaluation patient still having some intermittent soft blood pressures in the upper 80s, low 90s.  Still with a map above 65.  I talked with Dr. Laverta Baltimore at [facility] long ED as patient inpatient bed is still not available.  Since we do not have blood products here I will send her ED to ED as I do believe she benefit from a blood transfusion/have that ability to get blood products if needed emergently.  This chart  was dictated using voice recognition software.  Despite best efforts to proofread,  errors can occur which can change the documentation meaning.    Lennice Sites, DO 09/19/21 2029    Lennice Sites, DO 09/19/21 2138

## 2021-09-19 NOTE — Plan of Care (Signed)
76 year old F with PMH of cervical dysplasia and endometrial hyperplasia s/p THO, HTN on Micardis and HLD directed to ED by PCP for hypotension.  Reportedly depressed for about a year since he has parents died.  Had lightheadedness, poor p.o. intake, urinary incontinence and BLE weakness.  In ED, vitals stable except for soft BP that has improved with IV fluid.  Not tachycardic.  Afebrile. Na 133.  K <2.0. Cr 1.20.  Lactic acid 1.7.  Hgb 10.5.  MCV 103.  Troponin negative.  EKG sinus rhythm with RBBB (new).  Mg pending.  Received p.o. KCl 40 and LR bolus 1 L..  Not on IV KCl.  Advised to give additional p.o. KCl 40.  Hospitalist service called for admission for severe hypokalemia and generalized weakness.  Accepted to telemetry either at [facility] or [facility] for observation.

## 2021-09-20 ENCOUNTER — Encounter (HOSPITAL_COMMUNITY): Payer: Self-pay | Admitting: Internal Medicine

## 2021-09-20 DIAGNOSIS — N1831 Chronic kidney disease, stage 3a: Secondary | ICD-10-CM

## 2021-09-20 DIAGNOSIS — K449 Diaphragmatic hernia without obstruction or gangrene: Secondary | ICD-10-CM | POA: Diagnosis present

## 2021-09-20 DIAGNOSIS — I129 Hypertensive chronic kidney disease with stage 1 through stage 4 chronic kidney disease, or unspecified chronic kidney disease: Secondary | ICD-10-CM | POA: Diagnosis present

## 2021-09-20 DIAGNOSIS — D509 Iron deficiency anemia, unspecified: Secondary | ICD-10-CM | POA: Diagnosis present

## 2021-09-20 DIAGNOSIS — Z8249 Family history of ischemic heart disease and other diseases of the circulatory system: Secondary | ICD-10-CM | POA: Diagnosis not present

## 2021-09-20 DIAGNOSIS — R5381 Other malaise: Secondary | ICD-10-CM | POA: Diagnosis present

## 2021-09-20 DIAGNOSIS — E860 Dehydration: Secondary | ICD-10-CM | POA: Diagnosis present

## 2021-09-20 DIAGNOSIS — D649 Anemia, unspecified: Secondary | ICD-10-CM | POA: Diagnosis present

## 2021-09-20 DIAGNOSIS — Z91040 Latex allergy status: Secondary | ICD-10-CM | POA: Diagnosis not present

## 2021-09-20 DIAGNOSIS — K2981 Duodenitis with bleeding: Secondary | ICD-10-CM | POA: Diagnosis present

## 2021-09-20 DIAGNOSIS — E871 Hypo-osmolality and hyponatremia: Secondary | ICD-10-CM | POA: Diagnosis present

## 2021-09-20 DIAGNOSIS — F329 Major depressive disorder, single episode, unspecified: Secondary | ICD-10-CM | POA: Diagnosis not present

## 2021-09-20 DIAGNOSIS — K2980 Duodenitis without bleeding: Secondary | ICD-10-CM | POA: Diagnosis not present

## 2021-09-20 DIAGNOSIS — Z888 Allergy status to other drugs, medicaments and biological substances status: Secondary | ICD-10-CM | POA: Diagnosis not present

## 2021-09-20 DIAGNOSIS — E785 Hyperlipidemia, unspecified: Secondary | ICD-10-CM | POA: Diagnosis present

## 2021-09-20 DIAGNOSIS — K2970 Gastritis, unspecified, without bleeding: Secondary | ICD-10-CM | POA: Diagnosis not present

## 2021-09-20 DIAGNOSIS — F32A Depression, unspecified: Secondary | ICD-10-CM | POA: Diagnosis present

## 2021-09-20 DIAGNOSIS — I959 Hypotension, unspecified: Secondary | ICD-10-CM

## 2021-09-20 DIAGNOSIS — K222 Esophageal obstruction: Secondary | ICD-10-CM | POA: Diagnosis not present

## 2021-09-20 DIAGNOSIS — Z79899 Other long term (current) drug therapy: Secondary | ICD-10-CM | POA: Diagnosis not present

## 2021-09-20 DIAGNOSIS — K922 Gastrointestinal hemorrhage, unspecified: Secondary | ICD-10-CM | POA: Diagnosis not present

## 2021-09-20 DIAGNOSIS — Z79818 Long term (current) use of other agents affecting estrogen receptors and estrogen levels: Secondary | ICD-10-CM | POA: Diagnosis not present

## 2021-09-20 DIAGNOSIS — K2971 Gastritis, unspecified, with bleeding: Secondary | ICD-10-CM | POA: Diagnosis present

## 2021-09-20 DIAGNOSIS — Z87891 Personal history of nicotine dependence: Secondary | ICD-10-CM | POA: Diagnosis not present

## 2021-09-20 DIAGNOSIS — K2091 Esophagitis, unspecified with bleeding: Secondary | ICD-10-CM | POA: Diagnosis present

## 2021-09-20 DIAGNOSIS — E876 Hypokalemia: Secondary | ICD-10-CM

## 2021-09-20 LAB — COMPREHENSIVE METABOLIC PANEL
ALT: 5 U/L (ref 0–44)
AST: 14 U/L — ABNORMAL LOW (ref 15–41)
Albumin: 2 g/dL — ABNORMAL LOW (ref 3.5–5.0)
Alkaline Phosphatase: 26 U/L — ABNORMAL LOW (ref 38–126)
Anion gap: 8 (ref 5–15)
BUN: 9 mg/dL (ref 8–23)
CO2: 29 mmol/L (ref 22–32)
Calcium: 7.9 mg/dL — ABNORMAL LOW (ref 8.9–10.3)
Chloride: 106 mmol/L (ref 98–111)
Creatinine, Ser: 0.98 mg/dL (ref 0.44–1.00)
GFR, Estimated: >60 mL/min (ref >60)
Glucose, Bld: 109 mg/dL — ABNORMAL HIGH (ref 70–99)
Potassium: 4.3 mmol/L (ref 3.5–5.1)
Sodium: 143 mmol/L (ref 135–145)
Total Bilirubin: 0.5 mg/dL (ref 0.3–1.2)
Total Protein: 4.7 g/dL — ABNORMAL LOW (ref 6.5–8.1)

## 2021-09-20 LAB — CBC
HCT: 26.1 % — ABNORMAL LOW (ref 36.0–46.0)
HCT: 31.2 % — ABNORMAL LOW (ref 36.0–46.0)
Hemoglobin: 8.6 g/dL — ABNORMAL LOW (ref 12.0–15.0)
Hemoglobin: 10.4 g/dL — ABNORMAL LOW (ref 12.0–15.0)
MCH: 35.5 pg — ABNORMAL HIGH (ref 26.0–34.0)
MCH: 33.9 pg (ref 26.0–34.0)
MCHC: 33 g/dL (ref 30.0–36.0)
MCHC: 33.3 g/dL (ref 30.0–36.0)
MCV: 107.9 fL — ABNORMAL HIGH (ref 80.0–100.0)
MCV: 101.6 fL — ABNORMAL HIGH (ref 80.0–100.0)
Platelets: 323 10*3/uL (ref 150–400)
Platelets: 341 10*3/uL (ref 150–400)
RBC: 2.42 MIL/uL — ABNORMAL LOW (ref 3.87–5.11)
RBC: 3.07 MIL/uL — ABNORMAL LOW (ref 3.87–5.11)
RDW: 17.1 % — ABNORMAL HIGH (ref 11.5–15.5)
RDW: 21.6 % — ABNORMAL HIGH (ref 11.5–15.5)
WBC: 4.6 10*3/uL (ref 4.0–10.5)
WBC: 6.2 10*3/uL (ref 4.0–10.5)
nRBC: 0 % (ref 0.0–0.2)
nRBC: 0 % (ref 0.0–0.2)

## 2021-09-20 LAB — IRON AND TIBC
Iron: 53 ug/dL (ref 28–170)
Saturation Ratios: 30 % (ref 10.4–31.8)
TIBC: 179 ug/dL — ABNORMAL LOW (ref 250–450)
UIBC: 126 ug/dL

## 2021-09-20 LAB — BASIC METABOLIC PANEL
Anion gap: 7 (ref 5–15)
BUN: 11 mg/dL (ref 8–23)
CO2: 31 mmol/L (ref 22–32)
Calcium: 7.6 mg/dL — ABNORMAL LOW (ref 8.9–10.3)
Chloride: 96 mmol/L — ABNORMAL LOW (ref 98–111)
Creatinine, Ser: 1.25 mg/dL — ABNORMAL HIGH (ref 0.44–1.00)
GFR, Estimated: 46 mL/min — ABNORMAL LOW (ref >60)
Glucose, Bld: 167 mg/dL — ABNORMAL HIGH (ref 70–99)
Potassium: 2.8 mmol/L — ABNORMAL LOW (ref 3.5–5.1)
Sodium: 134 mmol/L — ABNORMAL LOW (ref 135–145)

## 2021-09-20 LAB — VITAMIN B12: Vitamin B-12: 331 pg/mL (ref 180–914)

## 2021-09-20 LAB — HEMOGLOBIN AND HEMATOCRIT, BLOOD
HCT: 31.3 % — ABNORMAL LOW (ref 36.0–46.0)
HCT: 32.9 % — ABNORMAL LOW (ref 36.0–46.0)
Hemoglobin: 10.4 g/dL — ABNORMAL LOW (ref 12.0–15.0)
Hemoglobin: 10.8 g/dL — ABNORMAL LOW (ref 12.0–15.0)

## 2021-09-20 LAB — GLUCOSE, CAPILLARY: Glucose-Capillary: 107 mg/dL — ABNORMAL HIGH (ref 70–99)

## 2021-09-20 LAB — FOLATE: Folate: 3.8 ng/mL — ABNORMAL LOW (ref >5.9)

## 2021-09-20 LAB — MAGNESIUM: Magnesium: 1.6 mg/dL — ABNORMAL LOW (ref 1.7–2.4)

## 2021-09-20 LAB — RETICULOCYTES
Immature Retic Fract: 33.6 % — ABNORMAL HIGH (ref 2.3–15.9)
RBC.: 2.46 MIL/uL — ABNORMAL LOW (ref 3.87–5.11)
Retic Count, Absolute: 58.3 10*3/uL (ref 19.0–186.0)
Retic Ct Pct: 2.4 % (ref 0.4–3.1)

## 2021-09-20 LAB — PREPARE RBC (CROSSMATCH)

## 2021-09-20 LAB — FERRITIN: Ferritin: 664 ng/mL — ABNORMAL HIGH (ref 11–307)

## 2021-09-20 LAB — ABO/RH: ABO/RH(D): A POS

## 2021-09-20 MED ORDER — SODIUM CHLORIDE 0.9% IV SOLUTION: Freq: Once | INTRAVENOUS | Status: AC

## 2021-09-20 MED ORDER — HYDROCODONE-ACETAMINOPHEN 5-325 MG PO TABS
1.0000 | ORAL_TABLET | Freq: Four times a day (QID) | ORAL | Status: DC | PRN
  Administered 2021-09-20 – 2021-09-24 (×11): 1 via ORAL
  Filled 2021-09-20 (×11): qty 1

## 2021-09-20 MED ORDER — BOOST / RESOURCE BREEZE PO LIQD CUSTOM: 1.0000 | Freq: Three times a day (TID) | ORAL | Status: DC

## 2021-09-20 MED ORDER — LACTATED RINGERS IV BOLUS
500.0000 mL | Freq: Once | INTRAVENOUS | Status: AC
  Administered 2021-09-20: 500 mL via INTRAVENOUS

## 2021-09-20 MED ORDER — POTASSIUM CHLORIDE 10 MEQ/100ML IV SOLN
10.0000 meq | INTRAVENOUS | Status: AC
  Administered 2021-09-20 (×6): 10 meq via INTRAVENOUS
  Filled 2021-09-20 (×6): qty 100

## 2021-09-20 MED ORDER — ORAL CARE MOUTH RINSE: 15.0000 mL | OROMUCOSAL | Status: DC | PRN

## 2021-09-20 NOTE — ED Notes (Signed)
MD paged about patients K

## 2021-09-20 NOTE — H&P (View-Only) (Signed)
Reason for Consult: Anemia, heme positive stool, hypotension Referring Physician: [facility]  [name] HPI: This is a 76 year old female with a PMH of CKD, HTN, and colonic polyps (last colonoscopy was 06/2019 with findings of SSA and TA) admitted for complaints of hypotension.  She was initially sent to [place] from her PCP's office for hypotension.  While in the ER she was found to be anemic and a hemoccult was positive.  Blood work from 2018 and 2020 show that her HGB was in the 12-13 g/dL range.  On admission, and with IV hydration, her HGB dropped down to the 8 g/dL range.  She does not report any problems with melena, but she did see some spotting on toilet tissue after a bowel movement.  The patient does not report any problems with GERD, abdominal pain, or any use of NSAIDs.  Past Medical History:  Diagnosis Date   Cervical dysplasia    Endometrial hyperplasia    Fibroid    Atypical leiomyoma   History of colon polyps    Hyperglycemia 05/23/2014   Hyperglycemia    Hypertension     Past Surgical History:  Procedure Laterality Date   ABDOMINAL HYSTERECTOMY  2004   TAH_BSO   BREAST BIOPSY Left 2000   BREAST BIOPSY Bilateral 1999   BREAST EXCISIONAL BIOPSY Left    benign   BREAST SURGERY  1998   Benign left breast lump   COLPOSCOPY     EXCISION MASS LOWER EXTREMETIES Right 05/30/2016   Procedure: EXCISION RIGHT THIGH MASS;  Surgeon: Erroll Luna, MD;  Location: [facility] OR;  Service: General;  Laterality: Right;  EXCISION RIGHT THIGH MASS   HEMORRHOID SURGERY  1980's   LYMPH NODE BIOPSY Left 1975   ? "removed lymph nodes in left leg"--benign per pt   OOPHORECTOMY     BSO   TONSILLECTOMY  1990    Family History  Problem Relation Age of Onset   Hypertension Mother    Heart disease Mother    Diabetes Mother    Breast cancer Mother 62   Heart disease Father    Cancer Father        prostate   Cancer Sister 61       Leukemia, deceased   Breast  cancer Maternal Aunt        over 71   Breast cancer Maternal Grandmother        over 2   Hypertension Brother    Diabetes Brother    Stroke Brother     Social History:  reports that she quit smoking about 10 years ago. Her smoking use included cigarettes. She has a 7.50 pack-year smoking history. She has never used smokeless tobacco. She reports current alcohol use. She reports that she does not use drugs.  Allergies:  Allergies  Allergen Reactions   Atorvastatin     Muscle cramps   Fenofibrate     "Locked her muscles up"   Hydralazine     "kidney pain"   Latex Rash    Or just redness     Medications: Scheduled:  [START ON 09/23/2021] pantoprazole  40 mg Intravenous Q12H   sodium chloride flush  3 mL Intravenous Q12H   Continuous:  lactated ringers 125 mL/hr at 09/20/21 0903   pantoprazole 8 mg/hr (09/20/21 0631)   potassium chloride 10 mEq (09/20/21 1429)    Results for orders placed or performed during the hospital encounter of 09/19/21 (from the past 24 hour(s))  Basic metabolic panel     Status: Abnormal   Collection Time: 09/19/21  6:46 PM  Result Value Ref Range   Sodium 134 (L) 135 - 145 mmol/L   Potassium 2.8 (L) 3.5 - 5.1 mmol/L   Chloride 96 (L) 98 - 111 mmol/L   CO2 31 22 - 32 mmol/L   Glucose, Bld 167 (H) 70 - 99 mg/dL   BUN 11 8 - 23 mg/dL   Creatinine, Ser 1.25 (H) 0.44 - 1.00 mg/dL   Calcium 7.6 (L) 8.9 - 10.3 mg/dL   GFR, Estimated 46 (L) >60 mL/min   Anion gap 7 5 - 15  Magnesium     Status: None   Collection Time: 09/19/21  6:46 PM  Result Value Ref Range   Magnesium 1.9 1.7 - 2.4 mg/dL  CBC     Status: Abnormal   Collection Time: 09/19/21  7:09 PM  Result Value Ref Range   WBC 8.0 4.0 - 10.5 K/uL   RBC 2.49 (L) 3.87 - 5.11 MIL/uL   Hemoglobin 8.7 (L) 12.0 - 15.0 g/dL   HCT 25.8 (L) 36.0 - 46.0 %   MCV 103.6 (H) 80.0 - 100.0 fL   MCH 34.9 (H) 26.0 - 34.0 pg   MCHC 33.7 30.0 - 36.0 g/dL   RDW 16.9 (H) 11.5 - 15.5 %   Platelets 305 150 -  400 K/uL   nRBC 0.0 0.0 - 0.2 %  D-dimer, quantitative     Status: Abnormal   Collection Time: 09/19/21  7:09 PM  Result Value Ref Range   D-Dimer, Quant 0.53 (H) 0.00 - 0.50 ug/mL-FEU  Reticulocytes     Status: None   Collection Time: 09/19/21  7:24 PM  Result Value Ref Range   Retic Ct Pct REORDERED, SEE H1119 0.4 - 3.1 %  Reticulocytes     Status: Abnormal   Collection Time: 09/19/21  7:24 PM  Result Value Ref Range   Retic Ct Pct 2.4 0.4 - 3.1 %   RBC. 2.46 (L) 3.87 - 5.11 MIL/uL   Retic Count, Absolute 58.3 19.0 - 186.0 K/uL   Immature Retic Fract 33.6 (H) 2.3 - 15.9 %  Occult blood card to lab, stool     Status: Abnormal   Collection Time: 09/19/21  7:30 PM  Result Value Ref Range   Fecal Occult Bld POSITIVE (A) NEGATIVE  Vitamin B12     Status: None   Collection Time: 09/19/21  8:00 PM  Result Value Ref Range   Vitamin B-12 331 180 - 914 pg/mL  Folate     Status: Abnormal   Collection Time: 09/19/21  8:00 PM  Result Value Ref Range   Folate 3.8 (L) >5.9 ng/mL  Iron and TIBC     Status: Abnormal   Collection Time: 09/19/21  8:00 PM  Result Value Ref Range   Iron 53 28 - 170 ug/dL   TIBC 179 (L) 250 - 450 ug/dL   Saturation Ratios 30 10.4 - 31.8 %   UIBC 126 ug/dL  Ferritin     Status: Abnormal   Collection Time: 09/19/21  8:00 PM  Result Value Ref Range   Ferritin 664 (H) 11 - 307 ng/mL  Type and screen [place]     Status: None (Preliminary result)   Collection Time: 09/20/21 12:44 AM  Result Value Ref Range   ABO/RH(D) A POS    Antibody Screen NEG    Sample Expiration 09/23/2021,2359    Unit Number  [id number]    Blood Component Type RBC, LR IRR    Unit division 00    Status of Unit ISSUED    Transfusion Status OK TO TRANSFUSE    Crossmatch Result      Compatible Performed at [facility] [address] [place], [place] [address]    Unit Number [id number]    Blood Component Type RBC, LR IRR    Unit  division 00    Status of Unit ALLOCATED    Transfusion Status OK TO TRANSFUSE    Crossmatch Result Compatible    Unit Number [id number]    Blood Component Type RED CELLS,LR    Unit division 00    Status of Unit ALLOCATED    Transfusion Status OK TO TRANSFUSE    Crossmatch Result Compatible   Prepare RBC (crossmatch)     Status: None   Collection Time: 09/20/21 12:44 AM  Result Value Ref Range   Order Confirmation      ORDER PROCESSED BY BLOOD BANK Performed at [facility], [place] [address] [place], [place] [address]   Magnesium     Status: Abnormal   Collection Time: 09/20/21  5:20 AM  Result Value Ref Range   Magnesium 1.6 (L) 1.7 - 2.4 mg/dL  Comprehensive metabolic panel     Status: Abnormal   Collection Time: 09/20/21  5:20 AM  Result Value Ref Range   Sodium 143 135 - 145 mmol/L   Potassium 4.3 3.5 - 5.1 mmol/L   Chloride 106 98 - 111 mmol/L   CO2 29 22 - 32 mmol/L   Glucose, Bld 109 (H) 70 - 99 mg/dL   BUN 9 8 - 23 mg/dL   Creatinine, Ser 0.98 0.44 - 1.00 mg/dL   Calcium 7.9 (L) 8.9 - 10.3 mg/dL   Total Protein 4.7 (L) 6.5 - 8.1 g/dL   Albumin 2.0 (L) 3.5 - 5.0 g/dL   AST 14 (L) 15 - 41 U/L   ALT 5 0 - 44 U/L   Alkaline Phosphatase 26 (L) 38 - 126 U/L   Total Bilirubin 0.5 0.3 - 1.2 mg/dL   GFR, Estimated >60 >60 mL/min   Anion gap 8 5 - 15  CBC     Status: Abnormal   Collection Time: 09/20/21  5:20 AM  Result Value Ref Range   WBC 4.6 4.0 - 10.5 K/uL   RBC 2.42 (L) 3.87 - 5.11 MIL/uL   Hemoglobin 8.6 (L) 12.0 - 15.0 g/dL   HCT 26.1 (L) 36.0 - 46.0 %   MCV 107.9 (H) 80.0 - 100.0 fL   MCH 35.5 (H) 26.0 - 34.0 pg   MCHC 33.0 30.0 - 36.0 g/dL   RDW 17.1 (H) 11.5 - 15.5 %   Platelets 323 150 - 400 K/uL   nRBC 0.0 0.0 - 0.2 %  ABO/Rh     Status: None   Collection Time: 09/20/21  5:20 AM  Result Value Ref Range   ABO/RH(D)      A POS Performed at [facility], [place] [address], [place], [place] [address]      CT Head  Wo Contrast  Result Date: 09/19/2021 CLINICAL DATA:  Lightheaded EXAM: CT HEAD WITHOUT CONTRAST TECHNIQUE: Contiguous axial images were obtained from the base of the skull through the vertex without intravenous contrast. RADIATION DOSE REDUCTION: This exam was performed according to the departmental dose-optimization program which includes automated exposure control, adjustment of the mA and/or kV according to patient  size and/or use of iterative reconstruction technique. COMPARISON:  None Available. FINDINGS: Brain: No evidence of acute infarction, hemorrhage, cerebral edema, mass, mass effect, or midline shift. No hydrocephalus or extra-axial fluid collection. Periventricular white matter changes, likely the sequela of chronic small vessel ischemic disease. Vascular: No hyperdense vessel. Skull: Normal. Negative for fracture or focal lesion. Sinuses/Orbits: No acute finding. Other: The mastoid air cells are well aerated. IMPRESSION: No acute intracranial process. Electronically Signed   By: Merilyn Baba M.D.   On: 09/19/2021 13:17   DG Chest Port 1 View  Result Date: 09/19/2021 CLINICAL DATA:  76 year old female with hypotension and lightheadedness EXAM: PORTABLE CHEST 1 VIEW COMPARISON:  None Available. FINDINGS: No focal consolidation, pleural effusion, or pneumothorax. Normal cardiomediastinal silhouette. Aortic atherosclerotic calcification. IMPRESSION: No active disease. Electronically Signed   By: Placido Sou M.D.   On: 09/19/2021 12:57    ROS:  As stated above in the HPI otherwise negative.  Blood pressure 101/84, pulse 61, temperature (!) 97.5 F (36.4 C), temperature source Oral, resp. rate 15, last menstrual period 03/04/2002, SpO2 96 %.    PE: Gen: NAD, Alert and Oriented HEENT:  [place]/AT, EOMI Neck: Supple, no LAD Lungs: CTA Bilaterally CV: RRR without M/G/R ABD: Soft, NTND, +BS Ext: No C/C/E  Assessment/Plan: 1) Anemia. 2) Heme positive stool. 3) Hypotension.  Plan: 1)  EGD. 2) Follow HGB and transfuse as necessary.  Juventino Pavone D 09/20/2021, 3:10 PM

## 2021-09-20 NOTE — Progress Notes (Signed)
Pt arrived to unit. Vitals stable. Provider came to bedside.

## 2021-09-20 NOTE — Hospital Course (Signed)
[name] is a 76 y.o. female with medical history significant for HTN, CKD stage IIIa who is admitted with hypotension with suspected GI bleeding and severe hypokalemia.

## 2021-09-20 NOTE — Consult Note (Signed)
Reason for Consult: Anemia, heme positive stool, hypotension Referring Physician: [facility]  Cher Bobby Rumpf HPI: This is a 76 year old female with a PMH of CKD, HTN, and colonic polyps (last colonoscopy was 06/2019 with findings of SSA and TA) admitted for complaints of hypotension.  She was initially sent to [place] from her PCP's office for hypotension.  While in the ER she was found to be anemic and a hemoccult was positive.  Blood work from 2018 and 2020 show that her HGB was in the 12-13 g/dL range.  On admission, and with IV hydration, her HGB dropped down to the 8 g/dL range.  She does not report any problems with melena, but she did see some spotting on toilet tissue after a bowel movement.  The patient does not report any problems with GERD, abdominal pain, or any use of NSAIDs.  Past Medical History:  Diagnosis Date   Cervical dysplasia    Endometrial hyperplasia    Fibroid    Atypical leiomyoma   History of colon polyps    Hyperglycemia 05/23/2014   Hyperglycemia    Hypertension     Past Surgical History:  Procedure Laterality Date   ABDOMINAL HYSTERECTOMY  2004   TAH_BSO   BREAST BIOPSY Left 2000   BREAST BIOPSY Bilateral 1999   BREAST EXCISIONAL BIOPSY Left    benign   BREAST SURGERY  1998   Benign left breast lump   COLPOSCOPY     EXCISION MASS LOWER EXTREMETIES Right 05/30/2016   Procedure: EXCISION RIGHT THIGH MASS;  Surgeon: Erroll Luna, MD;  Location: [facility] OR;  Service: General;  Laterality: Right;  EXCISION RIGHT THIGH MASS   HEMORRHOID SURGERY  1980's   LYMPH NODE BIOPSY Left 1975   ? "removed lymph nodes in left leg"--benign per pt   OOPHORECTOMY     BSO   TONSILLECTOMY  1990    Family History  Problem Relation Age of Onset   Hypertension Mother    Heart disease Mother    Diabetes Mother    Breast cancer Mother 15   Heart disease Father    Cancer Father        prostate   Cancer Sister 69       Leukemia, deceased   Breast  cancer Maternal Aunt        over 31   Breast cancer Maternal Grandmother        over 50   Hypertension Brother    Diabetes Brother    Stroke Brother     Social History:  reports that she quit smoking about 10 years ago. Her smoking use included cigarettes. She has a 7.50 pack-year smoking history. She has never used smokeless tobacco. She reports current alcohol use. She reports that she does not use drugs.  Allergies:  Allergies  Allergen Reactions   Atorvastatin     Muscle cramps   Fenofibrate     "Locked her muscles up"   Hydralazine     "kidney pain"   Latex Rash    Or just redness     Medications: Scheduled:  [START ON 09/23/2021] pantoprazole  40 mg Intravenous Q12H   sodium chloride flush  3 mL Intravenous Q12H   Continuous:  lactated ringers 125 mL/hr at 09/20/21 0903   pantoprazole 8 mg/hr (09/20/21 0631)   potassium chloride 10 mEq (09/20/21 1429)    Results for orders placed or performed during the hospital encounter of 09/19/21 (from the past 24 hour(s))  Basic metabolic panel     Status: Abnormal   Collection Time: 09/19/21  6:46 PM  Result Value Ref Range   Sodium 134 (L) 135 - 145 mmol/L   Potassium 2.8 (L) 3.5 - 5.1 mmol/L   Chloride 96 (L) 98 - 111 mmol/L   CO2 31 22 - 32 mmol/L   Glucose, Bld 167 (H) 70 - 99 mg/dL   BUN 11 8 - 23 mg/dL   Creatinine, Ser 1.25 (H) 0.44 - 1.00 mg/dL   Calcium 7.6 (L) 8.9 - 10.3 mg/dL   GFR, Estimated 46 (L) >60 mL/min   Anion gap 7 5 - 15  Magnesium     Status: None   Collection Time: 09/19/21  6:46 PM  Result Value Ref Range   Magnesium 1.9 1.7 - 2.4 mg/dL  CBC     Status: Abnormal   Collection Time: 09/19/21  7:09 PM  Result Value Ref Range   WBC 8.0 4.0 - 10.5 K/uL   RBC 2.49 (L) 3.87 - 5.11 MIL/uL   Hemoglobin 8.7 (L) 12.0 - 15.0 g/dL   HCT 25.8 (L) 36.0 - 46.0 %   MCV 103.6 (H) 80.0 - 100.0 fL   MCH 34.9 (H) 26.0 - 34.0 pg   MCHC 33.7 30.0 - 36.0 g/dL   RDW 16.9 (H) 11.5 - 15.5 %   Platelets 305 150 -  400 K/uL   nRBC 0.0 0.0 - 0.2 %  D-dimer, quantitative     Status: Abnormal   Collection Time: 09/19/21  7:09 PM  Result Value Ref Range   D-Dimer, Quant 0.53 (H) 0.00 - 0.50 ug/mL-FEU  Reticulocytes     Status: None   Collection Time: 09/19/21  7:24 PM  Result Value Ref Range   Retic Ct Pct REORDERED, SEE H1119 0.4 - 3.1 %  Reticulocytes     Status: Abnormal   Collection Time: 09/19/21  7:24 PM  Result Value Ref Range   Retic Ct Pct 2.4 0.4 - 3.1 %   RBC. 2.46 (L) 3.87 - 5.11 MIL/uL   Retic Count, Absolute 58.3 19.0 - 186.0 K/uL   Immature Retic Fract 33.6 (H) 2.3 - 15.9 %  Occult blood card to lab, stool     Status: Abnormal   Collection Time: 09/19/21  7:30 PM  Result Value Ref Range   Fecal Occult Bld POSITIVE (A) NEGATIVE  Vitamin B12     Status: None   Collection Time: 09/19/21  8:00 PM  Result Value Ref Range   Vitamin B-12 331 180 - 914 pg/mL  Folate     Status: Abnormal   Collection Time: 09/19/21  8:00 PM  Result Value Ref Range   Folate 3.8 (L) >5.9 ng/mL  Iron and TIBC     Status: Abnormal   Collection Time: 09/19/21  8:00 PM  Result Value Ref Range   Iron 53 28 - 170 ug/dL   TIBC 179 (L) 250 - 450 ug/dL   Saturation Ratios 30 10.4 - 31.8 %   UIBC 126 ug/dL  Ferritin     Status: Abnormal   Collection Time: 09/19/21  8:00 PM  Result Value Ref Range   Ferritin 664 (H) 11 - 307 ng/mL  Type and screen [place]     Status: None (Preliminary result)   Collection Time: 09/20/21 12:44 AM  Result Value Ref Range   ABO/RH(D) A POS    Antibody Screen NEG    Sample Expiration 09/23/2021,2359    Unit Number  [id number]    Blood Component Type RBC, LR IRR    Unit division 00    Status of Unit ISSUED    Transfusion Status OK TO TRANSFUSE    Crossmatch Result      Compatible Performed at [facility] [address] [place], [place] [address]    Unit Number [id number]    Blood Component Type RBC, LR IRR    Unit  division 00    Status of Unit ALLOCATED    Transfusion Status OK TO TRANSFUSE    Crossmatch Result Compatible    Unit Number [id number]    Blood Component Type RED CELLS,LR    Unit division 00    Status of Unit ALLOCATED    Transfusion Status OK TO TRANSFUSE    Crossmatch Result Compatible   Prepare RBC (crossmatch)     Status: None   Collection Time: 09/20/21 12:44 AM  Result Value Ref Range   Order Confirmation      ORDER PROCESSED BY BLOOD BANK Performed at [facility], [place] [address], [place], [place] [address]   Magnesium     Status: Abnormal   Collection Time: 09/20/21  5:20 AM  Result Value Ref Range   Magnesium 1.6 (L) 1.7 - 2.4 mg/dL  Comprehensive metabolic panel     Status: Abnormal   Collection Time: 09/20/21  5:20 AM  Result Value Ref Range   Sodium 143 135 - 145 mmol/L   Potassium 4.3 3.5 - 5.1 mmol/L   Chloride 106 98 - 111 mmol/L   CO2 29 22 - 32 mmol/L   Glucose, Bld 109 (H) 70 - 99 mg/dL   BUN 9 8 - 23 mg/dL   Creatinine, Ser 0.98 0.44 - 1.00 mg/dL   Calcium 7.9 (L) 8.9 - 10.3 mg/dL   Total Protein 4.7 (L) 6.5 - 8.1 g/dL   Albumin 2.0 (L) 3.5 - 5.0 g/dL   AST 14 (L) 15 - 41 U/L   ALT 5 0 - 44 U/L   Alkaline Phosphatase 26 (L) 38 - 126 U/L   Total Bilirubin 0.5 0.3 - 1.2 mg/dL   GFR, Estimated >60 >60 mL/min   Anion gap 8 5 - 15  CBC     Status: Abnormal   Collection Time: 09/20/21  5:20 AM  Result Value Ref Range   WBC 4.6 4.0 - 10.5 K/uL   RBC 2.42 (L) 3.87 - 5.11 MIL/uL   Hemoglobin 8.6 (L) 12.0 - 15.0 g/dL   HCT 26.1 (L) 36.0 - 46.0 %   MCV 107.9 (H) 80.0 - 100.0 fL   MCH 35.5 (H) 26.0 - 34.0 pg   MCHC 33.0 30.0 - 36.0 g/dL   RDW 17.1 (H) 11.5 - 15.5 %   Platelets 323 150 - 400 K/uL   nRBC 0.0 0.0 - 0.2 %  ABO/Rh     Status: None   Collection Time: 09/20/21  5:20 AM  Result Value Ref Range   ABO/RH(D)      A POS Performed at [facility], [place] [address] [place], [place] [address]      CT Head  Wo Contrast  Result Date: 09/19/2021 CLINICAL DATA:  Lightheaded EXAM: CT HEAD WITHOUT CONTRAST TECHNIQUE: Contiguous axial images were obtained from the base of the skull through the vertex without intravenous contrast. RADIATION DOSE REDUCTION: This exam was performed according to the departmental dose-optimization program which includes automated exposure control, adjustment of the mA and/or kV according to patient  size and/or use of iterative reconstruction technique. COMPARISON:  None Available. FINDINGS: Brain: No evidence of acute infarction, hemorrhage, cerebral edema, mass, mass effect, or midline shift. No hydrocephalus or extra-axial fluid collection. Periventricular white matter changes, likely the sequela of chronic small vessel ischemic disease. Vascular: No hyperdense vessel. Skull: Normal. Negative for fracture or focal lesion. Sinuses/Orbits: No acute finding. Other: The mastoid air cells are well aerated. IMPRESSION: No acute intracranial process. Electronically Signed   By: Merilyn Baba M.D.   On: 09/19/2021 13:17   DG Chest Port 1 View  Result Date: 09/19/2021 CLINICAL DATA:  76 year old female with hypotension and lightheadedness EXAM: PORTABLE CHEST 1 VIEW COMPARISON:  None Available. FINDINGS: No focal consolidation, pleural effusion, or pneumothorax. Normal cardiomediastinal silhouette. Aortic atherosclerotic calcification. IMPRESSION: No active disease. Electronically Signed   By: Placido Sou M.D.   On: 09/19/2021 12:57    ROS:  As stated above in the HPI otherwise negative.  Blood pressure 101/84, pulse 61, temperature (!) 97.5 F (36.4 C), temperature source Oral, resp. rate 15, last menstrual period 03/04/2002, SpO2 96 %.    PE: Gen: NAD, Alert and Oriented HEENT:  [place]/AT, EOMI Neck: Supple, no LAD Lungs: CTA Bilaterally CV: RRR without M/G/R ABD: Soft, NTND, +BS Ext: No C/C/E  Assessment/Plan: 1) Anemia. 2) Heme positive stool. 3) Hypotension.  Plan: 1)  EGD. 2) Follow HGB and transfuse as necessary.  Ceejay Kegley D 09/20/2021, 3:10 PM

## 2021-09-21 ENCOUNTER — Inpatient Hospital Stay (HOSPITAL_COMMUNITY): Payer: Medicare PPO | Admitting: Anesthesiology

## 2021-09-21 ENCOUNTER — Encounter (HOSPITAL_COMMUNITY)
Admission: EM | Disposition: A | Discharge status: Skilled Nursing Facility | Payer: Self-pay | Source: Home / Self Care | Attending: Internal Medicine

## 2021-09-21 ENCOUNTER — Encounter (HOSPITAL_COMMUNITY): Payer: Self-pay | Admitting: Internal Medicine

## 2021-09-21 DIAGNOSIS — K2970 Gastritis, unspecified, without bleeding: Secondary | ICD-10-CM

## 2021-09-21 DIAGNOSIS — N1831 Chronic kidney disease, stage 3a: Secondary | ICD-10-CM | POA: Diagnosis not present

## 2021-09-21 DIAGNOSIS — F329 Major depressive disorder, single episode, unspecified: Secondary | ICD-10-CM | POA: Diagnosis not present

## 2021-09-21 DIAGNOSIS — K449 Diaphragmatic hernia without obstruction or gangrene: Secondary | ICD-10-CM

## 2021-09-21 DIAGNOSIS — K222 Esophageal obstruction: Secondary | ICD-10-CM

## 2021-09-21 DIAGNOSIS — K922 Gastrointestinal hemorrhage, unspecified: Secondary | ICD-10-CM | POA: Diagnosis not present

## 2021-09-21 DIAGNOSIS — E876 Hypokalemia: Secondary | ICD-10-CM | POA: Diagnosis not present

## 2021-09-21 DIAGNOSIS — K2980 Duodenitis without bleeding: Secondary | ICD-10-CM

## 2021-09-21 HISTORY — PX: BIOPSY: SHX5522

## 2021-09-21 HISTORY — PX: ESOPHAGOGASTRODUODENOSCOPY (EGD) WITH PROPOFOL: SHX5813

## 2021-09-21 LAB — BASIC METABOLIC PANEL
Anion gap: 9 (ref 5–15)
BUN: 6 mg/dL — ABNORMAL LOW (ref 8–23)
CO2: 25 mmol/L (ref 22–32)
Calcium: 8.5 mg/dL — ABNORMAL LOW (ref 8.9–10.3)
Chloride: 106 mmol/L (ref 98–111)
Creatinine, Ser: 0.92 mg/dL (ref 0.44–1.00)
GFR, Estimated: >60 mL/min (ref >60)
Glucose, Bld: 99 mg/dL (ref 70–99)
Potassium: 5.1 mmol/L (ref 3.5–5.1)
Sodium: 140 mmol/L (ref 135–145)

## 2021-09-21 LAB — CBC WITH DIFFERENTIAL/PLATELET
Abs Immature Granulocytes: 0.07 10*3/uL (ref 0.00–0.07)
Basophils Absolute: 0 10*3/uL (ref 0.0–0.1)
Basophils Relative: 1 %
Eosinophils Absolute: 0.1 10*3/uL (ref 0.0–0.5)
Eosinophils Relative: 2 %
HCT: 32.9 % — ABNORMAL LOW (ref 36.0–46.0)
Hemoglobin: 11 g/dL — ABNORMAL LOW (ref 12.0–15.0)
Immature Granulocytes: 1 %
Lymphocytes Relative: 40 %
Lymphs Abs: 2.2 10*3/uL (ref 0.7–4.0)
MCH: 34.1 pg — ABNORMAL HIGH (ref 26.0–34.0)
MCHC: 33.4 g/dL (ref 30.0–36.0)
MCV: 101.9 fL — ABNORMAL HIGH (ref 80.0–100.0)
Monocytes Absolute: 0.6 10*3/uL (ref 0.1–1.0)
Monocytes Relative: 10 %
Neutro Abs: 2.6 10*3/uL (ref 1.7–7.7)
Neutrophils Relative %: 46 %
Platelets: 294 10*3/uL (ref 150–400)
RBC: 3.23 MIL/uL — ABNORMAL LOW (ref 3.87–5.11)
RDW: 20.6 % — ABNORMAL HIGH (ref 11.5–15.5)
WBC: 5.6 10*3/uL (ref 4.0–10.5)
nRBC: 0 % (ref 0.0–0.2)

## 2021-09-21 LAB — HEMOGLOBIN AND HEMATOCRIT, BLOOD
HCT: 30.7 % — ABNORMAL LOW (ref 36.0–46.0)
HCT: 33.2 % — ABNORMAL LOW (ref 36.0–46.0)
HCT: 28.7 % — ABNORMAL LOW (ref 36.0–46.0)
Hemoglobin: 10.2 g/dL — ABNORMAL LOW (ref 12.0–15.0)
Hemoglobin: 10.8 g/dL — ABNORMAL LOW (ref 12.0–15.0)
Hemoglobin: 9.5 g/dL — ABNORMAL LOW (ref 12.0–15.0)

## 2021-09-21 SURGERY — ESOPHAGOGASTRODUODENOSCOPY (EGD) WITH PROPOFOL
Anesthesia: Monitor Anesthesia Care

## 2021-09-21 MED ORDER — PROPOFOL 10 MG/ML IV BOLUS
INTRAVENOUS | Status: DC | PRN
  Administered 2021-09-21: 10 mg via INTRAVENOUS
  Administered 2021-09-21: 20 mg via INTRAVENOUS

## 2021-09-21 MED ORDER — ADULT MULTIVITAMIN W/MINERALS CH
1.0000 | ORAL_TABLET | Freq: Every day | ORAL | Status: DC
  Administered 2021-09-22 – 2021-09-24 (×3): 1 via ORAL
  Filled 2021-09-21 (×3): qty 1

## 2021-09-21 MED ORDER — SODIUM CHLORIDE 0.9 % IV SOLN: INTRAVENOUS | Status: DC

## 2021-09-21 MED ORDER — FOLIC ACID 1 MG PO TABS
1.0000 mg | ORAL_TABLET | Freq: Every day | ORAL | Status: DC
  Administered 2021-09-22 – 2021-09-24 (×3): 1 mg via ORAL
  Filled 2021-09-21 (×3): qty 1

## 2021-09-21 MED ORDER — ENSURE ENLIVE PO LIQD
237.0000 mL | Freq: Three times a day (TID) | ORAL | Status: DC
  Administered 2021-09-23 – 2021-09-24 (×2): 237 mL via ORAL

## 2021-09-21 MED ORDER — LIDOCAINE HCL 1 % IJ SOLN
INTRAMUSCULAR | Status: DC | PRN
  Administered 2021-09-21: 40 mg via INTRADERMAL

## 2021-09-21 MED ORDER — PANTOPRAZOLE SODIUM 40 MG PO TBEC
40.0000 mg | DELAYED_RELEASE_TABLET | Freq: Every day | ORAL | Status: DC
Start: 2021-09-21 — End: 2021-09-24
  Administered 2021-09-21 – 2021-09-24 (×4): 40 mg via ORAL
  Filled 2021-09-21 (×4): qty 1

## 2021-09-21 MED ORDER — PROPOFOL 500 MG/50ML IV EMUL
INTRAVENOUS | Status: DC | PRN
  Administered 2021-09-21: 125 ug/kg/min via INTRAVENOUS

## 2021-09-21 MED ORDER — PROPOFOL 500 MG/50ML IV EMUL
INTRAVENOUS | Status: AC
  Filled 2021-09-21: qty 50

## 2021-09-21 SURGICAL SUPPLY — 15 items

## 2021-09-21 NOTE — Op Note (Addendum)
[facility] Patient Name: [name] Procedure Date: 09/21/2021 MRN: [id number] Attending MD: Carol Ada , MD Date of Birth: Mar 17, 1945 CSN: [id number] Age: 76 Admit Type: Outpatient Procedure:                Upper GI endoscopy Indications:              Iron deficiency anemia, Heme positive stool Providers:                Carol Ada, MD, Dulcy Fanny, Despina Pole, Technician Referring MD:              Medicines:                Propofol per Anesthesia Complications:            No immediate complications. Estimated Blood Loss:     Estimated blood loss: none. Procedure:                Pre-Anesthesia Assessment:                           - Prior to the procedure, a History and Physical                            was performed, and patient medications and                            allergies were reviewed. The patient's tolerance of                            previous anesthesia was also reviewed. The risks                            and benefits of the procedure and the sedation                            options and risks were discussed with the patient.                            All questions were answered, and informed consent                            was obtained. Prior Anticoagulants: The patient has                            taken no previous anticoagulant or antiplatelet                            agents. ASA Grade Assessment: III - A patient with                            severe systemic disease. After reviewing the risks  and benefits, the patient was deemed in                            satisfactory condition to undergo the procedure.                           - Sedation was administered by an anesthesia                            professional. Deep sedation was attained.                           After obtaining informed consent, the endoscope was                            passed under  direct vision. Throughout the                            procedure, the patient's blood pressure, pulse, and                            oxygen saturations were monitored continuously. The                            [id number]-H190 ([id number]) Olympus endoscope was introduced                            through the mouth, and advanced to the second part                            of duodenum. The upper GI endoscopy was                            accomplished without difficulty. The patient                            tolerated the procedure well. Scope In: Scope Out: Findings:      A non-obstructing Schatzki ring was found at the gastroesophageal       junction.      A 3 cm hiatal hernia was present.      Segmental mild inflammation characterized by congestion (edema) and       erythema was found in the gastric antrum and in the prepyloric region of       the stomach. Biopsies were taken with a cold forceps for Helicobacter       pylori testing.      Segmental moderate inflammation characterized by erythema was found in       the duodenal bulb.      In the prepyloric region/antrum there was evidence of healed/healing       erosions. There was no evidence of any ulcers or overt bleeding. The       duodenal bulb exhibited erythema and edema, but there was no evidence of       any ulcers. Impression:               - Non-obstructing Schatzki ring.                           -  3 cm hiatal hernia.                           - Gastritis. Biopsied.                           - Duodenitis. Moderate Sedation:      Not Applicable - Patient had care per Anesthesia. Recommendation:           - Return patient to hospital ward for ongoing care.                           - Resume regular diet.                           - Continue present medications.                           - Await pathology results.                           - Continue with PPI QD.                           - If she is stable tomorrow  hemodynamically, and                            HGB is stable, she can be discharged home with a                            PPI.                           - Follow up with Dr. Collene Mares in 1-2 weeks. Procedure Code(s):        --- Professional ---                           [phone], Esophagogastroduodenoscopy, flexible,                            transoral; with biopsy, single or multiple Diagnosis Code(s):        --- Professional ---                           K29.80, Duodenitis without bleeding                           K22.2, Esophageal obstruction                           K44.9, Diaphragmatic hernia without obstruction or                            gangrene                           K29.70, Gastritis, unspecified, without bleeding  D50.9, Iron deficiency anemia, unspecified                           R19.5, Other fecal abnormalities CPT copyright 2019 [employer] Medical Association. All rights reserved. The codes documented in this report are preliminary and upon coder review may  be revised to meet current compliance requirements. Carol Ada, MD Carol Ada, MD 09/21/2021 1:46:55 PM This report has been signed electronically. Number of Addenda: 0

## 2021-09-21 NOTE — Progress Notes (Signed)
PROGRESS NOTE  [name] HYW:[id number] DOB: 1945-11-29 DOA: 09/19/2021 PCP: Debbrah Alar, NP  Brief History   The patient is a 76 yr old woman who presents to ED with complaints of hypotension. Patient reports significant depression in the last 3 and half months worsened since the passing of her husband in September 2022.  She has had anhedonia, decreased activity, lethargy, poor oral intake.  She has continued to take her antihypertensives. The patient was FOBT positive. Hemoglobin upon presentation was 10.5 initially, repeat hemoglobin was 8.7.   The patient was admitted to a telemetry bed. Dr. Benson Norway was consulted for GI.   Consultants  Gastroenterology  Procedures  None  Antibiotics   Anti-infectives (From admission, onward)    None      Subjective  The patient is tearful. She has no new complaints.   Objective   Vitals:  Vitals:   09/21/21 1330 09/21/21 1419  BP: 128/71 (!) 126/92  Pulse: 80 76  Resp: 20 18  Temp:  97.6 F (36.4 C)  SpO2: 97% 99%    Exam:  Constitutional:  Appears calm and comfortable Respiratory:  CTA bilaterally, no w/r/r.  Respiratory effort normal. No retractions or accessory muscle use Cardiovascular:  RRR, no m/r/g No LE extremity edema   Normal pedal pulses Abdomen:  Abdomen appears normal; no tenderness or masses No hernias No HSM Musculoskeletal:  Digits/nails BUE: no clubbing, cyanosis, petechiae, infection exam of joints, bones, muscles of at least one of following: head/neck, RUE, LUE, RLE, LLE   strength and tone normal, no atrophy, no abnormal movements No tenderness, masses Normal ROM, no contractures  gait and station Skin:  No rashes, lesions, ulcers palpation of skin: no induration or nodules Neurologic:  CN 2-12 intact Sensation all 4 extremities intact Psychiatric:  Mental status; Depressed Mood, affect flat Orientation to person, place, time  judgment and insight appear intact     I have  personally reviewed the following:   Today's Data  Vitals  Lab Data  CBC, BMP  Micro Data    Imaging    Cardiology Data    Other Data    Scheduled Meds:  feeding supplement  237 mL Oral TID BM   [START ON 4/85/4627] folic acid  1 mg Oral Daily   [START ON 09/22/2021] multivitamin with minerals  1 tablet Oral Daily   pantoprazole  40 mg Oral Daily   sodium chloride flush  3 mL Intravenous Q12H    Principal Problem:   GI bleed Active Problems:   Hypotension   Hypokalemia   Chronic kidney disease, stage 3a (HCC)   Depression   Symptomatic anemia   LOS: 1 day   A & P  * GI bleed Hemoglobin dropped from 10.5-8.7 while holding in the ED.  FOBT was positive.  Patient has not seen any frank bleeding other than spots from her hemorrhoid and has not seen any melena.  Colonoscopy 06/16/2019 showed diverticulosis with multiple polyps.  Became hypotensive in the ED, responding to IV fluids at this point. -Continue IV Protonix infusion -Continue IV fluid hydration -Follow H&H -Type and screen -Transfuse PRBC for any recurrent hypotension or worsening hemoglobin; patient consented to blood transfusion on admission -EGD is planned for later today.   Hypotension Potentially from GI bleeding but also in setting of poor oral intake and continued antihypertensive use. -Continue IV fluids as above -Hold home Toprol-XL and Micardis - Blood pressures are improved.   Hypokalemia Severe hypokalemia on ED arrival, improving  with initial supplementation to 2.8.  Resolved.   Depression Grief since the passing of her husband last September now with significant depression symptoms over the last 3 and half months.   Chronic kidney disease, stage 3a ([place]) Renal function relatively stable, continue to monitor. Creatinine is improving.   DVT prophylaxis: SCDs Start: 09/19/21 2354 Code Status: Full code Family Communication: Discussed with patient, she has discussed with  family Disposition Plan: From home, dispo pending clinical progress Consults called: EDP notified GI Dr Mann/Hung team.  Karie Kirks, DO [facility] Direct contact: see [email or web address]  7PM-7AM contact night coverage as above 09/20/2021, 12:26 PM  LOS: 1 day

## 2021-09-21 NOTE — Anesthesia Postprocedure Evaluation (Signed)
Anesthesia Post Note  Patient: [name]  Procedure(s) Performed: ESOPHAGOGASTRODUODENOSCOPY (EGD) WITH PROPOFOL BIOPSY     Patient location during evaluation: Endoscopy Anesthesia Type: MAC Level of consciousness: oriented, awake and alert and awake Pain management: pain level controlled Vital Signs Assessment: post-procedure vital signs reviewed and stable Respiratory status: spontaneous breathing, nonlabored ventilation, respiratory function stable and patient connected to nasal cannula oxygen Cardiovascular status: blood pressure returned to baseline and stable Postop Assessment: no headache, no backache and no apparent nausea or vomiting Anesthetic complications: no   No notable events documented.  Last Vitals:  Vitals:   09/21/21 1330 09/21/21 1419  BP: 128/71 (!) 126/92  Pulse: 80 76  Resp: 20 18  Temp:  36.4 C  SpO2: 97% 99%    Last Pain:  Vitals:   09/21/21 1419  TempSrc: Oral  PainSc:                  Santa Lighter

## 2021-09-21 NOTE — TOC Progression Note (Signed)
Transition of Care [facility]) - Progression Note    Patient Details  Name: [name] MRN: [id number] Date of Birth: [birth year]  [facility] of [facility]) CM/SW Contact  Rokhaya Quinn, Juliann Pulse, RN Phone Number: 09/21/2021, 3:55 PM  Clinical Narrative:  Faxed out await bed offers.     Expected Discharge Plan: Skilled Nursing Facility Barriers to Discharge: Continued Medical Work up  Expected Discharge Plan and Services Expected Discharge Plan: [place]                                               Social Determinants of Health (SDOH) Interventions    Readmission Risk Interventions     No data to display

## 2021-09-21 NOTE — Progress Notes (Signed)
Chaplain reviewed paperwork for HCPOA and AD with Areonna and her daughter and provided education.  They plan to fill it out when Lorrain is feeling a bit better since she has a headache at this time.  Chaplain provided instructions for how to get the document notarized outside of the hospital and instructed them to bring a notarized copy to her next medical appointment so that it can be scanned into her chart.  [address], [place] Pager, [phone]

## 2021-09-21 NOTE — NC FL2 (Signed)
[place] LEVEL OF CARE SCREENING TOOL     IDENTIFICATION  Patient Name: [name] Birthdate: May 19, 1945 Sex: female Admission Date (Current Location): 09/19/2021  [facility] and [place] Number:  Herbalist and Address:  [facility],  [place], [place]      Provider Number: [id number]  Attending Physician Name and Address:  Karie Kirks, DO  Relative Name and Phone Number:   [name] smith(dtr) [phone])    Current Level of Care: Hospital Recommended Level of Care: [place] Prior Approval Number:    Date Approved/Denied:   PASRR Number:  [id number])  Discharge Plan: SNF    Current Diagnoses: Patient Active Problem List   Diagnosis Date Noted   Symptomatic anemia 09/20/2021   Hypotension 09/19/2021   Hypokalemia 09/19/2021   GI bleed 09/19/2021   Chronic kidney disease, stage 3a ([place]) 09/19/2021   Depression 09/19/2021   Grief reaction 12/22/2020   Hormone replacement therapy (HRT) 08/31/2018   Preventative health care 09/25/2014   Vitamin D deficiency 05/24/2014   Hyperglycemia 05/23/2014   Hypertriglyceridemia 05/23/2014   History of colon polyps 05/23/2014   Essential hypertension, benign 04/06/2014   Fibroid     Orientation RESPIRATION BLADDER Height & Weight     Self, Time, Situation  Normal Continent Weight: 61.7 kg Height:  '5\' 4"'$  (162.6 cm)  BEHAVIORAL SYMPTOMS/MOOD NEUROLOGICAL BOWEL NUTRITION STATUS      Continent Diet (Regular)  AMBULATORY STATUS COMMUNICATION OF NEEDS Skin   Limited Assist   Other (Comment) (stage 1-duoderm ower sacrum)                       Personal Care Assistance Level of Assistance  Bathing, Feeding, Dressing Bathing Assistance: Limited assistance Feeding assistance: Limited assistance Dressing Assistance: Limited assistance     Functional Limitations Info  Sight, Hearing, Speech Sight Info: Impaired (eyeglasses) Hearing Info:  Adequate Speech Info: Adequate    SPECIAL CARE FACTORS FREQUENCY  PT (By licensed PT), OT (By licensed OT)     PT Frequency:  (5x week) OT Frequency:  (5x week)            Contractures Contractures Info: Not present    Additional Factors Info  Code Status, Allergies Code Status Info:  (Full) Allergies Info:  (Atorvastatin, Fenofibrate, Hydralazine, Latex)           Current Medications (09/21/2021):  This is the current hospital active medication list Current Facility-Administered Medications  Medication Dose Route Frequency Provider Last Rate Last Admin   acetaminophen (TYLENOL) tablet 650 mg  650 mg Oral Q6H PRN Carol Ada, MD       Or   acetaminophen (TYLENOL) suppository 650 mg  650 mg Rectal Q6H PRN Carol Ada, MD       feeding supplement (ENSURE ENLIVE / ENSURE PLUS) liquid 237 mL  237 mL Oral TID BM Swayze, Ava, DO       [START ON 2/67/1245] folic acid (FOLVITE) tablet 1 mg  1 mg Oral Daily Swayze, Ava, DO       HYDROcodone-acetaminophen (NORCO/VICODIN) 5-325 MG per tablet 1 tablet  1 tablet Oral Q6H PRN Carol Ada, MD   1 tablet at 09/21/21 0016   lactated ringers infusion   Intravenous Continuous Carol Ada, MD 125 mL/hr at 09/21/21 1249 Restarted at 09/21/21 1305   [START ON 09/22/2021] multivitamin with minerals tablet 1 tablet  1 tablet Oral Daily Swayze, Ava, DO  ondansetron (ZOFRAN) tablet 4 mg  4 mg Oral Q6H PRN Carol Ada, MD       Or   ondansetron [facility]) injection 4 mg  4 mg Intravenous Q6H PRN Carol Ada, MD       Oral care mouth rinse  15 mL Mouth Rinse PRN Carol Ada, MD       pantoprazole (PROTONIX) EC tablet 40 mg  40 mg Oral Daily Swayze, Ava, DO       sodium chloride flush (NS) 0.9 % injection 3 mL  3 mL Intravenous Q12H Carol Ada, MD         Discharge Medications: Please see discharge summary for a list of discharge medications.  Relevant Imaging Results:  Relevant Lab Results:   Additional Information   [phone];[employer] x2,booster x1)  Jensyn Shave, Juliann Pulse, RN

## 2021-09-21 NOTE — Anesthesia Preprocedure Evaluation (Signed)
Anesthesia Evaluation  Patient identified by MRN, date of birth, ID band Patient awake    Reviewed: Allergy & Precautions, NPO status , Patient's Chart, lab work & pertinent test results  Airway Mallampati: III  TM Distance: >3 FB Neck ROM: Full    Dental  (+) Dental Advisory Given, Poor Dentition, Chipped,    Pulmonary Patient abstained from smoking., former smoker,    Pulmonary exam normal breath sounds clear to auscultation       Cardiovascular hypertension, Normal cardiovascular exam Rhythm:Regular Rate:Normal     Neuro/Psych PSYCHIATRIC DISORDERS Depression negative neurological ROS     GI/Hepatic negative GI ROS, Neg liver ROS,   Endo/Other  negative endocrine ROS  Renal/GU Renal InsufficiencyRenal disease     Musculoskeletal negative musculoskeletal ROS (+)   Abdominal   Peds  Hematology  (+) Blood dyscrasia, anemia ,   Anesthesia Other Findings Day of surgery medications reviewed with the patient.  Reproductive/Obstetrics                             Anesthesia Physical Anesthesia Plan  ASA: 2  Anesthesia Plan: MAC   Post-op Pain Management: Minimal or no pain anticipated   Induction: Intravenous  PONV Risk Score and Plan: 2 and TIVA and Treatment may vary due to age or medical condition  Airway Management Planned: Nasal Cannula and Natural Airway  Additional Equipment:   Intra-op Plan:   Post-operative Plan:   Informed Consent: I have reviewed the patients History and Physical, chart, labs and discussed the procedure including the risks, benefits and alternatives for the proposed anesthesia with the patient or authorized representative who has indicated his/her understanding and acceptance.     Dental advisory given  Plan Discussed with: CRNA and Anesthesiologist  Anesthesia Plan Comments:         Anesthesia Quick Evaluation

## 2021-09-21 NOTE — Interval H&P Note (Signed)
History and Physical Interval Note:  09/21/2021 12:04 PM  [name]  has presented today for surgery, with the diagnosis of Anemia, heme positive stool, hypotension.  The various methods of treatment have been discussed with the patient and family. After consideration of risks, benefits and other options for treatment, the patient has consented to  Procedure(s): ESOPHAGOGASTRODUODENOSCOPY (EGD) WITH PROPOFOL (N/A) as a surgical intervention.  The patient's history has been reviewed, patient examined, no change in status, stable for surgery.  I have reviewed the patient's chart and labs.  Questions were answered to the patient's satisfaction.     [name]

## 2021-09-21 NOTE — Progress Notes (Signed)
PROGRESS NOTE  [name] DXI:[id number] DOB: [birth year] DOA: 09/19/2021 PCP: Debbrah Alar, NP  Brief History   The patient is a 76 yr old woman who presents to ED with complaints of hypotension. Patient reports significant depression in the last 3 and half months worsened since the passing of her husband in September 2022.  She has had anhedonia, decreased activity, lethargy, poor oral intake.  She has continued to take her antihypertensives. The patient was FOBT positive. Hemoglobin upon presentation was 10.5 initially, repeat hemoglobin was 8.7.   Hemoglobin this morning is stable at 10.8.  The patient was admitted to a telemetry bed. Dr. Benson Norway was consulted for GI.   Consultants  Gastroenterology  Procedures  None  Antibiotics   Anti-infectives (From admission, onward)    None      Subjective  The patient is tearful. She has no new complaints.   Objective   Vitals:  Vitals:   09/21/21 1330 09/21/21 1419  BP: 128/71 (!) 126/92  Pulse: 80 76  Resp: 20 18  Temp:  97.6 F (36.4 C)  SpO2: 97% 99%    Exam:  Constitutional:  The patient is awake, alert, and oriented x 3. No acute distress. Respiratory:  No increased work of breathing. No wheezes, rales, or rhonchi No tactile fremitus Cardiovascular:  Regular rate and rhythm No murmurs, ectopy, or gallups. No lateral PMI. No thrills. Abdomen:  Abdomen is soft, non-tender, non-distended No hernias, masses, or organomegaly Normoactive bowel sounds.  Musculoskeletal:  No cyanosis, clubbing, or edema Skin:  No rashes, lesions, ulcers palpation of skin: no induration or nodules Neurologic:  CN 2-12 intact Sensation all 4 extremities intact Psychiatric:  Mental status Mood, affect appropriate Orientation to person, place, time  judgment and insight appear intact   I have personally reviewed the following:   Today's Data  Vitals  Lab Data  CBC, BMP  Micro Data    Imaging     Cardiology Data    Other Data    Scheduled Meds:  feeding supplement  237 mL Oral TID BM   [START ON 7/67/3419] folic acid  1 mg Oral Daily   [START ON 09/22/2021] multivitamin with minerals  1 tablet Oral Daily   pantoprazole  40 mg Oral Daily   sodium chloride flush  3 mL Intravenous Q12H    Principal Problem:   GI bleed Active Problems:   Hypotension   Hypokalemia   Chronic kidney disease, stage 3a (HCC)   Depression   Symptomatic anemia   LOS: 1 day   A & P  * GI bleed Hemoglobin dropped from 10.5-8.7 while holding in the ED.  FOBT was positive.  Patient has not seen any frank bleeding other than spots from her hemorrhoid and has not seen any melena.  Colonoscopy 06/16/2019 showed diverticulosis with multiple polyps.  Became hypotensive in the ED, responding to IV fluids at this point. -Continue IV Protonix infusion -Continue IV fluid hydration -Follow H&H -Type and screen -Transfuse PRBC for any recurrent hypotension or worsening hemoglobin; patient consented to blood transfusion on admission -EGD was performed and demonstrated duodenitis without bleeding; esophageal obstruction, diaphragmatic hernia without obstruction or gangrene, gastritis without bleeding, non-obstructing Schatzki ring.  -The recommendation is for continued proton pump inhibitor therapy and to continue to monitor hemoglobin.    Hypotension Potentially from GI bleeding but also in setting of poor oral intake and continued antihypertensive use. -Continue IV fluids as above -Hold home Toprol-XL and Micardis - Blood pressures are  resolved.   Hypokalemia Severe hypokalemia on ED arrival, improving with initial supplementation to 2.8.  Resolved.   Depression Grief since the passing of her husband last September now with significant depression symptoms over the last 3 and half months.   Chronic kidney disease, stage 3a ([place]) Renal function relatively stable, continue to monitor. Creatinine is  improving.   DVT prophylaxis: SCDs Start: 09/19/21 2354 Code Status: Full code Family Communication: Discussed with patient, she has discussed with family Disposition Plan: From home, dispo pending clinical progress Consults called: EDP notified GI Dr Mann/Hung team.  Karie Kirks, DO [facility] Direct contact: see [email or web address]  7PM-7AM contact night coverage as above 09/21/2021, 5:38 PM  LOS: 1 day

## 2021-09-21 NOTE — Evaluation (Signed)
Physical Therapy Evaluation Patient Details Name: [name] MRN: [id number] DOB: 11-Dec-1945 Today's Date: 09/21/2021  History of Present Illness  76 y.o. female with medical history significant for HTN, CKD stage IIIa, cervical dysplasia, endometrial hyperplasia and admitted 09/19/21 with hypotension with suspected GI bleeding and severe hypokalemia.  Patient reports significant depression in the last 3 and half months however has been since the passing of her husband in September 2022.  Clinical Impression  Pt admitted with above diagnosis.  Pt currently with functional limitations due to the deficits listed below (see PT Problem List). Pt will benefit from skilled PT to increase their independence and safety with mobility to allow discharge to the venue listed below.   Pt assisted with standing at edge of bed.  Pt felt unable to march in place or ambulate due to weakness and fatigue.  Pt reports sleeping in recliner, has lost interest in all activities, and eats very little due to worsening depression.  Daughter visiting from [facility] reports decrease in pt's mobility since her last visit in May and worried about pt living alone.  Recommend SNF upon d/c at this time.        Recommendations for follow up therapy are one component of a multi-disciplinary discharge planning process, led by the attending physician.  Recommendations may be updated based on patient status, additional functional criteria and insurance authorization.  Follow Up Recommendations Skilled nursing-short term rehab (<3 hours/day) Can patient physically be transported by private vehicle: No    Assistance Recommended at Discharge    Patient can return home with the following  A little help with walking and/or transfers;A little help with bathing/dressing/bathroom;Assistance with cooking/housework;Direct supervision/assist for medications management;Assist for transportation    Equipment Recommendations None recommended by PT   Recommendations for Other Services       Functional Status Assessment Patient has had a recent decline in their functional status and demonstrates the ability to make significant improvements in function in a reasonable and predictable amount of time.     Precautions / Restrictions Precautions Precautions: Fall      Mobility  Bed Mobility Overal bed mobility: Needs Assistance Bed Mobility: Supine to Sit, Sit to Supine     Supine to sit: Mod assist Sit to supine: Min assist   General bed mobility comments: assist for trunk upright, pt "plopped" upper body back into bed due to fatigue and assisted with LEs    Transfers Overall transfer level: Needs assistance Equipment used: Rolling walker (2 wheels) Transfers: Sit to/from Stand Sit to Stand: Min assist           General transfer comment: reliant on UEs for assist, able to stand twice however fatigues quickly, pt reports shakiness and fearful of falling    Ambulation/Gait               General Gait Details: pt felt too weak today, and fearful of falling  Stairs            Wheelchair Mobility    Modified Rankin (Stroke Patients Only)       Balance Overall balance assessment: History of Falls, Needs assistance         Standing balance support: Bilateral upper extremity supported, Reliant on assistive device for balance Standing balance-Leahy Scale: Poor                               Pertinent Vitals/Pain Pain Assessment Pain Assessment:  Faces Faces Pain Scale: Hurts even more Pain Location: "back, hips, everywhere" Pain Descriptors / Indicators: Aching, Sore Pain Intervention(s): Repositioned, Monitored during session    [place] expects to be discharged to:: Private residence Living Arrangements: Alone   Type of Home: House         Home Layout: One level Home Equipment: Rollator (4 wheels)      Prior Function Prior Level of Function :  Independent/Modified Independent;Needs assist             Mobility Comments: significant decline in mobility over the last few months per daughter, pt now sleeping in recliner, reports she barely eats, pt reports she is still grieving the loss of her husband of 60 years who passed away in 28-Nov-2022, lives alone, daughter lives in [place] and has been sending groceries to her home       Hand Dominance        Extremity/Trunk Assessment        Lower Extremity Assessment Lower Extremity Assessment: Generalized weakness       Communication   Communication: No difficulties  Cognition Arousal/Alertness: Awake/alert Behavior During Therapy: Flat affect Overall Cognitive Status: Within Functional Limits for tasks assessed                                          General Comments      Exercises     Assessment/Plan    PT Assessment Patient needs continued PT services  PT Problem List Decreased mobility;Decreased activity tolerance;Decreased strength;Decreased balance;Decreased knowledge of use of DME       PT Treatment Interventions Gait training;DME instruction;Functional mobility training;Therapeutic activities;Patient/family education;Balance training;Therapeutic exercise    PT Goals (Current goals can be found in the Care Plan section)  Acute Rehab PT Goals PT Goal Formulation: With patient/family Time For Goal Achievement: 10/05/21 Potential to Achieve Goals: Good    Frequency Min 2X/week     Co-evaluation               AM-PAC PT "6 Clicks" Mobility  Outcome Measure Help needed turning from your back to your side while in a flat bed without using bedrails?: A Little Help needed moving from lying on your back to sitting on the side of a flat bed without using bedrails?: A Lot Help needed moving to and from a bed to a chair (including a wheelchair)?: A Lot Help needed standing up from a chair using your arms (e.g., wheelchair or bedside chair)?: A  Little Help needed to walk in hospital room?: A Lot Help needed climbing 3-5 steps with a railing? : A Lot 6 Click Score: 14    End of Session Equipment Utilized During Treatment: Gait belt Activity Tolerance: Patient limited by fatigue Patient left: with call bell/phone within reach;with bed alarm set;in bed;with family/visitor present Nurse Communication: Mobility status PT Visit Diagnosis: Difficulty in walking, not elsewhere classified (R26.2);Muscle weakness (generalized) (M62.81);Adult, failure to thrive (R62.7)    Time: 4765-4650 PT Time Calculation (min) (ACUTE ONLY): 18 min   Charges:   PT Evaluation $PT Eval Low Complexity: 1 Low         Kati PT, DPT Physical Therapist Acute Rehabilitation Services Preferred contact method: Secure Chat Weekend Pager Only: [phone] Office: [place] 09/21/2021, 3:32 PM

## 2021-09-21 NOTE — Transfer of Care (Signed)
Immediate Anesthesia Transfer of Care Note  Patient: [name]  Procedure(s) Performed: ESOPHAGOGASTRODUODENOSCOPY (EGD) WITH PROPOFOL BIOPSY  Patient Location: PACU  Anesthesia Type:MAC  Level of Consciousness: awake and patient cooperative  Airway & Oxygen Therapy: Patient Spontanous Breathing and Patient connected to face mask  Post-op Assessment: Report given to RN and Post -op Vital signs reviewed and stable  Post vital signs: Reviewed and stable  Last Vitals:  Vitals Value Taken Time  BP 100/72 09/21/21 1317  Temp    Pulse 78 09/21/21 1318  Resp 20 09/21/21 1318  SpO2 99 % 09/21/21 1318  Vitals shown include unvalidated device data.  Last Pain:  Vitals:   09/21/21 1218  TempSrc: Oral  PainSc: 0-No pain      Patients Stated Pain Goal: 0 (97/35/32 9924)  Complications: No notable events documented.

## 2021-09-21 NOTE — Progress Notes (Signed)
PT Cancellation Note  Patient Details Name: [name] MRN: [id number] DOB: 28-Oct-1945   Cancelled Treatment:    Reason Eval/Treat Not Completed: Patient at procedure or test/unavailable   Kati L Payson 09/21/2021, 12:12 PM Arlyce Dice, DPT Physical Therapist Acute Rehabilitation Services Preferred contact method: [place] Weekend Pager Only: [phone] Office: [phone]

## 2021-09-21 NOTE — Progress Notes (Signed)
Initial Nutrition Assessment  DOCUMENTATION CODES:   Not applicable  INTERVENTION:   Ensure Enlive po TID, each supplement provides 350 kcal and 20 grams of protein.  MVI po daily   Folic acid 1.'0mg'$  po daily   Pt at moderate refeed risk; recommend monitor potassium, magnesium and phosphorus labs daily until stable  NUTRITION DIAGNOSIS:   Inadequate oral intake related to acute illness as evidenced by per patient/family report.  GOAL:   Patient will meet greater than or equal to 90% of their needs  MONITOR:   PO intake, Supplement acceptance, Labs, Weight trends, Skin, I & O's  REASON FOR ASSESSMENT:   Malnutrition Screening Tool    ASSESSMENT:   76 y/o female with h/o cervical dysplasia, endometrial hyperplasia, depression, CKD III, HTN, HLD and vitamin D deficiency who is admitted with GIB.  Pt s/p EGD today; found to have schatzki ring, hiatal hernia, gastritis, duodenitis.   RD working remotely.  Unable to reach pt by phone as pt in endoscopy at time of RD visit. Per chart review, pt reports decreased oral intake and weakness pta. Per chart, pt is down 11lbs(7%) since December; RD unsure how recently weight loss occurred. Pt NPO today for EGD. Pt ordered for regular diet after procedure. RD will add supplements and MVI to help pt meet her estimated needs. Pt is at refeed risk. RD will obtain nutrition related history and exam at follow-up.   Medications reviewed and include: protonix, LRS '@125ml'$ /hr  Labs reviewed: K 5.1 wnl Mg 1.6(L)- 7/20 Folate 3.8(L)- 7/19 Hgb 10.8(L), Hct 33.2(L)  NUTRITION - FOCUSED PHYSICAL EXAM: Unable to perform at this time   Diet Order:   Diet Order             Diet regular Room service appropriate? Yes; Fluid consistency: Thin  Diet effective now                  EDUCATION NEEDS:   No education needs have been identified at this time  Skin:  Skin Assessment: Reviewed RN Assessment  Last BM:  7/21  Height:   Ht  Readings from Last 1 Encounters:  09/21/21 '5\' 4"'$  (1.626 m)    Weight:   Wt Readings from Last 1 Encounters:  09/21/21 61.7 kg    Ideal Body Weight:  54.5 kg  BMI:  Body mass index is 23.34 kg/m.  Estimated Nutritional Needs:   Kcal:  1400-1600kcal/day  Protein:  70-80g/day  Fluid:  1.4-1.6L/day  Koleen Distance MS, RD, LDN Please refer to [facility] for RD and/or RD on-call/weekend/after hours pager

## 2021-09-21 NOTE — Progress Notes (Signed)
  Transition of Care [facility]) Screening Note   Patient Details  Name: [name] Date of Birth: 1945/09/01   Transition of Care [facility]) CM/SW Contact:    Dessa Phi, RN Phone Number: 09/21/2021, 2:16 PM    Transition of Care Department [facility]) has reviewed patient and no TOC needs have been identified at this time. We will continue to monitor patient advancement through interdisciplinary progression rounds. If new patient transition needs arise, please place a TOC consult.

## 2021-09-22 DIAGNOSIS — F329 Major depressive disorder, single episode, unspecified: Secondary | ICD-10-CM | POA: Diagnosis not present

## 2021-09-22 DIAGNOSIS — R5381 Other malaise: Secondary | ICD-10-CM | POA: Diagnosis not present

## 2021-09-22 DIAGNOSIS — K922 Gastrointestinal hemorrhage, unspecified: Secondary | ICD-10-CM | POA: Diagnosis not present

## 2021-09-22 DIAGNOSIS — N1831 Chronic kidney disease, stage 3a: Secondary | ICD-10-CM | POA: Diagnosis not present

## 2021-09-22 LAB — HEMOGLOBIN AND HEMATOCRIT, BLOOD
HCT: 32.3 % — ABNORMAL LOW (ref 36.0–46.0)
HCT: 32.1 % — ABNORMAL LOW (ref 36.0–46.0)
Hemoglobin: 11.1 g/dL — ABNORMAL LOW (ref 12.0–15.0)
Hemoglobin: 10.5 g/dL — ABNORMAL LOW (ref 12.0–15.0)

## 2021-09-22 MED ORDER — HYDROCORTISONE (PERIANAL) 2.5 % EX CREA
TOPICAL_CREAM | Freq: Three times a day (TID) | CUTANEOUS | Status: DC
  Filled 2021-09-22: qty 28.35

## 2021-09-22 NOTE — Assessment & Plan Note (Signed)
PT/OT have recommended SNF. TOC has been consulted.

## 2021-09-22 NOTE — Assessment & Plan Note (Signed)
Noted  

## 2021-09-22 NOTE — Progress Notes (Signed)
PROGRESS NOTE  [name] RCB:[id number] DOB: 08/12/1945 DOA: 09/19/2021 PCP: Debbrah Alar, NP  Brief History   The patient is a 76 yr old woman who presents to ED with complaints of hypotension. Patient reports significant depression in the last 3 and half months worsened since the passing of her husband in September 2022.  She has had anhedonia, decreased activity, lethargy, poor oral intake.  She has continued to take her antihypertensives. The patient was FOBT positive. Hemoglobin upon presentation was 10.5 initially, repeat hemoglobin was 8.7.   Hemoglobin this morning is stable at 10.5.  The patient was admitted to a telemetry bed. Dr. Benson Norway was consulted for GI.   Consultants  Gastroenterology  Procedures  None  Antibiotics   Anti-infectives (From admission, onward)    None      Subjective  The patient is resting comfortably. Her daughter is at bedside. She has no new complaints.   Objective   Vitals:  Vitals:   09/21/21 2025 09/22/21 1327  BP: (!) 122/91 106/78  Pulse: 74 (!) 105  Resp: 18 16  Temp: 98.6 F (37 C) 98.3 F (36.8 C)  SpO2: 99% 98%    Exam:  Constitutional:  The patient is awake, alert, and oriented x 3. No acute distress. Respiratory:  No increased work of breathing. No wheezes, rales, or rhonchi No tactile fremitus Cardiovascular:  Regular rate and rhythm No murmurs, ectopy, or gallups. No lateral PMI. No thrills. Abdomen:  Abdomen is soft, non-tender, non-distended No hernias, masses, or organomegaly Normoactive bowel sounds.  Musculoskeletal:  No cyanosis, clubbing, or edema Skin:  No rashes, lesions, ulcers palpation of skin: no induration or nodules Neurologic:  CN 2-12 intact Sensation all 4 extremities intact Psychiatric:  Mental status Mood, affect appropriate Orientation to person, place, time  judgment and insight appear intact   I have personally reviewed the following:   Today's Data   Vitals  Lab Data  CBC, BMP  Micro Data    Imaging    Cardiology Data    Other Data    Scheduled Meds:  feeding supplement  237 mL Oral TID BM   folic acid  1 mg Oral Daily   hydrocortisone   Rectal TID   multivitamin with minerals  1 tablet Oral Daily   pantoprazole  40 mg Oral Daily   sodium chloride flush  3 mL Intravenous Q12H    Principal Problem:   GI bleed Active Problems:   Hypotension   Hypokalemia   Chronic kidney disease, stage 3a (HCC)   Depression   Symptomatic anemia   Debility   LOS: 2 days   A & P  Assessment and Plan: * GI bleed Hemoglobin dropped from 10.5-8.7 while holding in the ED.  FOBT was positive.  Patient has not seen any frank bleeding other than spots from her hemorrhoid and has not seen any melena.  Colonoscopy 06/16/2019 showed diverticulosis with multiple polyps.  Became hypotensive in the ED, responding to IV fluids at this point. Pt underwent EGD on 09/21/2021. This has demonstrated gastritis and duodenitis. She was restarted on a diet. She has been continued on protonix as recommended by Dr. Benson Norway. Will continue to monitor hemoglobin.  Hypotension Potentially from GI bleeding but also in setting of poor oral intake and continued antihypertensive use. -Continue IV fluids as above -Hold home Toprol-XL and Micardis. The patient is now normotensive without home antihypertensives. Will restart them as warranted.  Hypokalemia Resolved. Monitor.   Debility PT/OT have recommended  SNF. TOC has been consulted.  Symptomatic anemia Noted.   Depression Grief since the passing of her husband last September now with significant depression symptoms over the last 3 and half months.  Chronic kidney disease, stage 3a ([place]) Renal function stable, continue to monitor.   DVT prophylaxis: SCDs Start: 09/19/21 2354 Code Status: Full code Family Communication: Daughter is at bedside. Disposition Plan: SNF Consults called: EDP notified GI Dr  Mann/Hung team.  Karie Kirks, DO [facility] Direct contact: see [email or web address]  7PM-7AM contact night coverage as above 09/22/2021, 4:54 PM  LOS: 1 day

## 2021-09-22 NOTE — Evaluation (Signed)
Occupational Therapy Evaluation Patient Details Name: [name] MRN: [id number] DOB: [birth year] Today's Date: 09/22/2021   History of Present Illness 76 y.o. female with medical history significant for HTN, CKD stage IIIa, cervical dysplasia, endometrial hyperplasia and admitted 09/19/21 with hypotension with suspected GI bleeding and severe hypokalemia.  Patient reports significant depression in the last 3 and half months however has been since the passing of her husband in September 2022.   Clinical Impression   Patient is a 76 year old female who was admitted for above. Patient was living at home alone prior level with no AD. Patient was noted to have pain impacting participation in session with patient becoming tearful sporadically during session. Daughter was present for session as well.  Patient was mod A for LB dressing with min A to transfer with RW to recliner in room with increased time. Patient was noted to have decreased functional activity tolerance, decreased endurance, decreased standing balance, decreased safety awareness, and decreased knowledge of AD/AE impacting participation in ADLs.  Patient would continue to benefit from skilled OT services at this time while admitted and after d/c to address noted deficits in order to improve overall safety and independence in ADLs.       Recommendations for follow up therapy are one component of a multi-disciplinary discharge planning process, led by the attending physician.  Recommendations may be updated based on patient status, additional functional criteria and insurance authorization.   Follow Up Recommendations  Skilled nursing-short term rehab (<3 hours/day)    Assistance Recommended at Discharge Frequent or constant Supervision/Assistance  Patient can return home with the following A little help with walking and/or transfers;A little help with bathing/dressing/bathroom;Assistance with cooking/housework;Direct  supervision/assist for financial management;Assist for transportation;Help with stairs or ramp for entrance;Direct supervision/assist for medications management    Functional Status Assessment  Patient has had a recent decline in their functional status and demonstrates the ability to make significant improvements in function in a reasonable and predictable amount of time.  Equipment Recommendations  None recommended by OT    Recommendations for Other Services       Precautions / Restrictions Precautions Precautions: Fall Restrictions Weight Bearing Restrictions: No      Mobility Bed Mobility Overal bed mobility: Needs Assistance Bed Mobility: Supine to Sit, Sit to Supine     Supine to sit: Mod assist     General bed mobility comments: with physical assistance for bringing trunk into midline on EOB with cues for proper hand placement.    Transfers                          Balance Overall balance assessment: History of Falls, Needs assistance Sitting-balance support: No upper extremity supported Sitting balance-Leahy Scale: Fair     Standing balance support: Bilateral upper extremity supported, Reliant on assistive device for balance Standing balance-Leahy Scale: Poor                             ADL either performed or assessed with clinical judgement   ADL Overall ADL's : Needs assistance/impaired Eating/Feeding: Sitting;Set up   Grooming: Set up;Sitting Grooming Details (indicate cue type and reason): declined to try in standing Upper Body Bathing: Minimal assistance;Sitting   Lower Body Bathing: Moderate assistance;Sit to/from stand;Sitting/lateral leans   Upper Body Dressing : Minimal assistance;Sitting   Lower Body Dressing: Moderate assistance;Sit to/from stand;Sitting/lateral leans Lower Body Dressing Details (indicate  cue type and reason): reliant on UE support on walker in standing. needed assist to don socks in bed with min A  encouragement to try task prior to asking daughter to complete for her. Toilet Transfer: Minimal assistance;Stand-pivot;Rolling walker (2 wheels) Toilet Transfer Details (indicate cue type and reason): patient transfered from edge of bed to recliner in room with min A with RW with increased time and cues for proper hand and foot placement. patient declined to attempt to walk to bathroom or trial sitting on BSC on this date. patient was shown BSC was higher than recliner height at this time. patient reported toielt was too low. patient was educated that she could use BSC at bedside or transition it over toilet with nursing staff. patient declined again to compelte either of these tasks. Toileting- Clothing Manipulation and Hygiene: Maximal assistance;Sit to/from stand       Functional mobility during ADLs: Minimal assistance;Rolling walker (2 wheels)       Vision Baseline Vision/History: 1 Wears glasses Patient Visual Report: No change from baseline       Perception     Praxis      Pertinent Vitals/Pain Pain Assessment Pain Assessment: Faces Faces Pain Scale: Hurts little more Pain Location: all over Pain Descriptors / Indicators: Aching, Sore Pain Intervention(s): Limited activity within patient's tolerance, Monitored during session, Patient requesting pain meds-RN notified, Repositioned     Hand Dominance     Extremity/Trunk Assessment Upper Extremity Assessment Upper Extremity Assessment: Overall WFL for tasks assessed (noted to have edema with brusing on proximal forearm with large bruise noted.)   Lower Extremity Assessment Lower Extremity Assessment: Defer to PT evaluation       Communication Communication Communication: No difficulties   Cognition Arousal/Alertness: Awake/alert Behavior During Therapy: Flat affect Overall Cognitive Status: Within Functional Limits for tasks assessed                                 General Comments: was noted to be  tearful at times and easily irritated. daughter in room during session     General Comments       Exercises     Shoulder Instructions      Home Living Family/patient expects to be discharged to:: Private residence Living Arrangements: Alone Available Help at Discharge: Other (Comment) (family plans to higher caregiver to assist with meals and IADLs as needed at time of d/c.) Type of Home: House       Home Layout: One level     Bathroom Shower/Tub: Walk-in shower         Home Equipment: Rollator (4 wheels)          Prior Functioning/Environment Prior Level of Function : Independent/Modified Independent;Needs assist                        OT Problem List: Decreased activity tolerance;Decreased knowledge of precautions;Decreased knowledge of use of DME or AE      OT Treatment/Interventions: Self-care/ADL training;Therapeutic exercise;Neuromuscular education;Energy conservation;DME and/or AE instruction;Therapeutic activities;Balance training;Patient/family education    OT Goals(Current goals can be found in the care plan section) Acute Rehab OT Goals Patient Stated Goal: to get legs stronger OT Goal Formulation: With patient/family Time For Goal Achievement: 10/06/21 Potential to Achieve Goals: Fair  OT Frequency: Min 2X/week    Co-evaluation              AM-PAC OT "6  Clicks" Daily Activity     Outcome Measure Help from another person eating meals?: A Little Help from another person taking care of personal grooming?: A Little Help from another person toileting, which includes using toliet, bedpan, or urinal?: A Lot Help from another person bathing (including washing, rinsing, drying)?: A Lot Help from another person to put on and taking off regular upper body clothing?: A Little Help from another person to put on and taking off regular lower body clothing?: A Lot 6 Click Score: 15   End of Session Equipment Utilized During Treatment: Gait  belt;Rolling walker (2 wheels) Nurse Communication: Other (comment) (ok to participate in session and updated on transfer status after session)  Activity Tolerance: Patient tolerated treatment well Patient left: in chair;with call bell/phone within reach;with family/visitor present  OT Visit Diagnosis: Unsteadiness on feet (R26.81);Muscle weakness (generalized) (M62.81);Other abnormalities of gait and mobility (R26.89);Pain                Time: 1137-1208 OT Time Calculation (min): 31 min Charges:  OT General Charges $OT Visit: 1 Visit OT Evaluation $OT Eval Moderate Complexity: 1 Mod OT Treatments $Self Care/Home Management : 8-22 mins  Jackelyn Poling OTR/L, MS Acute Rehabilitation Department Office# [phone] Pager# [phone]   Marcellina Millin 09/22/2021, 12:43 PM

## 2021-09-23 DIAGNOSIS — K922 Gastrointestinal hemorrhage, unspecified: Secondary | ICD-10-CM | POA: Diagnosis not present

## 2021-09-23 DIAGNOSIS — R5381 Other malaise: Secondary | ICD-10-CM | POA: Diagnosis not present

## 2021-09-23 DIAGNOSIS — F329 Major depressive disorder, single episode, unspecified: Secondary | ICD-10-CM | POA: Diagnosis not present

## 2021-09-23 DIAGNOSIS — N1831 Chronic kidney disease, stage 3a: Secondary | ICD-10-CM | POA: Diagnosis not present

## 2021-09-23 LAB — BASIC METABOLIC PANEL WITH GFR
Anion gap: 7 (ref 5–15)
BUN: 9 mg/dL (ref 8–23)
CO2: 28 mmol/L (ref 22–32)
Calcium: 8.3 mg/dL — ABNORMAL LOW (ref 8.9–10.3)
Chloride: 106 mmol/L (ref 98–111)
Creatinine, Ser: 1.17 mg/dL — ABNORMAL HIGH (ref 0.44–1.00)
GFR, Estimated: 49 mL/min — ABNORMAL LOW
Glucose, Bld: 123 mg/dL — ABNORMAL HIGH (ref 70–99)
Potassium: 4.1 mmol/L (ref 3.5–5.1)
Sodium: 141 mmol/L (ref 135–145)

## 2021-09-23 LAB — CBC WITH DIFFERENTIAL/PLATELET
Abs Immature Granulocytes: 0.05 10*3/uL (ref 0.00–0.07)
Basophils Absolute: 0 10*3/uL (ref 0.0–0.1)
Basophils Relative: 1 %
Eosinophils Absolute: 0.1 10*3/uL (ref 0.0–0.5)
Eosinophils Relative: 2 %
HCT: 31 % — ABNORMAL LOW (ref 36.0–46.0)
Hemoglobin: 10.2 g/dL — ABNORMAL LOW (ref 12.0–15.0)
Immature Granulocytes: 1 %
Lymphocytes Relative: 38 %
Lymphs Abs: 1.7 10*3/uL (ref 0.7–4.0)
MCH: 34.1 pg — ABNORMAL HIGH (ref 26.0–34.0)
MCHC: 32.9 g/dL (ref 30.0–36.0)
MCV: 103.7 fL — ABNORMAL HIGH (ref 80.0–100.0)
Monocytes Absolute: 0.5 10*3/uL (ref 0.1–1.0)
Monocytes Relative: 12 %
Neutro Abs: 2.1 10*3/uL (ref 1.7–7.7)
Neutrophils Relative %: 46 %
Platelets: 278 10*3/uL (ref 150–400)
RBC: 2.99 MIL/uL — ABNORMAL LOW (ref 3.87–5.11)
RDW: 18.6 % — ABNORMAL HIGH (ref 11.5–15.5)
WBC: 4.5 10*3/uL (ref 4.0–10.5)
nRBC: 0 % (ref 0.0–0.2)

## 2021-09-23 NOTE — TOC Progression Note (Signed)
Transition of Care [facility]) - Progression Note    Patient Details  Name: [name] MRN: [id number] Date of Birth: Jul 09, 1945  Transition of Care [facility]) CM/SW Contact  Zemira Zehring, Juliann Pulse, RN Phone Number: 09/23/2021, 11:08 AM  Clinical Narrative: Bed offers to be  given then await choice, prior auth.   1. 1.5 mi [facility] [place], [place] [address] [phone] Overall rating Much below average 2. 1.9 mi [place] [facility] [place] [address] [place], [place] [address] [phone] Overall rating Average 3. 2 mi [facility] [address] [place], [place] [address] [phone] Overall rating Below average 4. 2.4 mi [facility] at the [place], [place] [address] [phone] Overall rating Above average 5. 2.6 mi [facility] [place], [place] [address] [phone] Overall rating Much below average 6. 3.4 mi [place], [place] [address] [phone] Overall rating Much below average 7. 3.7 mi [facility] [place], [place] [address] [phone] Overall rating Much above average 8. 3.9 mi [facility] [place], [place] [address] [phone] Overall rating Much above average 9. [place] [facility] [address] [place], [place] [address] [phone] Overall rating Average 10. 4.2 mi [place] [address] [place], [place] [address] [phone] Overall rating Much below average 11. 4.6 mi [facility] [address] [place], [place] [address] [phone] Overall rating Much below average 12. 6.3 mi [facility] [address] [place], [place] [address] [phone] Overall rating Above average 13. 9.2 mi [place], [place] [address] [phone] Overall rating Below average 14. 9.6 [place], [place] [address] [phone] Overall rating Much above average 15. 9.8 mi [facility] [address], [place] [address] [phone] Overall rating Above average 16. 9.9 mi [facility] [address] [place], [place] [address] [phone] Overall rating Much above average 17. 10.7 mi [place] at [facility] [address] [place], [place] [address] [phone] Overall rating Much above average 18. 13.3 [facility] [address] [place], [place] [address] [phone] Overall rating Much below average 19. 13.6 mi [facility] [address] [place], [place] [address] [phone] Overall rating Much below average 20. 14.2 mi [facility] [place], [place] [address] [phone] Overall rating Much above average 21. 14.4 [place] [address] [place], [place] [address] [phone] Overall rating Much below average 22. 15 mi [facility] [place], [place] [address] [phone] Overall rating Much above average 23. 15.2 mi [place] [address] [place], [place] [address] [phone] Overall rating Above average 24. 15.3 mi [facility] [place] [address] [place], [place] [address] [phone] Overall rating Below average 25. 16.9 mi [facility] [place], [place] [address] [phone] Overall rating Average 26. 18.1 mi [place],  [address] [phone] Overall rating Below average 27. 18.9 mi [facility] [address] [place], [place] [address] [phone] Overall rating Much below average 28. 19.7 mi [facility] and [facility] [place], [place] [address] [phone] Overall rating Much below average 29. 20.2 mi [address] at the [facility] [place], [place] [address] [phone] Overall rating Much above average 30. 20.4 mi [facility] [address] [place], [place] [address] [phone] Overall rating Much below average 31. 20.4 mi [facility] [address] [place], [place] [address] [phone] Overall rating Much below average 32. 20.6 [place], [place] [address] [phone] Overall rating Much above average 33. 21.2 [address] [place], [place] [address] [phone] Overall rating Below average 34. 22.7 [place] [address] [place], [place] [address] [phone] Overall rating Below average 35. 22.8 mi [employer] [address] [place], [place] [address] [phone] Overall rating Much above average 36. 23.1 mi [place] [address] [place], [place] [address] [phone] Overall rating Average 37. 23.5 mi [facility] [address] [place], [place] [address] [phone] Overall rating Above average 38. 23.7 [place], [place] [address] [phone] Overall rating Not available18 39. [place], [place] [address] [phone] Overall rating Much below average 40. 24.8  mi [place] [address] [place], [place] [address] [phone] Overall rating Above average To explore and download nursing home dat  Expected Discharge Plan: [place] Barriers to Discharge: Continued Medical Work up  Expected Discharge Plan and Services Expected Discharge Plan: [place]                                               Social Determinants of Health (SDOH) Interventions    Readmission Risk Interventions     No data to display

## 2021-09-23 NOTE — TOC Progression Note (Addendum)
Transition of Care [facility]) - Progression Note    Patient Details  Name: [name] MRN: [id number] Date of Birth: [birth year]  Transition of [facility]) CM/SW Contact  Irvan Tiedt, Juliann Pulse, RN Phone Number: 09/23/2021, 1:04 PM  Clinical Narrative:  Patient/dtr [name] chose [facility]-rep Ebony Hail aware-CM will initiate auth-await outcome.     Expected Discharge Plan: Skilled Nursing Facility Barriers to Discharge: Insurance Authorization  Expected Discharge Plan and Services Expected Discharge Plan: [place]                                               Social Determinants of Health (SDOH) Interventions    Readmission Risk Interventions     No data to display

## 2021-09-23 NOTE — Progress Notes (Signed)
PROGRESS NOTE  [name] KKX:[id number] DOB: 1945-11-14 DOA: 09/19/2021 PCP: Debbrah Alar, NP  Brief History   The patient is a 76 yr old woman who presents to ED with complaints of hypotension. Patient reports significant depression in the last 3 and half months worsened since the passing of her husband in September 2022.  She has had anhedonia, decreased activity, lethargy, poor oral intake.  She has continued to take her antihypertensives. The patient was FOBT positive. Hemoglobin upon presentation was 10.5 initially, repeat hemoglobin was 8.7.   Hemoglobin this morning is stable at 10.5.  The patient was admitted to a telemetry bed. Dr. Benson Norway was consulted for GI.   Consultants  Gastroenterology  Procedures  None  Antibiotics   Anti-infectives (From admission, onward)    None      Subjective  The patient is resting comfortably. Her daughter is at bedside. She has no new complaints.   Objective   Vitals:  Vitals:   09/23/21 0552 09/23/21 1217  BP: (!) 146/108 107/73  Pulse: 100 78  Resp: 18 16  Temp: 97.9 F (36.6 C) 97.9 F (36.6 C)  SpO2: 98% 100%    Exam:  Constitutional:  The patient is awake, alert, and oriented x 3. No acute distress. Respiratory:  No increased work of breathing. No wheezes, rales, or rhonchi No tactile fremitus Cardiovascular:  Regular rate and rhythm No murmurs, ectopy, or gallups. No lateral PMI. No thrills. Abdomen:  Abdomen is soft, non-tender, non-distended No hernias, masses, or organomegaly Normoactive bowel sounds.  Musculoskeletal:  No cyanosis, clubbing, or edema Skin:  No rashes, lesions, ulcers palpation of skin: no induration or nodules Neurologic:  CN 2-12 intact Sensation all 4 extremities intact Psychiatric:  Mental status Mood, affect appropriate Orientation to person, place, time  judgment and insight appear intact   I have personally reviewed the following:   Today's Data   Vitals  Lab Data  CBC, BMP  Micro Data    Imaging    Cardiology Data    Other Data    Scheduled Meds:  feeding supplement  237 mL Oral TID BM   folic acid  1 mg Oral Daily   hydrocortisone   Rectal TID   multivitamin with minerals  1 tablet Oral Daily   pantoprazole  40 mg Oral Daily   sodium chloride flush  3 mL Intravenous Q12H    Principal Problem:   GI bleed Active Problems:   Hypotension   Hypokalemia   Chronic kidney disease, stage 3a (HCC)   Depression   Symptomatic anemia   Debility   LOS: 3 days   A & P  Assessment and Plan: * GI bleed Hemoglobin dropped from 10.5-8.7 while holding in the ED.  FOBT was positive.  Patient has not seen any frank bleeding other than spots from her hemorrhoid and has not seen any melena.  Colonoscopy 06/16/2019 showed diverticulosis with multiple polyps.  Became hypotensive in the ED, responding to IV fluids at this point. Pt underwent EGD on 09/21/2021. This has demonstrated gastritis and duodenitis. She was restarted on a diet. She has been continued on protonix as recommended by Dr. Benson Norway. Will continue to monitor hemoglobin.  Hemoglobin is stable at 10.2. Continue to monitor.  Hypotension Potentially from GI bleeding but also in setting of poor oral intake and continued antihypertensive use. -Continue IV fluids as above -Hold home Toprol-XL and Micardis. The patient is now normotensive without home antihypertensives. Will restart them as warranted.  Hypokalemia  Resolved. Monitor.   Debility PT/OT have recommended SNF. TOC has been consulted.  Symptomatic anemia Noted.   Depression Grief since the passing of her husband last September now with significant depression symptoms over the last 3 and half months.  Chronic kidney disease, stage 3a ([place]) Renal function stable, continue to monitor.   DVT prophylaxis: SCDs Start: 09/19/21 2354 Code Status: Full code Family Communication: Daughter is at  bedside. Disposition Plan: SNF Consults called: EDP notified GI Dr Mann/Hung team.  Karie Kirks, DO [facility] Direct contact: see [email or web address]  7PM-7AM contact night coverage as above 09/23/2021, 2:43 PM  LOS: 1 day

## 2021-09-24 ENCOUNTER — Telehealth: Payer: Self-pay | Admitting: Family

## 2021-09-24 ENCOUNTER — Encounter (HOSPITAL_COMMUNITY): Payer: Self-pay | Admitting: Gastroenterology

## 2021-09-24 DIAGNOSIS — E860 Dehydration: Secondary | ICD-10-CM | POA: Diagnosis not present

## 2021-09-24 DIAGNOSIS — F329 Major depressive disorder, single episode, unspecified: Secondary | ICD-10-CM | POA: Diagnosis not present

## 2021-09-24 DIAGNOSIS — N1831 Chronic kidney disease, stage 3a: Secondary | ICD-10-CM | POA: Diagnosis not present

## 2021-09-24 DIAGNOSIS — K922 Gastrointestinal hemorrhage, unspecified: Secondary | ICD-10-CM | POA: Diagnosis not present

## 2021-09-24 DIAGNOSIS — E876 Hypokalemia: Secondary | ICD-10-CM | POA: Diagnosis not present

## 2021-09-24 DIAGNOSIS — D649 Anemia, unspecified: Secondary | ICD-10-CM | POA: Diagnosis not present

## 2021-09-24 DIAGNOSIS — Z5181 Encounter for therapeutic drug level monitoring: Secondary | ICD-10-CM | POA: Diagnosis not present

## 2021-09-24 DIAGNOSIS — N390 Urinary tract infection, site not specified: Secondary | ICD-10-CM | POA: Diagnosis not present

## 2021-09-24 DIAGNOSIS — I10 Essential (primary) hypertension: Secondary | ICD-10-CM | POA: Diagnosis not present

## 2021-09-24 DIAGNOSIS — F32A Depression, unspecified: Secondary | ICD-10-CM | POA: Diagnosis not present

## 2021-09-24 DIAGNOSIS — I959 Hypotension, unspecified: Secondary | ICD-10-CM | POA: Diagnosis not present

## 2021-09-24 DIAGNOSIS — K2091 Esophagitis, unspecified with bleeding: Secondary | ICD-10-CM | POA: Diagnosis not present

## 2021-09-24 DIAGNOSIS — R5381 Other malaise: Secondary | ICD-10-CM | POA: Diagnosis not present

## 2021-09-24 DIAGNOSIS — K2981 Duodenitis with bleeding: Secondary | ICD-10-CM | POA: Diagnosis not present

## 2021-09-24 LAB — TYPE AND SCREEN
ABO/RH(D): A POS
Antibody Screen: NEGATIVE
Unit division: 0
Unit division: 0
Unit division: 0

## 2021-09-24 LAB — BPAM RBC
Blood Product Expiration Date: 202308022359
Blood Product Expiration Date: 202308022359
Blood Product Expiration Date: 202308032359
ISSUE DATE / TIME: 202307200628
Unit Type and Rh: 6200
Unit Type and Rh: 6200
Unit Type and Rh: 6200

## 2021-09-24 LAB — SURGICAL PATHOLOGY

## 2021-09-24 MED ORDER — HYDROCORTISONE (PERIANAL) 2.5 % EX CREA: TOPICAL_CREAM | Freq: Three times a day (TID) | CUTANEOUS | 0 refills | Status: DC

## 2021-09-24 MED ORDER — METOPROLOL TARTRATE 25 MG PO TABS
12.5000 mg | ORAL_TABLET | Freq: Two times a day (BID) | ORAL | Status: DC
  Administered 2021-09-24: 12.5 mg via ORAL
  Filled 2021-09-24: qty 1

## 2021-09-24 MED ORDER — PANTOPRAZOLE SODIUM 40 MG PO TBEC: 40.0000 mg | DELAYED_RELEASE_TABLET | Freq: Every day | ORAL | 0 refills | Status: DC

## 2021-09-24 MED ORDER — ENSURE ENLIVE PO LIQD: 237.0000 mL | Freq: Three times a day (TID) | ORAL | 12 refills | Status: DC

## 2021-09-24 MED ORDER — HYDROCODONE-ACETAMINOPHEN 5-325 MG PO TABS: 1.0000 | ORAL_TABLET | Freq: Four times a day (QID) | ORAL | 0 refills | Status: DC | PRN

## 2021-09-24 MED ORDER — SODIUM CHLORIDE 0.9 % IV BOLUS: 500.0000 mL | Freq: Once | INTRAVENOUS | Status: DC

## 2021-09-24 MED ORDER — FOLIC ACID 1 MG PO TABS: 1.0000 mg | ORAL_TABLET | Freq: Every day | ORAL | 0 refills | Status: DC

## 2021-09-24 NOTE — Progress Notes (Signed)
Patient while up in the bathroom on telemetry noted patient's heart rate 160s. Patient sitting on side of bed and heart rate did slow to low 100s. VS otherwise stable. MD sent secure chat

## 2021-09-24 NOTE — Progress Notes (Signed)
MD in to see Patient. New orders placed for IV [place] Patient has no IV line and RN explained to patient need for restarting IV. Patient upset and is refusing to be restuck for IV. MD made aware via secure chat

## 2021-09-24 NOTE — Progress Notes (Signed)
Patient to be discharged to [facility] called to facility and Cherylann Parr LPN accepting report for this facility.

## 2021-09-24 NOTE — Progress Notes (Signed)
Patient to discharge to [facility] this afternoon. Patient's Daughter voicing concern over timeframe of when ambulance will arrive for transport. RN called [facility] and Patient is 5th on the list estimating time of 2 hours. Patient's Daughter states "I don't drive after dark and I need to be there when she arrives. Can I transport here in my Car?" Rehab facility called and ok for Daughter to transport. All discharge  paperwork given to Daughter at time of discharge for the Facility

## 2021-09-24 NOTE — TOC Transition Note (Addendum)
Transition of Care [facility]) - CM/SW Discharge Note   Patient Details  Name: [name] MRN: [id number] Date of Birth: Feb 27, 1946  Transition of Care [facility]) CM/SW Contact:  Dessa Phi, RN Phone Number: 09/24/2021, 9:14 AM   Clinical Narrative: Josem Kaufmann for [facility] through [place] [phone] AE#[id number] until 7/26 rp [place] has bed available. Awaiting d/c summary if stable.MD notified.  -2:28p going to rm#[id number],report tel#[phone]. [facility] called. No further CM needs.    Final next level of care: Skilled Nursing Facility Barriers to Discharge: No Barriers Identified   Patient Goals and CMS Choice        Discharge Placement                       Discharge Plan and Services                                     Social Determinants of Health (SDOH) Interventions     Readmission Risk Interventions     No data to display

## 2021-09-24 NOTE — Progress Notes (Signed)
PT Cancellation Note  Patient Details Name: [name] MRN: [id number] DOB: March 10, 1945   Cancelled Treatment:    Reason Eval/Treat Not Completed:  Attempted PT tx sesssion-pt declined participation. She reported she is hopeful to d/c to SNF today once cleared by MD.    Doreatha Massed, PT Acute Rehabilitation  Office: [phone] Pager: [phone]

## 2021-09-24 NOTE — Telephone Encounter (Signed)
Spoke to daughter [name]. Pt will be going to a skilled facility. I encouraged daughter to ask the provider at the facility if they can place her on an antidepressant while she is in rehab as I think this will be most helpful for her.  She stats she will discuss with the rehab. I will plan to see her after she leaves rehab.

## 2021-09-24 NOTE — Discharge Summary (Signed)
Physician Discharge Summary   Patient: [name] MRN: [id number] DOB: Nov 30, 1945  Admit date:     09/19/2021  Discharge date: 09/24/21  Discharge Physician: Karie Kirks   PCP: Debbrah Alar, NP   Recommendations at discharge:    Discharge to SNF Follow up with PCP in 7-10 days after discharge from facility. Patient should have chemistry and CBC drawn one week from today. Results to be reported to facility physician.  Discharge Diagnoses: Principal Problem:   GI bleed Active Problems:   Hypotension   Hypokalemia   Chronic kidney disease, stage 3a (HCC)   Depression   Symptomatic anemia   Debility  Resolved Problems:   * No resolved hospital problems. *  Hospital Course: [name] is a 76 y.o. female with medical history significant for HTN, CKD stage IIIa who is admitted with hypotension with suspected GI bleeding and severe hypokalemia.  She was given IV fluid resuscitation.   The patient was admitted to a telemetry. Dr. Benson Norway was consulted for GI. EGD was performed on 09/21/2021. It demonstrated esophagitis and duodenitis. The patient was continued on protonix. Her hemoglobin has remained stable.  The patient will be discharged to SNF today in fair condition.  Assessment and Plan: * GI bleed Hemoglobin dropped from 10.5-8.7 while holding in the ED.  FOBT was positive.  Patient has not seen any frank bleeding other than spots from her hemorrhoid and has not seen any melena.  Colonoscopy 06/16/2019 showed diverticulosis with multiple polyps.  Became hypotensive in the ED, responding to IV fluids at this point. Pt underwent EGD on 09/21/2021. This has demonstrated gastritis and duodenitis. She was restarted on a diet. She has been continued on protonix as recommended by Dr. Benson Norway. Will continue to monitor hemoglobin.  Hemoglobin is stable at 10.2. Continue to monitor.  Hypotension Potentially from GI bleeding but also in setting of poor oral intake and  continued antihypertensive use. -Continue IV fluids as above -Hold home Toprol-XL and Micardis. The patient is now normotensive without home antihypertensives. Will restart them as warranted.  Hypokalemia Resolved. Monitor.   Debility PT/OT have recommended SNF. TOC has been consulted.  Symptomatic anemia Noted.   Depression Grief since the passing of her husband last September now with significant depression symptoms over the last 3 and half months.  Chronic kidney disease, stage 3a ([place]) Renal function stable, continue to monitor.   Consultants: Gastroenterology Procedures performed: EGD  Disposition: Skilled nursing facility Diet recommendation:  Discharge Diet Orders (From admission, onward)     Start     Ordered   09/24/21 0000  Diet - low sodium heart healthy        09/24/21 1236           Cardiac diet DISCHARGE MEDICATION: Allergies as of 09/24/2021       Reactions   Atorvastatin    Muscle cramps   Fenofibrate    "Locked her muscles up"   Hydralazine    "kidney pain"   Latex Rash   Or just redness        Medication List     STOP taking these medications    acyclovir 200 MG capsule Commonly known as: Zovirax   Shingrix injection Generic drug: Zoster Vaccine Adjuvanted   telmisartan-hydrochlorothiazide 80-25 MG tablet Commonly known as: MICARDIS HCT       TAKE these medications    CALCIUM + D PO Take 2 tablets by mouth daily.   estradiol 0.5 MG tablet Commonly known as:  ESTRACE Take 1/2 tab po daily. What changed:  how much to take how to take this when to take this   feeding supplement Liqd Take 237 mLs by mouth 3 (three) times daily between meals.   folic acid 1 MG tablet Commonly known as: FOLVITE Take 1 tablet (1 mg total) by mouth daily. Start taking on: September 25, 2021   HYDROcodone-acetaminophen 5-325 MG tablet Commonly known as: NORCO/VICODIN Take 1 tablet by mouth every 6 (six) hours as needed for severe pain.    hydrocortisone 2.5 % rectal cream Commonly known as: ANUSOL-HC Place rectally 3 (three) times daily.   KRILL OIL PO Take 1 tablet by mouth daily.   metoprolol succinate 100 MG 24 hr tablet Commonly known as: TOPROL-XL Take 1 tablet (100 mg total) by mouth daily. Take with or immediately following a meal.   multivitamin tablet Take 1 tablet by mouth daily.   pantoprazole 40 MG tablet Commonly known as: PROTONIX Take 1 tablet (40 mg total) by mouth daily. Start taking on: September 25, 2021   vitamin C 500 MG tablet Commonly known as: ASCORBIC ACID Take 500 mg by mouth daily.        Contact information for after-discharge care     [facility] .   Service: Skilled Nursing Contact information: [address] [place] [phone]                    Discharge Exam: Filed Weights   09/20/21 1533 09/21/21 0500 09/21/21 1218  Weight: 64.7 kg 67.8 kg 61.7 kg   Vitals:   09/24/21 0408 09/24/21 1051  BP: 117/87 126/84  Pulse: 64 60  Resp: 16   Temp: 97.7 F (36.5 C)   SpO2: 98%    Exam:  Constitutional:  The patient is awake, alert, and oriented x 3. No acute distress. Eyes:  pupils and irises appear normal Normal lids and conjunctivae ENMT:  grossly normal hearing  Lips appear normal external ears, nose appear normal Oropharynx: mucosa, tongue,posterior pharynx appear normal Neck:  neck appears normal, no masses, normal ROM, supple no thyromegaly Respiratory:  No increased work of breathing. No wheezes, rales, or rhonchi No tactile fremitus Cardiovascular:  Regular rate and rhythm No murmurs, ectopy, or gallups. No lateral PMI. No thrills. Abdomen:  Abdomen is soft, non-tender, non-distended No hernias, masses, or organomegaly Normoactive bowel sounds.  Musculoskeletal:  No cyanosis, clubbing, or edema Skin:  No rashes, lesions, ulcers palpation of skin:  no induration or nodules Neurologic:  CN 2-12 intact Sensation all 4 extremities intact Psychiatric:  Mental status Mood, affect appropriate Orientation to person, place, time  judgment and insight appear intact   Condition at discharge: fair  The results of significant diagnostics from this hospitalization (including imaging, microbiology, ancillary and laboratory) are listed below for reference.   Imaging Studies: CT Head Wo Contrast  Result Date: 09/19/2021 CLINICAL DATA:  Lightheaded EXAM: CT HEAD WITHOUT CONTRAST TECHNIQUE: Contiguous axial images were obtained from the base of the skull through the vertex without intravenous contrast. RADIATION DOSE REDUCTION: This exam was performed according to the departmental dose-optimization program which includes automated exposure control, adjustment of the mA and/or kV according to patient size and/or use of iterative reconstruction technique. COMPARISON:  None Available. FINDINGS: Brain: No evidence of acute infarction, hemorrhage, cerebral edema, mass, mass effect, or midline shift. No hydrocephalus or extra-axial fluid collection. Periventricular white matter changes, likely the  sequela of chronic small vessel ischemic disease. Vascular: No hyperdense vessel. Skull: Normal. Negative for fracture or focal lesion. Sinuses/Orbits: No acute finding. Other: The mastoid air cells are well aerated. IMPRESSION: No acute intracranial process. Electronically Signed   By: Merilyn Baba M.D.   On: 09/19/2021 13:17   DG Chest Port 1 View  Result Date: 09/19/2021 CLINICAL DATA:  76 year old female with hypotension and lightheadedness EXAM: PORTABLE CHEST 1 VIEW COMPARISON:  None Available. FINDINGS: No focal consolidation, pleural effusion, or pneumothorax. Normal cardiomediastinal silhouette. Aortic atherosclerotic calcification. IMPRESSION: No active disease. Electronically Signed   By: Placido Sou M.D.   On: 09/19/2021 12:57     Microbiology: Results for orders placed or performed in visit on 05/17/20  Urine Culture     Status: None   Collection Time: 05/17/20  1:26 PM   Specimen: Urine  Result Value Ref Range Status   MICRO NUMBER: [id number]  Final   SPECIMEN QUALITY: Adequate  Final   Sample Source NOT GIVEN  Final   STATUS: FINAL  Final   ISOLATE 1:   Final    Mixed genital flora isolated. These superficial bacteria are not indicative of a urinary tract infection. No further organism identification is warranted on this specimen. If clinically indicated, recollect clean-catch, mid-stream urine and transfer  immediately to Urine Culture Transport Tube.     Labs: CBC: Recent Labs  Lab 09/19/21 1233 09/19/21 1909 09/20/21 0520 09/20/21 1527 09/20/21 1709 09/21/21 0946 09/21/21 1126 09/21/21 2106 09/22/21 0402 09/22/21 0726 09/23/21 0501  WBC 7.3 8.0 4.6 6.2  --  5.6  --   --   --   --  4.5  NEUTROABS 4.3  --   --   --   --  2.6  --   --   --   --  2.1  HGB 10.5* 8.7* 8.6* 10.4*  10.4*   < > 11.0* 10.8* 9.5* 11.1* 10.5* 10.2*  HCT 31.1* 25.8* 26.1* 31.3*  31.2*   < > 32.9* 33.2* 28.7* 32.3* 32.1* 31.0*  MCV 103.3* 103.6* 107.9* 101.6*  --  101.9*  --   --   --   --  103.7*  PLT 351 305 323 341  --  294  --   --   --   --  278   < > = values in this interval not displayed.   Basic Metabolic Panel: Recent Labs  Lab 09/19/21 1233 09/19/21 1431 09/19/21 1846 09/20/21 0520 09/21/21 0946 09/23/21 0501  NA 133*  --  134* 143 140 141  K <2.0*  --  2.8* 4.3 5.1 4.1  CL 89*  --  96* 106 106 106  CO2 30  --  '31 29 25 28  '$ GLUCOSE 168*  --  167* 109* 99 123*  BUN 11  --  11 9 6* 9  CREATININE 1.20*  --  1.25* 0.98 0.92 1.17*  CALCIUM 8.2*  --  7.6* 7.9* 8.5* 8.3*  MG  --  1.1* 1.9 1.6*  --   --    Liver Function Tests: Recent Labs  Lab 09/19/21 1233 09/20/21 0520  AST 21 14*  ALT 6 5  ALKPHOS 35* 26*  BILITOT 0.9 0.5  PROT 6.1* 4.7*  ALBUMIN 2.7* 2.0*   CBG: Recent Labs  Lab  09/20/21 2348  GLUCAP 107*    Discharge time spent: greater than 30 minutes.  Signed: Nachman Sundt, DO [facility] 09/24/2021

## 2021-10-02 ENCOUNTER — Ambulatory Visit: Payer: Medicare PPO | Admitting: Family

## 2021-10-02 ENCOUNTER — Ambulatory Visit (HOSPITAL_BASED_OUTPATIENT_CLINIC_OR_DEPARTMENT_OTHER)
Admission: RE | Admit: 2021-10-02 | Discharge: 2021-10-02 | Disposition: A | Discharge status: Home / Self Care | Payer: Medicare PPO | Source: Ambulatory Visit | Attending: Family | Admitting: Family

## 2021-10-02 VITALS — BP 123/81 | HR 66 | Wt 137.6 lb

## 2021-10-02 DIAGNOSIS — I10 Essential (primary) hypertension: Secondary | ICD-10-CM

## 2021-10-02 DIAGNOSIS — R600 Localized edema: Secondary | ICD-10-CM | POA: Diagnosis not present

## 2021-10-02 DIAGNOSIS — N1831 Chronic kidney disease, stage 3a: Secondary | ICD-10-CM

## 2021-10-02 DIAGNOSIS — M7989 Other specified soft tissue disorders: Secondary | ICD-10-CM | POA: Diagnosis not present

## 2021-10-02 DIAGNOSIS — K922 Gastrointestinal hemorrhage, unspecified: Secondary | ICD-10-CM

## 2021-10-02 DIAGNOSIS — E440 Moderate protein-calorie malnutrition: Secondary | ICD-10-CM | POA: Diagnosis not present

## 2021-10-02 DIAGNOSIS — R5381 Other malaise: Secondary | ICD-10-CM | POA: Diagnosis not present

## 2021-10-02 DIAGNOSIS — E876 Hypokalemia: Secondary | ICD-10-CM

## 2021-10-02 DIAGNOSIS — F32A Depression, unspecified: Secondary | ICD-10-CM | POA: Diagnosis not present

## 2021-10-02 LAB — CBC WITH DIFFERENTIAL/PLATELET
Basophils Absolute: 0.1 10*3/uL (ref 0.0–0.1)
Basophils Relative: 1.2 % (ref 0.0–3.0)
Eosinophils Absolute: 0.1 10*3/uL (ref 0.0–0.7)
Eosinophils Relative: 1.6 % (ref 0.0–5.0)
HCT: 31.4 % — ABNORMAL LOW (ref 36.0–46.0)
Hemoglobin: 10.3 g/dL — ABNORMAL LOW (ref 12.0–15.0)
Lymphocytes Relative: 35.9 % (ref 12.0–46.0)
Lymphs Abs: 1.9 10*3/uL (ref 0.7–4.0)
MCHC: 32.8 g/dL (ref 30.0–36.0)
MCV: 102.9 fl — ABNORMAL HIGH (ref 78.0–100.0)
Monocytes Absolute: 0.4 10*3/uL (ref 0.1–1.0)
Monocytes Relative: 8.2 % (ref 3.0–12.0)
Neutro Abs: 2.8 10*3/uL (ref 1.4–7.7)
Neutrophils Relative %: 53.1 % (ref 43.0–77.0)
Platelets: 392 10*3/uL (ref 150.0–400.0)
RBC: 3.06 Mil/uL — ABNORMAL LOW (ref 3.87–5.11)
RDW: 19 % — ABNORMAL HIGH (ref 11.5–15.5)
WBC: 5.3 10*3/uL (ref 4.0–10.5)

## 2021-10-02 LAB — COMPREHENSIVE METABOLIC PANEL
ALT: 5 U/L (ref 0–35)
AST: 18 U/L (ref 0–37)
Albumin: 2.9 g/dL — ABNORMAL LOW (ref 3.5–5.2)
Alkaline Phosphatase: 33 U/L — ABNORMAL LOW (ref 39–117)
BUN: 15 mg/dL (ref 6–23)
CO2: 28 mEq/L (ref 19–32)
Calcium: 9 mg/dL (ref 8.4–10.5)
Chloride: 105 mEq/L (ref 96–112)
Creatinine, Ser: 1.01 mg/dL (ref 0.40–1.20)
GFR: 55.15 mL/min — ABNORMAL LOW (ref >60.00)
Glucose, Bld: 127 mg/dL — ABNORMAL HIGH (ref 70–99)
Potassium: 4.7 mEq/L (ref 3.5–5.1)
Sodium: 141 mEq/L (ref 135–145)
Total Bilirubin: 0.4 mg/dL (ref 0.2–1.2)
Total Protein: 5.6 g/dL — ABNORMAL LOW (ref 6.0–8.3)

## 2021-10-02 MED ORDER — ESCITALOPRAM OXALATE 10 MG PO TABS: ORAL_TABLET | ORAL | 0 refills | Status: DC

## 2021-10-02 MED ORDER — FLUOXETINE HCL 20 MG PO TABS: ORAL_TABLET | ORAL | 0 refills | Status: DC

## 2021-10-02 NOTE — Assessment & Plan Note (Signed)
Low albumin level. We discussed importance of incorporating regular protein in her diet and staying well hydrated.

## 2021-10-02 NOTE — Progress Notes (Signed)
Subjective:   By signing my name below, I, [name], attest that this documentation has been prepared under the direction and in the presence of Karie Chimera, NP 10/02/2021     Patient ID: [name], female    DOB: 03-07-1945, 76 y.o.   MRN: [id number]  Chief Complaint  Patient presents with   Follow-up    Here for er follow up   Foot Swelling    Patient reports foot swelling    HPI Patient is in today for an office visit. She is accompanied by her daughter.   Last visit pt had severe weakness and severe hypotension. We sent her to the ED where she was admitted. She was diagnosed with GI bleed and severe hypokalemia. She was admitted to the hospital 7/19-7/24.  She reports that she was in rehab for about 7 days.  and physical therapy for 2 - 3 days. There is going to be an at home caregiver and at home physical therapist. She enjoys drinking water and consuming some sport drinks but does not drink them often due to its sugar content.  Feet and Leg Swelling: She complains of swelling in her lower extremities.  Knots in Left Leg: She complains of knots on her left leg. She denies of any pain in the areas  Depression: She reports that since being discharged from the ED, she has bouts of depression. Her family member reports that she is still a bit teary. She states that she has not taken depression medications in the past. She is interested in being set up with a Education officer, museum for grief counseling.  Potassium: Her potassium levels are normal. She reports that her potassium levels were measured while in the ED. Her family member states that the patient is increasing her potassium intake but she is not eating much of the food. She is interested in starting Meals on Wheels.  Lab Results  Component Value Date   K 4.1 09/23/2021   Blood Pressure: She is currently taking 100 Mg of Metoprolol Succinate. She reports that during her time in rehab, her blood pressure readings  were normal. BP Readings from Last 3 Encounters:  10/02/21 123/81  09/24/21 126/84  09/19/21 (!) 68/45   Pulse Readings from Last 3 Encounters:  10/02/21 66  09/24/21 60  09/19/21 80   Weight: She reports that she is gaining weight. Weight in the rehab got down to 134lbs. Wt Readings from Last 3 Encounters:  10/02/21 137 lb 9.6 oz (62.4 kg)  09/21/21 136 lb (61.7 kg)  02/09/21 147 lb (66.7 kg)   Health Maintenance Due  Topic Date Due   Zoster Vaccines- Shingrix (1 of 2) Never done   COVID-19 Vaccine (3 - Pfizer series) 07/28/2019   Pneumonia Vaccine 15+ Years old (2 - PCV) 11/20/2019   MAMMOGRAM  07/20/2021   INFLUENZA VACCINE  10/02/2021    Past Medical History:  Diagnosis Date   Cervical dysplasia    Endometrial hyperplasia    Fibroid    Atypical leiomyoma   History of colon polyps    Hyperglycemia 05/23/2014   Hyperglycemia    Hypertension     Past Surgical History:  Procedure Laterality Date   ABDOMINAL HYSTERECTOMY  2004   TAH_BSO   BIOPSY  09/21/2021   Procedure: BIOPSY;  Surgeon: Carol Ada, MD;  Location: [facility];  Service: Gastroenterology;;   BREAST BIOPSY Left 2000   BREAST BIOPSY Bilateral 1999   BREAST EXCISIONAL BIOPSY Left  benign   BREAST SURGERY  1998   Benign left breast lump   COLPOSCOPY     ESOPHAGOGASTRODUODENOSCOPY (EGD) WITH PROPOFOL N/A 09/21/2021   Procedure: ESOPHAGOGASTRODUODENOSCOPY (EGD) WITH PROPOFOL;  Surgeon: Carol Ada, MD;  Location: [facility];  Service: Gastroenterology;  Laterality: N/A;   EXCISION MASS LOWER EXTREMETIES Right 05/30/2016   Procedure: EXCISION RIGHT THIGH MASS;  Surgeon: Erroll Luna, MD;  Location: [facility];  Service: General;  Laterality: Right;  EXCISION RIGHT THIGH MASS   HEMORRHOID SURGERY  1980's   LYMPH NODE BIOPSY Left 1975   ? "removed lymph nodes in left leg"--benign per pt   OOPHORECTOMY     BSO   TONSILLECTOMY  1990    Family History  Problem Relation Age of Onset   Hypertension  Mother    Heart disease Mother    Diabetes Mother    Breast cancer Mother 79   Heart disease Father    Cancer Father        prostate   Cancer Sister 108       Leukemia, deceased   Breast cancer Maternal Aunt        over 25   Breast cancer Maternal Grandmother        over 80   Hypertension Brother    Diabetes Brother    Stroke Brother     Social History   Socioeconomic History   Marital status: Widowed    Spouse name: Not on file   Number of children: Not on file   Years of education: Not on file   Highest education level: Not on file  Occupational History   Not on file  Tobacco Use   Smoking status: Former    Packs/day: 0.25    Years: 30.00    Total pack years: 7.50    Types: Cigarettes    Quit date: 2013    Years since quitting: 10.5   Smokeless tobacco: Never  Vaping Use   Vaping Use: Never used  Substance and Sexual Activity   Alcohol use: Yes    Alcohol/week: 0.0 standard drinks of alcohol    Comment: 2-4 glasses of wine on weekends   Drug use: No   Sexual activity: Yes    Birth control/protection: Surgical    Comment: 1st intercourse 76 yo-fewer than 5 partners  Other Topics Concern   Not on file  Social History Narrative   2 yrs of college (business Photographer)   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   Enjoys reading, yard work, cares for her mother during the day, her brother helps during then night, enjoys poker   1 daughter (in [place]) and 2 grand-daughters (In [place]- age 35 and 10)   Social Determinants of Health   Financial Resource Strain: [place]  (02/09/2021)   Overall Financial Resource Strain (CARDIA)    Difficulty of Paying Living Expenses: Not hard at all  Food Insecurity: No Food Insecurity (02/09/2021)   Hunger Vital Sign    Worried About Running Out of Food in the Last Year: Never true    [place] in the Last Year: Never true  Transportation Needs: No  Transportation Needs (02/09/2021)   PRAPARE - Hydrologist (Medical): No    Lack of Transportation (Non-Medical): No  Physical Activity: Insufficiently Active (02/09/2021)   Exercise Vital Sign    Days of Exercise  per Week: 1 day    Minutes of Exercise per Session: 30 min  Stress: No Stress Concern Present (02/09/2021)   [place]    Feeling of Stress : Not at all  Social Connections: Moderately Isolated (02/09/2021)   Social Connection and Isolation Panel [NHANES]    Frequency of Communication with Friends and Family: More than three times a week    Frequency of Social Gatherings with Friends and Family: More than three times a week    Attends Religious Services: Never    Marine scientist or Organizations: Yes    Attends Music therapist: More than 4 times per year    Marital Status: Widowed  Intimate Partner Violence: Not At Risk (02/09/2021)   Humiliation, Afraid, Rape, and Kick questionnaire    Fear of Current or Ex-Partner: No    Emotionally Abused: No    Physically Abused: No    Sexually Abused: No    Outpatient Medications Prior to Visit  Medication Sig Dispense Refill   Calcium Carbonate-Vitamin D (CALCIUM + D PO) Take 2 tablets by mouth daily.      estradiol (ESTRACE) 0.5 MG tablet Take 1/2 tab po daily. (Patient taking differently: Take 0.25 mg by mouth daily. Take 1/2 tab po daily.) 45 tablet 0   feeding supplement (ENSURE ENLIVE / ENSURE PLUS) LIQD Take 237 mLs by mouth 3 (three) times daily between meals. 824 mL 12   folic acid (FOLVITE) 1 MG tablet Take 1 tablet (1 mg total) by mouth daily. 30 tablet 0   HYDROcodone-acetaminophen (NORCO/VICODIN) 5-325 MG tablet Take 1 tablet by mouth every 6 (six) hours as needed for severe pain. 20 tablet 0   hydrocortisone (ANUSOL-HC) 2.5 % rectal cream Place rectally 3 (three) times daily. 30 g 0   KRILL OIL PO Take 1 tablet  by mouth daily.     metoprolol succinate (TOPROL-XL) 100 MG 24 hr tablet Take 1 tablet (100 mg total) by mouth daily. Take with or immediately following a meal. 90 tablet 1   Multiple Vitamin (MULTIVITAMIN) tablet Take 1 tablet by mouth daily.     pantoprazole (PROTONIX) 40 MG tablet Take 1 tablet (40 mg total) by mouth daily. 30 tablet 0   vitamin C (ASCORBIC ACID) 500 MG tablet Take 500 mg by mouth daily.     No facility-administered medications prior to visit.    Allergies  Allergen Reactions   Atorvastatin     Muscle cramps   Fenofibrate     "Locked her muscles up"   Hydralazine     "kidney pain"   Latex Rash    Or just redness     Review of Systems  Musculoskeletal:        (+) RLE and LLE swelling (+) Knots in Left Legs       Objective:    Physical Exam Constitutional:      General: She is not in acute distress.    Appearance: Normal appearance. She is not ill-appearing.  HENT:     Head: Normocephalic and atraumatic.     Right Ear: External ear normal.     Left Ear: External ear normal.  Eyes:     Extraocular Movements: Extraocular movements intact.     Pupils: Pupils are equal, round, and reactive to light.  Cardiovascular:     Rate and Rhythm: Normal rate and regular rhythm.     Heart sounds: Normal heart sounds. No murmur heard.  No gallop.  Pulmonary:     Effort: Pulmonary effort is normal. No respiratory distress.     Breath sounds: Normal breath sounds. No wheezing or rales.  Musculoskeletal:     Right lower leg: Edema present.     Left lower leg: Edema present.  Skin:    General: Skin is warm and dry.  Neurological:     Mental Status: She is alert and oriented to person, place, and time.  Psychiatric:        Mood and Affect: Mood normal.        Behavior: Behavior normal.        Judgment: Judgment normal.     BP 123/81   Pulse 66   Wt 137 lb 9.6 oz (62.4 kg)   LMP 03/04/2002   SpO2 100%   BMI 23.62 kg/m  Wt Readings from Last 3  Encounters:  10/02/21 137 lb 9.6 oz (62.4 kg)  09/21/21 136 lb (61.7 kg)  02/09/21 147 lb (66.7 kg)       Assessment & Plan:   Problem List Items Addressed This Visit       Unprioritized   Moderate protein-calorie malnutrition (HCC)    Low albumin level. We discussed importance of incorporating regular protein in her diet and staying well hydrated.       Hypokalemia   Relevant Orders   Comp Met (CMET)   GI bleed    Clinically resolved. Obtain follow up cbc.      Relevant Orders   CBC with Differential/Platelet   Essential hypertension, benign    BP Readings from Last 3 Encounters:  10/02/21 123/81  09/24/21 126/84  09/19/21 (!) 68/45  BP looks good today on toprol xl. Micardis was stopped in the hospital and I don't think that it needs to be resumed at this time.       Depression    Uncontrolled. Will begin lexapro 33m nightly for 2 weeks, then increase to a full tab once daily on week three. Referral made for grief counseling with SW. She is also interested in Meals on wheels.       Relevant Medications   escitalopram (LEXAPRO) 10 MG tablet   Other Relevant Orders   AMB Referral to [facility]   She has a caregiver coming during the day to help with ADL's as well as Home Health PT/RN.       Chronic kidney disease, stage 3a ([place]    Obtain follow up cr.       Other Visit Diagnoses     Leg swelling    -  Primary   Relevant Orders   [place]Venous Img Lower Unilateral Left      Meds ordered this encounter  Medications   DISCONTD: FLUoxetine (PROZAC) 20 MG tablet    Sig: 1/2 tab once daily for 2 weeks, then increase to a full tab once daily on week 3    Dispense:  30 tablet    Refill:  0    Order Specific Question:   Supervising Provider    Answer:   BPenni HomansA [[id number]]   escitalopram (LEXAPRO) 10 MG tablet    Sig: 1/2 tab by mouth once daily for 2 weeks, then increase to a full tab once daily on week two    Dispense:  30  tablet    Refill:  0    Order Specific Question:   Supervising Provider    Answer:   BPenni Homans  A [[id number]]    I, Nance Pear, NP, personally preformed the services described in this documentation.  All medical record entries made by the scribe were at my direction and in my presence.  I have reviewed the chart and discharge instructions (if applicable) and agree that the record reflects my personal performance and is accurate and complete. 10/02/2021   I,Amber Collins,acting as a scribe for Nance Pear, NP.,have documented all relevant documentation on the behalf of Nance Pear, NP,as directed by  Nance Pear, NP while in the presence of Nance Pear, NP.   Nance Pear, NP

## 2021-10-02 NOTE — Patient Instructions (Signed)
Please start lexapro '10mg'$  1/2 tab once daily for 2 weeks, then increase to a full tab once daily on week two.

## 2021-10-02 NOTE — Assessment & Plan Note (Signed)
Uncontrolled. Will begin lexapro '5mg'$  nightly for 2 weeks, then increase to a full tab once daily on week three. Referral made for grief counseling with SW. She is also interested in Meals on wheels.

## 2021-10-02 NOTE — Assessment & Plan Note (Signed)
>>  ASSESSMENT AND PLAN FOR ESSENTIAL HYPERTENSION, BENIGN WRITTEN ON 10/02/2021  1:10 PM BY O'SULLIVAN, Susan Bleich, NP  BP Readings from Last 3 Encounters:  10/02/21 123/81  09/24/21 126/84  09/19/21 (!) 68/45   BP looks good today on toprol xl. Micardis was stopped in the hospital and I don't think that it needs to be resumed at this time.

## 2021-10-02 NOTE — Assessment & Plan Note (Signed)
BP Readings from Last 3 Encounters:  10/02/21 123/81  09/24/21 126/84  09/19/21 (!) 68/45   BP looks good today on toprol xl. Micardis was stopped in the hospital and I don't think that it needs to be resumed at this time.

## 2021-10-02 NOTE — Assessment & Plan Note (Signed)
Clinically resolved. Obtain follow up cbc.

## 2021-10-02 NOTE — Assessment & Plan Note (Signed)
Obtain follow up cr.

## 2021-10-02 NOTE — Assessment & Plan Note (Signed)
She has a caregiver coming during the day to help with ADL's as well as Home Health PT/RN.

## 2021-10-03 ENCOUNTER — Telehealth: Payer: Self-pay | Admitting: *Deleted

## 2021-10-03 ENCOUNTER — Ambulatory Visit: Payer: Self-pay | Admitting: Licensed Clinical Social Worker

## 2021-10-03 NOTE — Chronic Care Management (AMB) (Signed)
  Care Coordination   Note   10/03/2021 Name: [name] MRN: [id number] DOB: 20-Aug-1945  [name] is a 76 y.o. year old female who sees Debbrah Alar, NP for primary care. I reached out to Carlyon Shadow by phone today to offer care coordination services.  [name] was given information about Care Coordination services today including:   The Care Coordination services include support from the care team which includes your Nurse Coordinator, Clinical Social Worker, or Pharmacist.  The Care Coordination team is here to help remove barriers to the health concerns and goals most important to you. Care Coordination services are voluntary, and the patient may decline or stop services at any time by request to their care team member.   Care Coordination Consent Status: Patient agreed to services and verbal consent obtained.   Follow up plan:  Telephone appointment with care coordination team member scheduled for:  10/03/2021  Encounter Outcome:  Pt. Scheduled  Julian Hy, [place] Direct Dial: [phone]

## 2021-10-03 NOTE — Patient Outreach (Signed)
  Care Coordination  Initial Visit Note   10/03/2021 Name: [name] MRN: [id number] DOB: 1945-11-25  [name] is a 76 y.o. year old female who sees Debbrah Alar, NP for primary care. I spoke with  Carlyon Shadow by phone today  What matters to the patients health and wellness today?  Feeling better with physical and mental health  : Patient was accompanied by her daughter [name] who provided information during this encounter. She is experiencing symptoms of  Grief which seems to be exacerbated by the loss of her husband, loss of independence with her physical heath..  Recommendation: Patient may benefit from, and is in agreement to To start counseling to assist with her grief process and finding her new normal.     Goals Addressed             This Visit's Progress    Start to feel better       Care Coordination Interventions: Motivational Interviewing employed Solution-Focused Strategies employed:  Active listening / Reflection utilized  Behavioral Activation reviewed Reviewed mental health medications and discussed importance of compliance: just started taking Lexapro  Participation in counseling encouraged  Offered EMMI education information  :Patient declined Discussed counseling options :  will review provider on psychology Today.        SDOH assessments and interventions completed:  Yes  SDOH Interventions Today    Flowsheet Row Most Recent Value  SDOH Interventions   SDOH Interventions for the Following Domains Food Insecurity  Food Insecurity Interventions Other (Comment)  [referral to care guide for meals on wheels]  Social Connections Interventions Patient Refused  [at this time]  Depression Interventions/Treatment  Counseling, Medication        Care Coordination Interventions Activated:  Yes  Care Coordination Interventions:  Yes, provided   Follow up plan: Follow up call scheduled for Aug. 9th at 3:30    Encounter Outcome:  Pt.  Visit Completed   [name], [place] [phone]

## 2021-10-03 NOTE — Patient Instructions (Signed)
Visit Information  Thank you for taking time to visit with me today. Please don't hesitate to contact me if I can be of assistance to you.   Following are the goals we discussed today:   Goals Addressed             This Visit's Progress    Start to feel better       Care Coordination Interventions: Motivational Interviewing employed Solution-Focused Strategies employed:  Active listening / Reflection utilized  Behavioral Activation reviewed Reviewed mental health medications and discussed importance of compliance: just started taking Lexapro  Participation in counseling encouraged  Offered EMMI education information  :Patient declined Discussed counseling options :  will review provider on psychology Today.        Our next appointment is by telephone on Aug. 9th at 3:30  Please call the care guide team at [phone] if you need to cancel or reschedule your appointment.   If you are experiencing a Mental Health or [place] or need someone to talk to, please call 1-800-273-TALK (toll free, 24 hour hotline)   Casimer Lanius, [place] [phone]

## 2021-10-10 ENCOUNTER — Ambulatory Visit: Payer: Self-pay | Admitting: Licensed Clinical Social Worker

## 2021-10-10 NOTE — Patient Outreach (Signed)
  Care Coordination  Follow Up Visit Note   10/10/2021 Name: NDIA SAMPATH MRN: [id number] DOB: 04-02-1945  Campbell Stall Fife is a 76 y.o. year old female who sees Debbrah Alar, NP for primary care. I spoke with  Carlyon Shadow by phone today  What matters to the patients health and wellness today?  Working  on her physical health  Patient was accompanied by her daughter [name] who provided information during this encounter. Patient reports is making progress with managing grief and no longer wants therapy .   Recommendation: Patient may benefit from, and is in agreement to Explore therapy options and have them on standby in the event she changes her mind.     Goals Addressed             This Visit's Progress    COMPLETED: Start to feel better       Care Coordination Interventions: Motivational Interviewing employed Solution-Focused Strategies employed:  Participation in counseling encouraged  No longer wants counseling Discussed counseling options in case she changes her mind :  review providers on psychology Today.        SDOH assessments and interventions completed:  No  Care Coordination Interventions Activated:  Yes  Care Coordination Interventions:  Yes, provided   Follow up plan: No further intervention required. Byr Care Coordination at this time.  Encounter Outcome:  Pt. Visit Completed   Casimer Lanius, [place] [phone]

## 2021-10-10 NOTE — Patient Instructions (Signed)
Visit Information  Thank you for taking time to visit with me today. Please don't hesitate to contact me if I can be of assistance to you.   Following are the goals we discussed today: managing grief  Goals Addressed             This Visit's Progress    COMPLETED: Start to feel better       Care Coordination Interventions: Motivational Interviewing employed Solution-Focused Strategies employed:  Participation in counseling encouraged  No longer wants counseling Discussed counseling options in case she changes her mind :  review providers on psychology Today.        Please call the care guide team at [phone] if you need to schedule an appointment with me.   If you are experiencing a Mental Health or [place] or need someone to talk to, please call 1-800-273-TALK (toll free, 24 hour hotline)   Patient verbalizes understanding of instructions and care plan provided today and agrees to view in [place]. Active MyChart status and patient understanding of how to access instructions and care plan via MyChart confirmed with patient.     No further follow up required: by Care Coordination at this time  Casimer Lanius, [place] [phone]

## 2021-10-22 DIAGNOSIS — F32A Depression, unspecified: Secondary | ICD-10-CM

## 2021-10-22 DIAGNOSIS — D631 Anemia in chronic kidney disease: Secondary | ICD-10-CM | POA: Diagnosis not present

## 2021-10-22 DIAGNOSIS — K2980 Duodenitis without bleeding: Secondary | ICD-10-CM | POA: Diagnosis not present

## 2021-10-22 DIAGNOSIS — K635 Polyp of colon: Secondary | ICD-10-CM | POA: Diagnosis not present

## 2021-10-22 DIAGNOSIS — K209 Esophagitis, unspecified without bleeding: Secondary | ICD-10-CM | POA: Diagnosis not present

## 2021-10-22 DIAGNOSIS — K649 Unspecified hemorrhoids: Secondary | ICD-10-CM

## 2021-10-22 DIAGNOSIS — K5730 Diverticulosis of large intestine without perforation or abscess without bleeding: Secondary | ICD-10-CM | POA: Diagnosis not present

## 2021-10-22 DIAGNOSIS — Z9181 History of falling: Secondary | ICD-10-CM

## 2021-10-22 DIAGNOSIS — Z86018 Personal history of other benign neoplasm: Secondary | ICD-10-CM

## 2021-10-22 DIAGNOSIS — K2970 Gastritis, unspecified, without bleeding: Secondary | ICD-10-CM | POA: Diagnosis not present

## 2021-10-22 DIAGNOSIS — N1831 Chronic kidney disease, stage 3a: Secondary | ICD-10-CM | POA: Diagnosis not present

## 2021-10-22 DIAGNOSIS — I129 Hypertensive chronic kidney disease with stage 1 through stage 4 chronic kidney disease, or unspecified chronic kidney disease: Secondary | ICD-10-CM | POA: Diagnosis not present

## 2021-10-22 DIAGNOSIS — I959 Hypotension, unspecified: Secondary | ICD-10-CM | POA: Diagnosis not present

## 2021-10-23 ENCOUNTER — Telehealth: Payer: Self-pay

## 2021-10-23 NOTE — Telephone Encounter (Signed)
Plan of care signed and faxed back to [facility] at [phone]. Form sent for scanning.  

## 2021-10-26 ENCOUNTER — Other Ambulatory Visit: Payer: Self-pay | Admitting: Family

## 2021-11-30 ENCOUNTER — Other Ambulatory Visit: Payer: Self-pay | Admitting: Family

## 2021-11-30 DIAGNOSIS — Z1231 Encounter for screening mammogram for malignant neoplasm of breast: Secondary | ICD-10-CM

## 2021-12-03 ENCOUNTER — Other Ambulatory Visit: Payer: Self-pay | Admitting: Nurse Practitioner

## 2021-12-03 DIAGNOSIS — Z1231 Encounter for screening mammogram for malignant neoplasm of breast: Secondary | ICD-10-CM

## 2021-12-07 ENCOUNTER — Encounter: Payer: Self-pay | Admitting: Family

## 2021-12-07 ENCOUNTER — Ambulatory Visit: Payer: Medicare PPO | Admitting: Family

## 2021-12-07 VITALS — BP 130/90 | HR 95 | Temp 98.2°F | Resp 18 | Ht 64.0 in | Wt 128.8 lb

## 2021-12-07 DIAGNOSIS — Z23 Encounter for immunization: Secondary | ICD-10-CM

## 2021-12-07 DIAGNOSIS — E46 Unspecified protein-calorie malnutrition: Secondary | ICD-10-CM | POA: Diagnosis not present

## 2021-12-07 DIAGNOSIS — R5381 Other malaise: Secondary | ICD-10-CM

## 2021-12-07 DIAGNOSIS — I10 Essential (primary) hypertension: Secondary | ICD-10-CM

## 2021-12-07 DIAGNOSIS — K922 Gastrointestinal hemorrhage, unspecified: Secondary | ICD-10-CM | POA: Diagnosis not present

## 2021-12-07 DIAGNOSIS — I959 Hypotension, unspecified: Secondary | ICD-10-CM

## 2021-12-07 DIAGNOSIS — E440 Moderate protein-calorie malnutrition: Secondary | ICD-10-CM

## 2021-12-07 DIAGNOSIS — F329 Major depressive disorder, single episode, unspecified: Secondary | ICD-10-CM | POA: Diagnosis not present

## 2021-12-07 DIAGNOSIS — E538 Deficiency of other specified B group vitamins: Secondary | ICD-10-CM

## 2021-12-07 DIAGNOSIS — D649 Anemia, unspecified: Secondary | ICD-10-CM

## 2021-12-07 MED ORDER — TELMISARTAN-HCTZ 80-25 MG PO TABS: 1.0000 | ORAL_TABLET | Freq: Every day | ORAL | 1 refills | Status: DC

## 2021-12-07 MED ORDER — PANTOPRAZOLE SODIUM 40 MG PO TBEC: 40.0000 mg | DELAYED_RELEASE_TABLET | Freq: Every day | ORAL | 0 refills | Status: DC

## 2021-12-07 MED ORDER — METOPROLOL SUCCINATE ER 100 MG PO TB24: 100.0000 mg | ORAL_TABLET | Freq: Every day | ORAL | 1 refills | Status: DC

## 2021-12-07 NOTE — Assessment & Plan Note (Signed)
Stable without lexapro. Monitor.

## 2021-12-07 NOTE — Assessment & Plan Note (Signed)
BP looks ok today on toprol xl and micardis-hct.

## 2021-12-07 NOTE — Assessment & Plan Note (Signed)
She is cooking and eating more protein.

## 2021-12-07 NOTE — Progress Notes (Signed)
Subjective:   By signing my name below, I, [name], attest that this documentation has been prepared under the direction and in the presence of Karie Chimera, NP 12/07/2021     Patient ID: [name], female    DOB: 04/19/1945, 75 y.o.   MRN: [id number]  Chief Complaint  Patient presents with   Depression   Follow-up    HPI Patient is in today for an office visit  Refills: She is receiving a refill on 100 mg Toprol-XL and 80-25 mg Micardis HCT   Mood: She is experiencing crying bouts from losing a loved one,but trying to motivate herself and is doing alright. She is still taking 10 mg Lexapro for her depression.   Immunization: She is not interested in an influenza vaccine this visit, but is interested in receiving pneumonia vaccine. She is UTD on tetanus vaccine.  Mammogram: She has an upcoming mammogram appointment on 12/27/2021  Blood pressure: Blood pressure is normal this visit. She is still taking 100 mg Toprol-xl for blood pressure. BP Readings from Last 3 Encounters:  12/07/21 (!) 130/90  10/02/21 123/81  09/24/21 126/84    Pulse Readings from Last 3 Encounters:  12/07/21 95  10/02/21 66  09/24/21 60     Health Maintenance Due  Topic Date Due   Zoster Vaccines- Shingrix (1 of 2) Never done   COVID-19 Vaccine (3 - Pfizer series) 07/28/2019   MAMMOGRAM  07/20/2021   INFLUENZA VACCINE  Never done    Past Medical History:  Diagnosis Date   Cervical dysplasia    Endometrial hyperplasia    Fibroid    Atypical leiomyoma   History of colon polyps    Hyperglycemia 05/23/2014   Hyperglycemia    Hypertension     Past Surgical History:  Procedure Laterality Date   ABDOMINAL HYSTERECTOMY  2004   TAH_BSO   BIOPSY  09/21/2021   Procedure: BIOPSY;  Surgeon: Carol Ada, MD;  Location: [facility];  Service: Gastroenterology;;   BREAST BIOPSY Left 2000   BREAST BIOPSY Bilateral 1999   BREAST EXCISIONAL BIOPSY Left    benign   BREAST  SURGERY  1998   Benign left breast lump   COLPOSCOPY     ESOPHAGOGASTRODUODENOSCOPY (EGD) WITH PROPOFOL N/A 09/21/2021   Procedure: ESOPHAGOGASTRODUODENOSCOPY (EGD) WITH PROPOFOL;  Surgeon: Carol Ada, MD;  Location: [facility];  Service: Gastroenterology;  Laterality: N/A;   EXCISION MASS LOWER EXTREMETIES Right 05/30/2016   Procedure: EXCISION RIGHT THIGH MASS;  Surgeon: Erroll Luna, MD;  Location: [facility];  Service: General;  Laterality: Right;  EXCISION RIGHT THIGH MASS   HEMORRHOID SURGERY  1980's   LYMPH NODE BIOPSY Left 1975   ? "removed lymph nodes in left leg"--benign per pt   OOPHORECTOMY     BSO   TONSILLECTOMY  1990    Family History  Problem Relation Age of Onset   Hypertension Mother    Heart disease Mother    Diabetes Mother    Breast cancer Mother 71   Heart disease Father    Cancer Father        prostate   Cancer Sister 19       Leukemia, deceased   Breast cancer Maternal Aunt        over 40   Breast cancer Maternal Grandmother        over 24   Hypertension Brother    Diabetes Brother    Stroke Brother     Social History  Socioeconomic History   Marital status: Widowed    Spouse name: Not on file   Number of children: Not on file   Years of education: Not on file   Highest education level: Not on file  Occupational History   Not on file  Tobacco Use   Smoking status: Former    Packs/day: 0.25    Years: 30.00    Total pack years: 7.50    Types: Cigarettes    Quit date: 2013    Years since quitting: 10.7   Smokeless tobacco: Never  Vaping Use   Vaping Use: Never used  Substance and Sexual Activity   Alcohol use: Yes    Alcohol/week: 0.0 standard drinks of alcohol    Comment: 2-4 glasses of wine on weekends   Drug use: No   Sexual activity: Yes    Birth control/protection: Surgical    Comment: 1st intercourse 76 yo-fewer than 5 partners  Other Topics Concern   Not on file  Social History Narrative   2 yrs of college (business  Photographer)   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   Enjoys reading, yard work, cares for her mother during the day, her brother helps during then night, enjoys poker   1 daughter (in [place]) and 2 grand-daughters (In [place]- age 37 and 89)   Social Determinants of Health   Financial Resource Strain: [place]  (02/09/2021)   Overall Financial Resource Strain (CARDIA)    Difficulty of Paying Living Expenses: Not hard at all  Food Insecurity: No Food Insecurity (02/09/2021)   Hunger Vital Sign    Worried About Running Out of Food in the Last Year: Never true    [place] in the Last Year: Never true  Transportation Needs: No Transportation Needs (02/09/2021)   PRAPARE - Hydrologist (Medical): No    Lack of Transportation (Non-Medical): No  Physical Activity: Insufficiently Active (02/09/2021)   Exercise Vital Sign    Days of Exercise per Week: 1 day    Minutes of Exercise per Session: 30 min  Stress: No Stress Concern Present (02/09/2021)   [place]    Feeling of Stress : Not at all  Social Connections: Moderately Isolated (02/09/2021)   Social Connection and Isolation Panel [NHANES]    Frequency of Communication with Friends and Family: More than three times a week    Frequency of Social Gatherings with Friends and Family: More than three times a week    Attends Religious Services: Never    Marine scientist or Organizations: Yes    Attends Music therapist: More than 4 times per year    Marital Status: Widowed  Intimate Partner Violence: Not At Risk (02/09/2021)   Humiliation, Afraid, Rape, and Kick questionnaire    Fear of Current or Ex-Partner: No    Emotionally Abused: No    Physically Abused: No    Sexually Abused: No    Outpatient Medications Prior to Visit  Medication Sig Dispense  Refill   Calcium Carbonate-Vitamin D (CALCIUM + D PO) Take 2 tablets by mouth daily.      estradiol (ESTRACE) 0.5 MG tablet Take 1/2 tab po daily. (Patient taking differently: Take 0.25 mg by mouth daily. Take 1/2 tab po daily.) 45 tablet 0   feeding supplement (ENSURE ENLIVE /  ENSURE PLUS) LIQD Take 237 mLs by mouth 3 (three) times daily between meals. 944 mL 12   folic acid (FOLVITE) 1 MG tablet Take 1 tablet (1 mg total) by mouth daily. 30 tablet 0   KRILL OIL PO Take 1 tablet by mouth daily.     Multiple Vitamin (MULTIVITAMIN) tablet Take 1 tablet by mouth daily.     vitamin C (ASCORBIC ACID) 500 MG tablet Take 500 mg by mouth daily.     escitalopram (LEXAPRO) 10 MG tablet Take 1 tablet (10 mg total) by mouth daily. 30 tablet 0   HYDROcodone-acetaminophen (NORCO/VICODIN) 5-325 MG tablet Take 1 tablet by mouth every 6 (six) hours as needed for severe pain. 20 tablet 0   hydrocortisone (ANUSOL-HC) 2.5 % rectal cream Place rectally 3 (three) times daily. 30 g 0   metoprolol succinate (TOPROL-XL) 100 MG 24 hr tablet Take 1 tablet (100 mg total) by mouth daily. Take with or immediately following a meal. 90 tablet 1   pantoprazole (PROTONIX) 40 MG tablet Take 1 tablet (40 mg total) by mouth daily. 30 tablet 0   No facility-administered medications prior to visit.    Allergies  Allergen Reactions   Atorvastatin     Muscle cramps   Fenofibrate     "Locked her muscles up"   Hydralazine     "kidney pain"   Latex Rash    Or just redness     ROS See HPI    Objective:    Physical Exam Constitutional:      General: She is not in acute distress.    Appearance: Normal appearance. She is not ill-appearing.  HENT:     Head: Normocephalic and atraumatic.     Right Ear: External ear normal.     Left Ear: External ear normal.  Eyes:     Extraocular Movements: Extraocular movements intact.     Pupils: Pupils are equal, round, and reactive to light.  Cardiovascular:     Rate and Rhythm:  Normal rate and regular rhythm.     Heart sounds: Normal heart sounds. No murmur heard.    No gallop.  Pulmonary:     Effort: Pulmonary effort is normal. No respiratory distress.     Breath sounds: Normal breath sounds. No wheezing or rales.  Skin:    General: Skin is warm and dry.  Neurological:     Mental Status: She is alert and oriented to person, place, and time.  Psychiatric:        Mood and Affect: Mood normal.        Behavior: Behavior normal.        Judgment: Judgment normal.     BP (!) 130/90 (BP Location: Right Arm, Patient Position: Sitting, Cuff Size: Normal)   Pulse 95   Temp 98.2 F (36.8 C) (Oral)   Resp 18   Ht _0  (1.626 m)   Wt 128 lb 12.8 oz (58.4 kg)   LMP 03/04/2002   SpO2 99%   BMI 22.11 kg/m  Wt Readings from Last 3 Encounters:  12/07/21 128 lb 12.8 oz (58.4 kg)  10/02/21 137 lb 9.6 oz (62.4 kg)  09/21/21 136 lb (61.7 kg)       Assessment & Plan:   Problem List Items Addressed This Visit       Unprioritized   Moderate protein-calorie malnutrition ([place])    She is cooking and eating more protein.       RESOLVED: Hypotension - Primary   Relevant  Medications   metoprolol succinate (TOPROL-XL) 100 MG 24 hr tablet   telmisartan-hydrochlorothiazide (MICARDIS HCT) 80-25 MG tablet   GI bleed    She has not yet met with GI post hospital discharge. I advised her to schedule a follow up visit with Dr. Collene Mares and to continue pantoprazole until she sees Dr. Collene Mares for further instructions.       Essential hypertension, benign    BP looks ok today on toprol xl and micardis-hct.       Relevant Medications   metoprolol succinate (TOPROL-XL) 100 MG 24 hr tablet   telmisartan-hydrochlorothiazide (MICARDIS HCT) 80-25 MG tablet   Depression    Stable without lexapro. Monitor.       Debility    She is regaining strength.  Monitor.       Other Visit Diagnoses     B12 deficiency       Relevant Orders   B12   Folate   Hypertension, unspecified  type       Relevant Medications   metoprolol succinate (TOPROL-XL) 100 MG 24 hr tablet   telmisartan-hydrochlorothiazide (MICARDIS HCT) 80-25 MG tablet   Other Relevant Orders   Basic metabolic panel   Anemia, unspecified type       Relevant Orders   CBC with Differential/Platelet   Need for pneumococcal vaccination       Relevant Orders   Pneumococcal conjugate vaccine 13-valent (Completed)   Protein-calorie malnutrition, unspecified severity ([place])       Relevant Orders   Hepatic function panel      Prevnar 13 today.  Meds ordered this encounter  Medications   metoprolol succinate (TOPROL-XL) 100 MG 24 hr tablet    Sig: Take 1 tablet (100 mg total) by mouth daily. Take with or immediately following a meal.    Dispense:  90 tablet    Refill:  1   telmisartan-hydrochlorothiazide (MICARDIS HCT) 80-25 MG tablet    Sig: Take 1 tablet by mouth daily.    Dispense:  90 tablet    Refill:  1    Order Specific Question:   Supervising Provider    Answer:   Penni Homans A [[id number]]   pantoprazole (PROTONIX) 40 MG tablet    Sig: Take 1 tablet (40 mg total) by mouth daily.    Dispense:  90 tablet    Refill:  0    Order Specific Question:   Supervising Provider    Answer:   Penni Homans A [[id number]]    I, Karla Pear, NP, personally preformed the services described in this documentation.  All medical record entries made by the scribe were at my direction and in my presence.  I have reviewed the chart and discharge instructions (if applicable) and agree that the record reflects my personal performance and is accurate and complete. 12/07/2021   I,[name],acting as a scribe for Karla Pear, NP.,have documented all relevant documentation on the behalf of Karla Pear, NP,as directed by  Karla Pear, NP while in the presence of Karla Pear, NP.   Karla Pear, NP

## 2021-12-07 NOTE — Assessment & Plan Note (Signed)
She has not yet met with GI post hospital discharge. I advised her to schedule a follow up visit with Dr. Collene Mares and to continue pantoprazole until she sees Dr. Collene Mares for further instructions.

## 2021-12-07 NOTE — Assessment & Plan Note (Signed)
She is regaining strength.  Monitor.

## 2021-12-07 NOTE — Assessment & Plan Note (Signed)
>>  ASSESSMENT AND PLAN FOR ESSENTIAL HYPERTENSION, BENIGN WRITTEN ON 12/07/2021  3:10 PM BY O'SULLIVAN, Sinan Tuch, NP  BP looks ok today on toprol xl and micardis-hct.

## 2021-12-08 LAB — VITAMIN B12: Vitamin B-12: 392 pg/mL (ref 200–1100)

## 2021-12-08 LAB — HEPATIC FUNCTION PANEL
AG Ratio: 1.5 (calc) (ref 1.0–2.5)
ALT: 12 U/L (ref 6–29)
AST: 33 U/L (ref 10–35)
Albumin: 4.1 g/dL (ref 3.6–5.1)
Alkaline phosphatase (APISO): 39 U/L (ref 37–153)
Bilirubin, Direct: 0.1 mg/dL (ref 0.0–0.2)
Globulin: 2.7 g/dL (calc) (ref 1.9–3.7)
Indirect Bilirubin: 0.4 mg/dL (calc) (ref 0.2–1.2)
Total Bilirubin: 0.5 mg/dL (ref 0.2–1.2)
Total Protein: 6.8 g/dL (ref 6.1–8.1)

## 2021-12-08 LAB — CBC WITH DIFFERENTIAL/PLATELET
Absolute Monocytes: 874 cells/uL (ref 200–950)
Basophils Absolute: 46 cells/uL (ref 0–200)
Basophils Relative: 0.4 %
Eosinophils Absolute: 58 cells/uL (ref 15–500)
Eosinophils Relative: 0.5 %
HCT: 33.7 % — ABNORMAL LOW (ref 35.0–45.0)
Hemoglobin: 11.6 g/dL — ABNORMAL LOW (ref 11.7–15.5)
Lymphs Abs: 2243 cells/uL (ref 850–3900)
MCH: 33.2 pg — ABNORMAL HIGH (ref 27.0–33.0)
MCHC: 34.4 g/dL (ref 32.0–36.0)
MCV: 96.6 fL (ref 80.0–100.0)
MPV: 10.3 fL (ref 7.5–12.5)
Monocytes Relative: 7.6 %
Neutro Abs: 8280 cells/uL — ABNORMAL HIGH (ref 1500–7800)
Neutrophils Relative %: 72 %
Platelets: 329 10*3/uL (ref 140–400)
RBC: 3.49 10*6/uL — ABNORMAL LOW (ref 3.80–5.10)
RDW: 13.4 % (ref 11.0–15.0)
Total Lymphocyte: 19.5 %
WBC: 11.5 10*3/uL — ABNORMAL HIGH (ref 3.8–10.8)

## 2021-12-08 LAB — BASIC METABOLIC PANEL
BUN: 22 mg/dL (ref 7–25)
CO2: 28 mmol/L (ref 20–32)
Calcium: 12.2 mg/dL — ABNORMAL HIGH (ref 8.6–10.4)
Chloride: 98 mmol/L (ref 98–110)
Creat: 0.89 mg/dL (ref 0.60–1.00)
Glucose, Bld: 120 mg/dL — ABNORMAL HIGH (ref 65–99)
Potassium: 4.1 mmol/L (ref 3.5–5.3)
Sodium: 140 mmol/L (ref 135–146)

## 2021-12-08 LAB — FOLATE: Folate: >24 ng/mL

## 2021-12-10 ENCOUNTER — Telehealth: Payer: Self-pay | Admitting: Family

## 2021-12-10 MED ORDER — VITAMIN B-12 1000 MCG PO TABS: 1000.0000 ug | ORAL_TABLET | Freq: Every day | ORAL | Status: DC

## 2021-12-10 MED ORDER — VITAMIN B-12 100 MCG PO TABS: 100.0000 ug | ORAL_TABLET | Freq: Every day | ORAL | Status: DC

## 2021-12-10 NOTE — Telephone Encounter (Signed)
Called but no answer, lvm for patient to call back 

## 2021-12-10 NOTE — Telephone Encounter (Signed)
Patient called back and advised of results and to start b12 1000 mcg daily.

## 2021-12-10 NOTE — Telephone Encounter (Signed)
B12 is low normal.  Recommend that she add oral b12 1058mg once daily (OTC).  Anemia is improving.

## 2021-12-13 ENCOUNTER — Telehealth: Payer: Self-pay | Admitting: Family

## 2021-12-13 MED ORDER — ACYCLOVIR 400 MG PO TABS: 400.0000 mg | ORAL_TABLET | Freq: Three times a day (TID) | ORAL | 0 refills | Status: DC

## 2021-12-13 MED ORDER — BUPROPION HCL ER (XL) 150 MG PO TB24: 150.0000 mg | ORAL_TABLET | Freq: Every day | ORAL | 1 refills | Status: DC

## 2021-12-13 NOTE — Telephone Encounter (Signed)
Patient advised of prescriptions and she will like to call back to set up follow up

## 2021-12-13 NOTE — Telephone Encounter (Signed)
Rx sent for acyclovir.  D/c lexapro, start wellbutrin, follow up in 1 month.

## 2021-12-13 NOTE — Telephone Encounter (Signed)
Pt states the depression medication she was given by Lenna Sciara gave her headaches and she would like something else sent in. Also she has had a fever blister outbreak, that she hasn't had in over 20 years and wanted to know if acyclovir  200 mg could be sent in.     [facility], [address] [place], [place] [address] Phone: [phone]  Fax: [phone]

## 2021-12-27 ENCOUNTER — Ambulatory Visit: Payer: Medicare PPO

## 2021-12-28 ENCOUNTER — Ambulatory Visit: Payer: Medicare PPO | Admitting: Radiology

## 2021-12-28 ENCOUNTER — Telehealth: Payer: Self-pay

## 2021-12-28 VITALS — BP 116/82

## 2021-12-28 DIAGNOSIS — N6320 Unspecified lump in the left breast, unspecified quadrant: Secondary | ICD-10-CM | POA: Diagnosis not present

## 2021-12-28 NOTE — Progress Notes (Signed)
   [name] 09-Nov-1945 [id number]   History:  76 y.o. Complains of red area, itching, lump, left breast.  Noticed area October 19, 2021. Husband passed away last year--married 35 years (suffering from depression, weight loss). Daughter is moving from [place] next month to live with her. M 07/21/19 ([facility] ordered screening mammogram 11/30/21)  Gynecologic History Patient's last menstrual period was 03/04/2002.    Obstetric History OB History  Gravida Para Term Preterm AB Living  '1 1 1     1  '$ SAB IAB Ectopic Multiple Live Births               # Outcome Date GA Lbr Len/2nd Weight Sex Delivery Anes PTL Lv  1 Term              The following portions of the patient's history were reviewed and updated as appropriate: allergies, current medications, past family history, past medical history, past social history, past surgical history, and problem list.  Review of Systems Pertinent items noted in HPI and remainder of comprehensive ROS otherwise negative.   Past medical history, past surgical history, family history and social history were all reviewed and documented in the EPIC chart.   Exam:  Vitals:   12/28/21 1046  BP: 116/82   There is no height or weight on file to calculate BMI.  General appearance:  Normal Thyroid:  Symmetrical, normal in size, without palpable masses or nodularity. Respiratory  Auscultation:  Clear without wheezing or rhonchi Cardiovascular  Auscultation:  Regular rate, without rubs, murmurs or gallops  Edema/varicosities:  Not grossly evident Breasts: right breast normal without mass, skin or nipple changes or axillary nodes, abnormal mass palpable left mid breast 3cm, non tender, nonmobile.   Patient informed chaperone available to be present for breast exam. Patient has requested no chaperone to be present.   Assessment/Plan:   1. Mass of left breast, unspecified quadrant Diagnostic mammogram left, screening right   Aidden Markovic B WHNP-BC 10:59  AM 12/28/2021

## 2021-12-28 NOTE — Telephone Encounter (Signed)
Pt scheduled for 01/10/2022 for 8:50am. Pt notified and voiced understanding.

## 2021-12-28 NOTE — Telephone Encounter (Signed)
-----   Message from Kerry Dory, NP sent at 12/28/2021 10:58 AM EDT ----- Regarding: Dx mammo Please schedule diagnostic mammo left breast 3cm mass mid breast

## 2022-01-10 ENCOUNTER — Ambulatory Visit
Admission: RE | Admit: 2022-01-10 | Discharge: 2022-01-10 | Disposition: A | Discharge status: Home / Self Care | Payer: Medicare PPO | Source: Ambulatory Visit | Attending: Radiology | Admitting: Radiology

## 2022-01-10 DIAGNOSIS — N6320 Unspecified lump in the left breast, unspecified quadrant: Secondary | ICD-10-CM

## 2022-01-10 DIAGNOSIS — N6489 Other specified disorders of breast: Secondary | ICD-10-CM | POA: Diagnosis not present

## 2022-01-10 DIAGNOSIS — R92333 Mammographic heterogeneous density, bilateral breasts: Secondary | ICD-10-CM | POA: Diagnosis not present

## 2022-01-15 DIAGNOSIS — Z86010 Personal history of colonic polyps: Secondary | ICD-10-CM | POA: Diagnosis not present

## 2022-01-15 DIAGNOSIS — K5730 Diverticulosis of large intestine without perforation or abscess without bleeding: Secondary | ICD-10-CM | POA: Diagnosis not present

## 2022-01-15 DIAGNOSIS — R634 Abnormal weight loss: Secondary | ICD-10-CM | POA: Diagnosis not present

## 2022-01-15 DIAGNOSIS — K449 Diaphragmatic hernia without obstruction or gangrene: Secondary | ICD-10-CM | POA: Diagnosis not present

## 2022-02-13 ENCOUNTER — Ambulatory Visit (INDEPENDENT_AMBULATORY_CARE_PROVIDER_SITE_OTHER): Payer: Medicare PPO

## 2022-02-13 VITALS — Wt 128.0 lb

## 2022-02-13 DIAGNOSIS — Z0000 Encounter for general adult medical examination without abnormal findings: Secondary | ICD-10-CM | POA: Diagnosis not present

## 2022-02-13 NOTE — Progress Notes (Signed)
I connected with  Carlyon Shadow on 02/13/22 by a audio enabled telemedicine application and verified that I am speaking with the correct person using two identifiers.  Patient Location: Home  Provider Location: Home Office  I discussed the limitations of evaluation and management by telemedicine. The patient expressed understanding and agreed to proceed.   Subjective:   [name] is a 76 y.o. female who presents for Medicare Annual (Subsequent) preventive examination.  Review of Systems     Cardiac Risk Factors include: advanced age (>52mn, >>28women)     Objective:    Today's Vitals   02/13/22 0928  Weight: 128 lb (58.1 kg)   Body mass index is 21.97 kg/m.     02/13/2022    9:35 AM 09/21/2021   12:16 PM 09/20/2021    5:15 PM 09/20/2021    3:26 PM 09/19/2021   12:24 PM 02/09/2021    1:07 PM 08/24/2019   10:11 AM  Advanced Directives  Does Patient Have a Medical Advance Directive? Yes Yes  No Yes Yes Yes  Type of AParamedicof AWilmington IslandLiving will Living will  HFairview[place]FerndaleLiving will HRainbow CityLiving will HSt. James CityLiving will HLoma GrandeLiving will  Does patient want to make changes to medical advance directive?    Yes (Inpatient - patient requests chaplain consult to change a medical advance directive)   No - Patient declined  Copy of [place] Chart? No - copy requested     No - copy requested No - copy requested  Would patient like information on creating a medical advance directive?   Yes (Inpatient - patient requests chaplain consult to create a medical advance directive)        Current Medications (verified) Outpatient Encounter Medications as of 02/13/2022  Medication Sig   Calcium Carbonate-Vitamin D (CALCIUM + D PO) Take 2 tablets by mouth daily.    Collagen-Boron-Hyaluronic Acid (MOVE FREE ULTRA JOINT HEALTH PO) Take by mouth.    cyanocobalamin (VITAMIN B12) 1000 MCG tablet Take 1 tablet (1,000 mcg total) by mouth daily.   estradiol (ESTRACE) 0.5 MG tablet Take 1/2 tab po daily. (Patient taking differently: Take 0.25 mg by mouth daily. Take 1/2 tab po daily.)   feeding supplement (ENSURE ENLIVE / ENSURE PLUS) LIQD Take 237 mLs by mouth 3 (three) times daily between meals.   folic acid (FOLVITE) 1 MG tablet Take 1 tablet (1 mg total) by mouth daily.   KRILL OIL PO Take 1 tablet by mouth daily.   metoprolol succinate (TOPROL-XL) 100 MG 24 hr tablet Take 1 tablet (100 mg total) by mouth daily. Take with or immediately following a meal.   Multiple Vitamin (MULTIVITAMIN) tablet Take 1 tablet by mouth daily.   pantoprazole (PROTONIX) 40 MG tablet Take 1 tablet (40 mg total) by mouth daily.   telmisartan-hydrochlorothiazide (MICARDIS HCT) 80-25 MG tablet Take 1 tablet by mouth daily.   vitamin C (ASCORBIC ACID) 500 MG tablet Take 500 mg by mouth daily.   No facility-administered encounter medications on file as of 02/13/2022.    Allergies (verified) Atorvastatin, Fenofibrate, Hydralazine, Trazodone hcl, and Latex   History: Past Medical History:  Diagnosis Date   Cervical dysplasia    Endometrial hyperplasia    Fibroid    Atypical leiomyoma   History of colon polyps    Hyperglycemia 05/23/2014   Hyperglycemia    Hypertension    Past Surgical History:  Procedure  Laterality Date   ABDOMINAL HYSTERECTOMY  2004   TAH_BSO   BIOPSY  09/21/2021   Procedure: BIOPSY;  Surgeon: Carol Ada, MD;  Location: [facility];  Service: Gastroenterology;;   BREAST BIOPSY Left 2000   BREAST BIOPSY Bilateral 1999   BREAST EXCISIONAL BIOPSY Left    benign   BREAST SURGERY  1998   Benign left breast lump   COLPOSCOPY     ESOPHAGOGASTRODUODENOSCOPY (EGD) WITH PROPOFOL N/A 09/21/2021   Procedure: ESOPHAGOGASTRODUODENOSCOPY (EGD) WITH PROPOFOL;  Surgeon: Carol Ada, MD;  Location: [facility];  Service: Gastroenterology;   Laterality: N/A;   EXCISION MASS LOWER EXTREMETIES Right 05/30/2016   Procedure: EXCISION RIGHT THIGH MASS;  Surgeon: Erroll Luna, MD;  Location: [facility];  Service: General;  Laterality: Right;  EXCISION RIGHT THIGH MASS   HEMORRHOID SURGERY  1980's   LYMPH NODE BIOPSY Left 1975   ? "removed lymph nodes in left leg"--benign per pt   OOPHORECTOMY     BSO   TONSILLECTOMY  1990   Family History  Problem Relation Age of Onset   Hypertension Mother    Heart disease Mother    Diabetes Mother    Breast cancer Mother 17   Heart disease Father    Cancer Father        prostate   Cancer Sister 19       Leukemia, deceased   Breast cancer Maternal Aunt        over 79   Breast cancer Maternal Grandmother        over 18   Hypertension Brother    Diabetes Brother    Stroke Brother    Social History   Socioeconomic History   Marital status: Widowed    Spouse name: Not on file   Number of children: Not on file   Years of education: Not on file   Highest education level: Not on file  Occupational History   Not on file  Tobacco Use   Smoking status: Former    Packs/day: 0.25    Years: 30.00    Total pack years: 7.50    Types: Cigarettes    Quit date: 2013    Years since quitting: 10.9   Smokeless tobacco: Never  Vaping Use   Vaping Use: Never used  Substance and Sexual Activity   Alcohol use: Yes    Alcohol/week: 0.0 standard drinks of alcohol    Comment: 2-4 glasses of wine on weekends   Drug use: No   Sexual activity: Yes    Birth control/protection: Surgical    Comment: 1st intercourse 76 yo-fewer than 5 partners  Other Topics Concern   Not on file  Social History Narrative   2 yrs of college (business Photographer)   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   Enjoys reading, yard work, cares for her mother during the day, her brother helps during then night, enjoys poker   1 daughter (in [place])  and 2 grand-daughters (In [place]- age 74 and 29)   Social Determinants of Health   Financial Resource Strain: [place]  (02/13/2022)   Overall Financial Resource Strain (CARDIA)    Difficulty of Paying Living Expenses: Not hard at all  Food Insecurity: No Food Insecurity (02/13/2022)   Hunger Vital Sign    Worried About Running Out of Food in the Last Year: Never true    Ran Out of Food  in the Last Year: Never true  Transportation Needs: No Transportation Needs (02/13/2022)   PRAPARE - Hydrologist (Medical): No    Lack of Transportation (Non-Medical): No  Physical Activity: Insufficiently Active (02/13/2022)   Exercise Vital Sign    Days of Exercise per Week: 1 day    Minutes of Exercise per Session: 30 min  Stress: No Stress Concern Present (02/13/2022)   [place]    Feeling of Stress : Not at all  Social Connections: Socially Isolated (02/13/2022)   Social Connection and Isolation Panel [NHANES]    Frequency of Communication with Friends and Family: More than three times a week    Frequency of Social Gatherings with Friends and Family: More than three times a week    Attends Religious Services: Never    Marine scientist or Organizations: No    Attends Archivist Meetings: Never    Marital Status: Widowed    Tobacco Counseling Counseling given: Not Answered   Clinical Intake:  Pre-visit preparation completed: Yes  Pain : No/denies pain     BMI - recorded: 21.97 Nutritional Status: BMI of 19-24  Normal Nutritional Risks: None Diabetes: No  How often do you need to have someone help you when you read instructions, pamphlets, or other written materials from your doctor or pharmacy?: 1 - Never  Diabetic?no  Interpreter Needed?: No  Information entered by :: Charlott Rakes, LPN   Activities of Daily Living    02/13/2022    9:36 AM 09/20/2021    3:30 PM   In your present state of health, do you have any difficulty performing the following activities:  Hearing? 0 0  Vision? 0 0  Difficulty concentrating or making decisions? 0 0  Walking or climbing stairs? 0 1  Dressing or bathing? 0 1  Doing errands, shopping? 0 0  Preparing Food and eating ? N   Using the Toilet? N   In the past six months, have you accidently leaked urine? Y   Comment at times wearsa pad   Do you have problems with loss of bowel control? N   Managing your Medications? N   Managing your Finances? N   Housekeeping or managing your Housekeeping? N     Patient Care Team: Debbrah Alar, NP as PCP - General (Internal Medicine) Juanita Craver, MD as Consulting Physician (Gastroenterology) Charolotte Capuchin, MD as Consulting Physician (Dentistry) Tamela Gammon, NP as Nurse Practitioner (Gynecology)  Indicate any recent Medical Services you may have received from other than [facility] providers in the past year (date may be approximate).     Assessment:   This is a routine wellness examination for Emalyn.  Hearing/Vision screen Hearing Screening - Comments:: Pt denies any hearing issues  Vision Screening - Comments:: Pt follows up with Dr Mendel Ryder for annul eye exams   Dietary issues and exercise activities discussed: Current Exercise Habits: Home exercise routine, Type of exercise: walking, Time (Minutes): 30, Frequency (Times/Week): 1, Weekly Exercise (Minutes/Week): 30   Goals Addressed             This Visit's Progress    Patient Stated       Gain a little more weight        Depression Screen    02/13/2022    9:32 AM 02/09/2021    1:10 PM 12/22/2020   10:29 AM 08/24/2019   10:15 AM 03/23/2019   10:24 AM  10/16/2017    3:19 PM 09/23/2016    8:19 AM  PHQ 2/9 Scores  PHQ - 2 Score 1 1 0 0 2 0 0  PHQ- 9 Score     2      Fall Risk    02/13/2022    9:35 AM 02/09/2021    1:09 PM 12/22/2020   10:28 AM 08/24/2019   10:15 AM 10/16/2017    3:19 PM   Fall Risk   Falls in the past year? 1 0 0 0 No  Number falls in past yr: 1 0 0 0   Injury with Fall? 0 0 0 0   Risk for fall due to : Impaired vision      Follow up Falls prevention discussed Falls prevention discussed  Education provided;Falls prevention discussed     FALL RISK PREVENTION PERTAINING TO THE HOME:  Any stairs in or around the home? Yes  If so, are there any without handrails? No  Home free of loose throw rugs in walkways, pet beds, electrical cords, etc? Yes  Adequate lighting in your home to reduce risk of falls? Yes   ASSISTIVE DEVICES UTILIZED TO PREVENT FALLS:  Life alert? No  Use of a cane, walker or w/c? Yes  Grab bars in the bathroom? Yes  Shower chair or bench in shower? Yes  Elevated toilet seat or a handicapped toilet? Yes   TIMED UP AND GO:  Was the test performed? No .  Cognitive Function:    09/22/2015    8:56 AM  MMSE - Mini Mental State Exam  Orientation to time 5  Orientation to Place 5  Registration 3  Attention/ Calculation 5  Recall 3  Language- name 2 objects 2  Language- repeat 1  Language- follow 3 step command 3  Language- read & follow direction 1  Write a sentence 1  Copy design 1  Total score 30        02/13/2022    9:36 AM  6CIT Screen  What Year? 0 points  What month? 0 points  What time? 0 points  Count back from 20 2 points  Months in reverse 4 points  Repeat phrase 2 points  Total Score 8 points    Immunizations Immunization History  Administered Date(s) Administered   PFIZER(Purple Top)SARS-COV-2 Vaccination 05/06/2019, 06/02/2019   Pneumococcal Conjugate-13 12/07/2021   Pneumococcal Polysaccharide-23 11/20/2018   Td 09/19/2014   Zoster, Live 03/04/2012    TDAP status: Up to date  Flu Vaccine status: Declined, Education has been provided regarding the importance of this vaccine but patient still declined. Advised may receive this vaccine at local pharmacy or Health Dept. Aware to provide a copy of  the vaccination record if obtained from local pharmacy or Health Dept. Verbalized acceptance and understanding.  Pneumococcal vaccine status: Up to date  Covid-19 vaccine status: Completed vaccines  Qualifies for Shingles Vaccine? Yes   Zostavax completed No   Shingrix Completed?: No.    Education has been provided regarding the importance of this vaccine. Patient has been advised to call insurance company to determine out of pocket expense if they have not yet received this vaccine. Advised may also receive vaccine at local pharmacy or Health Dept. Verbalized acceptance and understanding.  Screening Tests Health Maintenance  Topic Date Due   Zoster Vaccines- Shingrix (1 of 2) Never done   COVID-19 Vaccine (3 - 2023-24 season) 11/02/2021   INFLUENZA VACCINE  06/02/2022 (Originally 10/02/2021)   Medicare Annual Wellness (AWV)  02/14/2023   MAMMOGRAM  01/11/2024   COLONOSCOPY (Pts 45-40yr Insurance coverage will need to be confirmed)  06/15/2024   DTaP/Tdap/Td (2 - Tdap) 09/18/2024   Pneumonia Vaccine 76 Years old  Completed   DEXA SCAN  Completed   Hepatitis C Screening  Completed   HPV VACCINES  Aged Out    Health Maintenance  Health Maintenance Due  Topic Date Due   Zoster Vaccines- Shingrix (1 of 2) Never done   COVID-19 Vaccine (3 - 2023-24 season) 11/02/2021    Colorectal cancer screening: Type of screening: Colonoscopy. Completed 06/16/19. Repeat every 5 years  Mammogram status: Completed 01/10/22. Repeat every year  Bone Density status: Completed 10/16/16. Results reflect: Bone density results: NORMAL. Repeat every 2 years.  Additional Screening:  Hepatitis C Screening: Completed 07/04/20  Vision Screening: Recommended annual ophthalmology exams for early detection of glaucoma and other disorders of the eye. Is the patient up to date with their annual eye exam?  Yes  Who is the provider or what is the name of the office in which the patient attends annual eye exams? Dr  LMendel Ryder If pt is not established with a provider, would they like to be referred to a provider to establish care? No .   Dental Screening: Recommended annual dental exams for proper oral hygiene  Community Resource Referral / Chronic Care Management: CRR required this visit?  No   CCM required this visit?  No      Plan:     I have personally reviewed and noted the following in the patient's chart:   Medical and social history Use of alcohol, tobacco or illicit drugs  Current medications and supplements including opioid prescriptions. Patient is not currently taking opioid prescriptions. Functional ability and status Nutritional status Physical activity Advanced directives List of other physicians Hospitalizations, surgeries, and ER visits in previous 12 months Vitals Screenings to include cognitive, depression, and falls Referrals and appointments  In addition, I have reviewed and discussed with patient certain preventive protocols, quality metrics, and best practice recommendations. A written personalized care plan for preventive services as well as general preventive health recommendations were provided to patient.     TWillette Brace LPN   [id number]  Nurse Notes: none

## 2022-02-13 NOTE — Patient Instructions (Addendum)
[name] , Thank you for taking time to come for your Medicare Wellness Visit. I appreciate your ongoing commitment to your health goals. Please review the following plan we discussed and let me know if I can assist you in the future.   These are the goals we discussed:  Goals      Patient Stated     Maintain current health     Patient Stated     Gain a little more weight         This is a list of the screening recommended for you and due dates:  Health Maintenance  Topic Date Due   Zoster (Shingles) Vaccine (1 of 2) Never done   COVID-19 Vaccine (3 - 2023-24 season) 11/02/2021   Flu Shot  06/02/2022*   Medicare Annual Wellness Visit  02/14/2023   Mammogram  01/11/2024   Colon Cancer Screening  06/15/2024   DTaP/Tdap/Td vaccine (2 - Tdap) 09/18/2024   Pneumonia Vaccine  Completed   DEXA scan (bone density measurement)  Completed   Hepatitis C Screening: USPSTF Recommendation to screen - Ages 23-79 yo.  Completed   HPV Vaccine  Aged Out  *Topic was postponed. The date shown is not the original due date.    Advanced directives: Please bring a copy of your health care power of attorney and living will to the office at your convenience.  Conditions/risks identified: gain a little more weight   Next appointment: Follow up in one year for your annual wellness visit    Preventive Care 65 Years and Older, Female Preventive care refers to lifestyle choices and visits with your health care provider that can promote health and wellness. What does preventive care include? A yearly physical exam. This is also called an annual well check. Dental exams once or twice a year. Routine eye exams. Ask your health care provider how often you should have your eyes checked. Personal lifestyle choices, including: Daily care of your teeth and gums. Regular physical activity. Eating a healthy diet. Avoiding tobacco and drug use. Limiting alcohol use. Practicing safe sex. Taking low-dose  aspirin every day. Taking vitamin and mineral supplements as recommended by your health care provider. What happens during an annual well check? The services and screenings done by your health care provider during your annual well check will depend on your age, overall health, lifestyle risk factors, and family history of disease. Counseling  Your health care provider may ask you questions about your: Alcohol use. Tobacco use. Drug use. Emotional well-being. Home and relationship well-being. Sexual activity. Eating habits. History of falls. Memory and ability to understand (cognition). Work and work Statistician. Reproductive health. Screening  You may have the following tests or measurements: Height, weight, and BMI. Blood pressure. Lipid and cholesterol levels. These may be checked every 5 years, or more frequently if you are over 19 years old. Skin check. Lung cancer screening. You may have this screening every year starting at age 67 if you have a 30-pack-year history of smoking and currently smoke or have quit within the past 15 years. Fecal occult blood test (FOBT) of the stool. You may have this test every year starting at age 55. Flexible sigmoidoscopy or colonoscopy. You may have a sigmoidoscopy every 5 years or a colonoscopy every 10 years starting at age 28. Hepatitis C blood test. Hepatitis B blood test. Sexually transmitted disease (STD) testing. Diabetes screening. This is done by checking your blood sugar (glucose) after you have not eaten for a  while (fasting). You may have this done every 1-3 years. Bone density scan. This is done to screen for osteoporosis. You may have this done starting at age 30. Mammogram. This may be done every 1-2 years. Talk to your health care provider about how often you should have regular mammograms. Talk with your health care provider about your test results, treatment options, and if necessary, the need for more tests. Vaccines  Your  health care provider may recommend certain vaccines, such as: Influenza vaccine. This is recommended every year. Tetanus, diphtheria, and acellular pertussis (Tdap, Td) vaccine. You may need a Td booster every 10 years. Zoster vaccine. You may need this after age 40. Pneumococcal 13-valent conjugate (PCV13) vaccine. One dose is recommended after age 63. Pneumococcal polysaccharide (PPSV23) vaccine. One dose is recommended after age 28. Talk to your health care provider about which screenings and vaccines you need and how often you need them. This information is not intended to replace advice given to you by your health care provider. Make sure you discuss any questions you have with your health care provider. Document Released: 03/17/2015 Document Revised: 11/08/2015 Document Reviewed: 12/20/2014 Elsevier Interactive Patient Education  2017 [place] Prevention in the Home Falls can cause injuries. They can happen to people of all ages. There are many things you can do to make your home safe and to help prevent falls. What can I do on the outside of my home? Regularly fix the edges of walkways and driveways and fix any cracks. Remove anything that might make you trip as you walk through a door, such as a raised step or threshold. Trim any bushes or trees on the path to your home. Use bright outdoor lighting. Clear any walking paths of anything that might make someone trip, such as rocks or tools. Regularly check to see if handrails are loose or broken. Make sure that both sides of any steps have handrails. Any raised decks and porches should have guardrails on the edges. Have any leaves, snow, or ice cleared regularly. Use sand or salt on walking paths during winter. Clean up any spills in your garage right away. This includes oil or grease spills. What can I do in the bathroom? Use night lights. Install grab bars by the toilet and in the tub and shower. Do not use towel bars as  grab bars. Use non-skid mats or decals in the tub or shower. If you need to sit down in the shower, use a plastic, non-slip stool. Keep the floor dry. Clean up any water that spills on the floor as soon as it happens. Remove soap buildup in the tub or shower regularly. Attach bath mats securely with double-sided non-slip rug tape. Do not have throw rugs and other things on the floor that can make you trip. What can I do in the bedroom? Use night lights. Make sure that you have a light by your bed that is easy to reach. Do not use any sheets or blankets that are too big for your bed. They should not hang down onto the floor. Have a firm chair that has side arms. You can use this for support while you get dressed. Do not have throw rugs and other things on the floor that can make you trip. What can I do in the kitchen? Clean up any spills right away. Avoid walking on wet floors. Keep items that you use a lot in easy-to-reach places. If you need to reach something above you,  use a strong step stool that has a grab bar. Keep electrical cords out of the way. Do not use floor polish or wax that makes floors slippery. If you must use wax, use non-skid floor wax. Do not have throw rugs and other things on the floor that can make you trip. What can I do with my stairs? Do not leave any items on the stairs. Make sure that there are handrails on both sides of the stairs and use them. Fix handrails that are broken or loose. Make sure that handrails are as long as the stairways. Check any carpeting to make sure that it is firmly attached to the stairs. Fix any carpet that is loose or worn. Avoid having throw rugs at the top or bottom of the stairs. If you do have throw rugs, attach them to the floor with carpet tape. Make sure that you have a light switch at the top of the stairs and the bottom of the stairs. If you do not have them, ask someone to add them for you. What else can I do to help prevent  falls? Wear shoes that: Do not have high heels. Have rubber bottoms. Are comfortable and fit you well. Are closed at the toe. Do not wear sandals. If you use a stepladder: Make sure that it is fully opened. Do not climb a closed stepladder. Make sure that both sides of the stepladder are locked into place. Ask someone to hold it for you, if possible. Clearly mark and make sure that you can see: Any grab bars or handrails. First and last steps. Where the edge of each step is. Use tools that help you move around (mobility aids) if they are needed. These include: Canes. Walkers. Scooters. Crutches. Turn on the lights when you go into a dark area. Replace any light bulbs as soon as they burn out. Set up your furniture so you have a clear path. Avoid moving your furniture around. If any of your floors are uneven, fix them. If there are any pets around you, be aware of where they are. Review your medicines with your doctor. Some medicines can make you feel dizzy. This can increase your chance of falling. Ask your doctor what other things that you can do to help prevent falls. This information is not intended to replace advice given to you by your health care provider. Make sure you discuss any questions you have with your health care provider. Document Released: 12/15/2008 Document Revised: 07/27/2015 Document Reviewed: 03/25/2014 Elsevier Interactive Patient Education  2017 [employer].

## 2022-02-14 NOTE — Telephone Encounter (Signed)
Results received, results/recommendations relayed to pt by radiologist @ [facility]. Will close encounter.

## 2022-02-27 ENCOUNTER — Other Ambulatory Visit: Payer: Self-pay | Admitting: Family

## 2022-03-12 ENCOUNTER — Ambulatory Visit: Payer: Medicare PPO | Admitting: Family

## 2022-03-13 ENCOUNTER — Ambulatory Visit: Payer: Medicare PPO | Admitting: Family

## 2022-03-20 ENCOUNTER — Encounter: Payer: Self-pay | Admitting: Family

## 2022-03-20 ENCOUNTER — Ambulatory Visit: Payer: Medicare PPO | Admitting: Family

## 2022-03-20 VITALS — BP 130/75 | HR 62 | Temp 98.2°F | Resp 16 | Wt 137.0 lb

## 2022-03-20 DIAGNOSIS — E781 Pure hyperglyceridemia: Secondary | ICD-10-CM | POA: Diagnosis not present

## 2022-03-20 DIAGNOSIS — Z8679 Personal history of other diseases of the circulatory system: Secondary | ICD-10-CM

## 2022-03-20 DIAGNOSIS — I10 Essential (primary) hypertension: Secondary | ICD-10-CM

## 2022-03-20 DIAGNOSIS — K581 Irritable bowel syndrome with constipation: Secondary | ICD-10-CM | POA: Diagnosis not present

## 2022-03-20 LAB — LIPID PANEL
Cholesterol: 210 mg/dL — ABNORMAL HIGH (ref 0–200)
HDL: 58.9 mg/dL (ref >39.00)
NonHDL: 151.29
Total CHOL/HDL Ratio: 4
Triglycerides: 275 mg/dL — ABNORMAL HIGH (ref 0.0–149.0)
VLDL: 55 mg/dL — ABNORMAL HIGH (ref 0.0–40.0)

## 2022-03-20 LAB — COMPREHENSIVE METABOLIC PANEL
ALT: 8 U/L (ref 0–35)
AST: 17 U/L (ref 0–37)
Albumin: 4.1 g/dL (ref 3.5–5.2)
Alkaline Phosphatase: 26 U/L — ABNORMAL LOW (ref 39–117)
BUN: 22 mg/dL (ref 6–23)
CO2: 31 mEq/L (ref 19–32)
Calcium: 9.8 mg/dL (ref 8.4–10.5)
Chloride: 99 mEq/L (ref 96–112)
Creatinine, Ser: 0.99 mg/dL (ref 0.40–1.20)
GFR: 56.31 mL/min — ABNORMAL LOW (ref >60.00)
Glucose, Bld: 116 mg/dL — ABNORMAL HIGH (ref 70–99)
Potassium: 4 mEq/L (ref 3.5–5.1)
Sodium: 139 mEq/L (ref 135–145)
Total Bilirubin: 0.5 mg/dL (ref 0.2–1.2)
Total Protein: 6.8 g/dL (ref 6.0–8.3)

## 2022-03-20 LAB — LDL CHOLESTEROL, DIRECT: Direct LDL: 107 mg/dL

## 2022-03-20 MED ORDER — PANTOPRAZOLE SODIUM 40 MG PO TBEC: 40.0000 mg | DELAYED_RELEASE_TABLET | Freq: Every day | ORAL | 1 refills | Status: DC

## 2022-03-20 MED ORDER — METOPROLOL SUCCINATE ER 100 MG PO TB24: 100.0000 mg | ORAL_TABLET | Freq: Every day | ORAL | 1 refills | Status: DC

## 2022-03-20 MED ORDER — TELMISARTAN-HCTZ 80-25 MG PO TABS: 1.0000 | ORAL_TABLET | Freq: Every day | ORAL | 1 refills | Status: DC

## 2022-03-20 MED ORDER — LINACLOTIDE 145 MCG PO CAPS: 145.0000 ug | ORAL_CAPSULE | Freq: Every day | ORAL | 3 refills | Status: DC

## 2022-03-20 NOTE — Progress Notes (Signed)
Subjective:   By signing my name below, I, Shehryar Baig, attest that this documentation has been prepared under the direction and in the presence of Debbrah Alar, NP. 03/20/2022   Patient ID: [name], female    DOB: [birth year], 77 y.o.   MRN: [id number]  Chief Complaint  Patient presents with   Hypertension    Here for follow upd   Grief reaction    Patient is in today for a follow up visit.   Blood pressure: Her blood pressure is elevated during this visit. She continues taking 100 mg metoprolol succinate daily PO, 80-25 mg Micardis HCT and reports no new issues while taking them. She is requesting a refill on them as well.  BP Readings from Last 3 Encounters:  03/21/22 130/75  12/28/21 116/82  12/07/21 (!) 130/90   Pulse Readings from Last 3 Encounters:  03/20/22 62  12/07/21 95  10/02/21 66   Constipation: She reports having constipation and is requesting linzess to manage her symptoms.  Depression: Her mood has improved since her last visit. She has not taken any of her prescribed depression medication. She notes that since her daughter moved back she feels better.   Immunizations: She does not have the flu vaccine this year and is not interested in receiving it.    Past Medical History:  Diagnosis Date   Cervical dysplasia    Endometrial hyperplasia    Fibroid    Atypical leiomyoma   History of colon polyps    Hyperglycemia 05/23/2014   Hyperglycemia    Hypertension     Past Surgical History:  Procedure Laterality Date   ABDOMINAL HYSTERECTOMY  2004   TAH_BSO   BIOPSY  09/21/2021   Procedure: BIOPSY;  Surgeon: Carol Ada, MD;  Location: [facility];  Service: Gastroenterology;;   BREAST BIOPSY Left 2000   BREAST BIOPSY Bilateral 1999   BREAST EXCISIONAL BIOPSY Left    benign   BREAST SURGERY  1998   Benign left breast lump   COLPOSCOPY     ESOPHAGOGASTRODUODENOSCOPY (EGD) WITH PROPOFOL N/A 09/21/2021   Procedure:  ESOPHAGOGASTRODUODENOSCOPY (EGD) WITH PROPOFOL;  Surgeon: Carol Ada, MD;  Location: [facility];  Service: Gastroenterology;  Laterality: N/A;   EXCISION MASS LOWER EXTREMETIES Right 05/30/2016   Procedure: EXCISION RIGHT THIGH MASS;  Surgeon: Erroll Luna, MD;  Location: [place];  Service: General;  Laterality: Right;  EXCISION RIGHT THIGH MASS   HEMORRHOID SURGERY  1980's   LYMPH NODE BIOPSY Left 1975   ? "removed lymph nodes in left leg"--benign per pt   OOPHORECTOMY     BSO   TONSILLECTOMY  1990    Family History  Problem Relation Age of Onset   Hypertension Mother    Heart disease Mother    Diabetes Mother    Breast cancer Mother 58   Heart disease Father    Cancer Father        prostate   Cancer Sister 28       Leukemia, deceased   Breast cancer Maternal Aunt        over 9   Breast cancer Maternal Grandmother        over 80   Hypertension Brother    Diabetes Brother    Stroke Brother     Social History   Socioeconomic History   Marital status: Widowed    Spouse name: Not on file   Number of children: Not on file   Years of education: Not on file  Highest education level: Not on file  Occupational History   Not on file  Tobacco Use   Smoking status: Former    Packs/day: 0.25    Years: 30.00    Total pack years: 7.50    Types: Cigarettes    Quit date: 2013    Years since quitting: 11.0   Smokeless tobacco: Never  Vaping Use   Vaping Use: Never used  Substance and Sexual Activity   Alcohol use: Yes    Alcohol/week: 0.0 standard drinks of alcohol    Comment: 2-4 glasses of wine on weekends   Drug use: No   Sexual activity: Yes    Birth control/protection: Surgical    Comment: 1st intercourse 77 yo-fewer than 5 partners  Other Topics Concern   Not on file  Social History Narrative   2 yrs of college (business Photographer)   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    Enjoys reading, yard work, cares for her mother during the day, her brother helps during then night, enjoys poker   1 daughter (in [place]) and 2 grand-daughters (In [place]- age 66 and 70)   Social Determinants of Health   Financial Resource Strain: [place]  (02/13/2022)   Overall Financial Resource Strain (CARDIA)    Difficulty of Paying Living Expenses: Not hard at all  Food Insecurity: No Food Insecurity (02/13/2022)   Hunger Vital Sign    Worried About Running Out of Food in the Last Year: Never true    [place] in the Last Year: Never true  Transportation Needs: No Transportation Needs (02/13/2022)   PRAPARE - Hydrologist (Medical): No    Lack of Transportation (Non-Medical): No  Physical Activity: Insufficiently Active (02/13/2022)   Exercise Vital Sign    Days of Exercise per Week: 1 day    Minutes of Exercise per Session: 30 min  Stress: No Stress Concern Present (02/13/2022)   [place]    Feeling of Stress : Not at all  Social Connections: Socially Isolated (02/13/2022)   Social Connection and Isolation Panel [NHANES]    Frequency of Communication with Friends and Family: More than three times a week    Frequency of Social Gatherings with Friends and Family: More than three times a week    Attends Religious Services: Never    Marine scientist or Organizations: No    Attends Archivist Meetings: Never    Marital Status: Widowed  Intimate Partner Violence: Not At Risk (02/13/2022)   Humiliation, Afraid, Rape, and Kick questionnaire    Fear of Current or Ex-Partner: No    Emotionally Abused: No    Physically Abused: No    Sexually Abused: No    Outpatient Medications Prior to Visit  Medication Sig Dispense Refill   acyclovir (ZOVIRAX) 400 MG tablet TAKE 1 TABLET BY MOUTH 3 TIMES DAILY FOR 5 DAYS. 15 tablet 0   Calcium Carbonate-Vitamin D (CALCIUM + D PO) Take  2 tablets by mouth daily.      Collagen-Boron-Hyaluronic Acid (MOVE FREE ULTRA JOINT HEALTH PO) Take by mouth.     cyanocobalamin (VITAMIN B12) 1000 MCG tablet Take 1 tablet (1,000 mcg total) by mouth daily.     estradiol (ESTRACE) 0.5 MG tablet Take 1/2 tab po daily. (Patient taking differently: Take 0.25 mg by  mouth daily. Take 1/2 tab po daily.) 45 tablet 0   feeding supplement (ENSURE ENLIVE / ENSURE PLUS) LIQD Take 237 mLs by mouth 3 (three) times daily between meals. 024 mL 12   folic acid (FOLVITE) 1 MG tablet Take 1 tablet (1 mg total) by mouth daily. 30 tablet 0   KRILL OIL PO Take 1 tablet by mouth daily.     Multiple Vitamin (MULTIVITAMIN) tablet Take 1 tablet by mouth daily.     vitamin C (ASCORBIC ACID) 500 MG tablet Take 500 mg by mouth daily.     metoprolol succinate (TOPROL-XL) 100 MG 24 hr tablet Take 1 tablet (100 mg total) by mouth daily. Take with or immediately following a meal. 90 tablet 1   pantoprazole (PROTONIX) 40 MG tablet Take 1 tablet (40 mg total) by mouth daily. 90 tablet 0   telmisartan-hydrochlorothiazide (MICARDIS HCT) 80-25 MG tablet Take 1 tablet by mouth daily. 90 tablet 1   No facility-administered medications prior to visit.    Allergies  Allergen Reactions   Atorvastatin     Muscle cramps   Fenofibrate     "Locked her muscles up"   Hydralazine     "kidney pain"   Trazodone Hcl Other (See Comments)    headache   Latex Rash    Or just redness     ROS See HPI    Objective:    Physical Exam Constitutional:      General: She is not in acute distress.    Appearance: Normal appearance. She is not ill-appearing.  HENT:     Head: Normocephalic and atraumatic.     Right Ear: External ear normal.     Left Ear: External ear normal.  Eyes:     Extraocular Movements: Extraocular movements intact.     Pupils: Pupils are equal, round, and reactive to light.  Cardiovascular:     Rate and Rhythm: Normal rate and regular rhythm.     Heart sounds:  Normal heart sounds. No murmur heard.    No gallop.     Comments: Blood pressure measured 130/75 during manual recheck Pulmonary:     Effort: Pulmonary effort is normal. No respiratory distress.     Breath sounds: Normal breath sounds. No wheezing or rales.  Skin:    General: Skin is warm and dry.  Neurological:     Mental Status: She is alert and oriented to person, place, and time.  Psychiatric:        Judgment: Judgment normal.     BP 130/75   Pulse 62   Temp 98.2 F (36.8 C) (Oral)   Resp 16   Wt 137 lb (62.1 kg)   LMP 03/04/2002   SpO2 100%   BMI 23.52 kg/m  Wt Readings from Last 3 Encounters:  03/20/22 137 lb (62.1 kg)  02/13/22 128 lb (58.1 kg)  12/07/21 128 lb 12.8 oz (58.4 kg)       Assessment & Plan:  Essential hypertension, benign Assessment & Plan: Initial bp was elevated but repeat bp OK. Continue current dose of micardis hct and metoprolol.    Hypercalcemia Assessment & Plan: Noted on previous labs.  Will repeat.   Orders: -     Comprehensive metabolic panel -     Parathyroid hormone, intact (no Ca)  Hypertriglyceridemia Assessment & Plan: Not on statin. Obtain follow up lipid panel.   Orders: -     Lipid panel  History of hypotension Assessment & Plan:  Orders: -  Telmisartan-HCTZ; Take 1 tablet by mouth daily.  Dispense: 90 tablet; Refill: 1 -     Metoprolol Succinate ER; Take 1 tablet (100 mg total) by mouth daily. Take with or immediately following a meal.  Dispense: 90 tablet; Refill: 1  Irritable bowel syndrome with constipation Assessment & Plan: She is requesting rx for Linzess which she has found helpful in the past.    Other orders -     linaCLOtide; Take 1 capsule (145 mcg total) by mouth daily before breakfast.  Dispense: 30 capsule; Refill: 3 -     Pantoprazole Sodium; Take 1 tablet (40 mg total) by mouth daily.  Dispense: 90 tablet; Refill: 1 -     LDL cholesterol, direct    I, Nance Pear, NP, personally  preformed the services described in this documentation.  All medical record entries made by the scribe were at my direction and in my presence.  I have reviewed the chart and discharge instructions (if applicable) and agree that the record reflects my personal performance and is accurate and complete. 03/20/2022   I,Shehryar Baig,acting as a scribe for Nance Pear, NP.,have documented all relevant documentation on the behalf of Nance Pear, NP,as directed by  Nance Pear, NP while in the presence of Nance Pear, NP.   Nance Pear, NP

## 2022-03-20 NOTE — Assessment & Plan Note (Addendum)
BP Readings from Last 3 Encounters:  03/21/22 130/75  12/28/21 116/82  12/07/21 (!) 130/90   Maintained on micardis hct. Initial bp was elevated but repeat WNL. Continue current dose of micardis hct.

## 2022-03-20 NOTE — Assessment & Plan Note (Signed)
>>  ASSESSMENT AND PLAN FOR ESSENTIAL HYPERTENSION, BENIGN WRITTEN ON 03/21/2022  9:37 AM BY O'SULLIVAN, Kyng Matlock, NP  BP Readings from Last 3 Encounters:  03/21/22 130/75  12/28/21 116/82  12/07/21 (!) 130/90   Maintained on micardis hct. Initial bp was elevated but repeat WNL. Continue current dose of micardis hct.

## 2022-03-21 DIAGNOSIS — K581 Irritable bowel syndrome with constipation: Secondary | ICD-10-CM | POA: Insufficient documentation

## 2022-03-21 DIAGNOSIS — I10 Essential (primary) hypertension: Secondary | ICD-10-CM | POA: Insufficient documentation

## 2022-03-21 LAB — PARATHYROID HORMONE, INTACT (NO CA): PTH: 37 pg/mL (ref 16–77)

## 2022-03-21 NOTE — Assessment & Plan Note (Addendum)
Initial bp was elevated but repeat bp OK. Continue current dose of micardis hct and metoprolol.

## 2022-03-21 NOTE — Assessment & Plan Note (Signed)
Not on statin. Obtain follow up lipid panel.

## 2022-03-21 NOTE — Assessment & Plan Note (Signed)
She is requesting rx for Linzess which she has found helpful in the past.

## 2022-03-21 NOTE — Assessment & Plan Note (Signed)
Noted on previous labs.  Will repeat.

## 2022-04-01 ENCOUNTER — Telehealth: Payer: Self-pay | Admitting: Family

## 2022-04-01 ENCOUNTER — Other Ambulatory Visit: Payer: Self-pay

## 2022-04-01 DIAGNOSIS — Z79890 Hormone replacement therapy (postmenopausal): Secondary | ICD-10-CM

## 2022-04-01 MED ORDER — ESTRADIOL 0.5 MG PO TABS: ORAL_TABLET | ORAL | 0 refills | Status: DC

## 2022-04-01 NOTE — Telephone Encounter (Signed)
Pt calling to request refill on estradiol. Take 1/2 tab daily. States will be out as of tomorrow.   Last AEX 10/20/2019--scheduled for 05/02/2022. Last mammo 01/10/2022-birads 2 benign.

## 2022-04-01 NOTE — Telephone Encounter (Signed)
Patient called to advise that she needs a refill on a medication but her OBGYN is on an extended vacation until 2/29. Patient would like one refill on her Estradiol 0.5 mg to get her through until her OBGYN is back in the office to continue refills. Patient uses [facility].

## 2022-04-01 NOTE — Telephone Encounter (Signed)
Patient advised rx wa setn today to [facility] per GYN

## 2022-05-02 ENCOUNTER — Encounter: Payer: Self-pay | Admitting: Radiology

## 2022-05-02 ENCOUNTER — Ambulatory Visit (INDEPENDENT_AMBULATORY_CARE_PROVIDER_SITE_OTHER): Payer: Medicare PPO | Admitting: Radiology

## 2022-05-02 VITALS — BP 136/84 | Ht 63.5 in | Wt 140.0 lb

## 2022-05-02 DIAGNOSIS — Z8619 Personal history of other infectious and parasitic diseases: Secondary | ICD-10-CM | POA: Diagnosis not present

## 2022-05-02 DIAGNOSIS — Z9189 Other specified personal risk factors, not elsewhere classified: Secondary | ICD-10-CM

## 2022-05-02 DIAGNOSIS — Z01419 Encounter for gynecological examination (general) (routine) without abnormal findings: Secondary | ICD-10-CM

## 2022-05-02 NOTE — Progress Notes (Signed)
   [name] April 06, 1945 [id number]   History:  77 y.o. G1P1 presents for annual exam. No gyn concerns. S/p hyst 2004 for atypical hyperplasia. High risk medicare, hx of STD.  Gynecologic History Hysterectomy: 2004  Sexually active: no  Health Maintenance Last Pap: 2013.  Last mammogram: 01/10/22. Results were:  Last colonoscopy: 06/16/19. Results were:  Last Dexa: 10/16/16. Results were:  HRT use:  Past medical history, past surgical history, family history and social history were all reviewed and documented in the EPIC chart.  ROS:  A ROS was performed and pertinent positives and negatives are included.  Exam:  Vitals:   05/02/22 1351  BP: 136/84  Weight: 140 lb (63.5 kg)  Height: 5' 3.5" (1.613 m)   Body mass index is 24.41 kg/m.  General appearance:  Normal Thyroid:  Symmetrical, normal in size, without palpable masses or nodularity. Respiratory  Auscultation:  Clear without wheezing or rhonchi Cardiovascular  Auscultation:  Regular rate, without rubs, murmurs or gallops  Edema/varicosities:  Not grossly evident Abdominal  Soft,nontender, without masses, guarding or rebound.  Liver/spleen:  No organomegaly noted  Hernia:  None appreciated  Skin  Inspection:  Grossly normal Breasts: Examined lying and sitting.   Right: Without masses, retractions, nipple discharge or axillary adenopathy.   Left: Without masses, retractions, nipple discharge or axillary adenopathy. Genitourinary   Inguinal/mons:  Normal without inguinal adenopathy  External genitalia:  Normal appearing vulva with no masses, tenderness, or lesions  BUS/Urethra/Skene's glands:  Normal  Vagina:  Normal appearing with normal color and discharge, no lesions. Atrophy mild  Cervix:  absent  Uterus:  absent  Adnexa/parametria:  absent  Anus and perineum: Normal    Patient informed chaperone available to be present for breast and pelvic exam. Patient has requested no chaperone to be present.  Patient has been advised what will be completed during breast and pelvic exam.   Assessment/Plan:   1. Well woman exam with routine gynecological exam Follow up 1 year HR B&P     Discussed SBE, colonoscopy and DEXA screening as appropriate. Encouraged 142mns/week of cardiovascular and weight bearing exercise minimum. Recommend the use of seatbelts and sunscreen consistently.   Return in 1 year for annual or sooner prn.  CRubbie BattiestB WHNP-BC 2:25 PM 05/02/2022

## 2022-05-07 DIAGNOSIS — K625 Hemorrhage of anus and rectum: Secondary | ICD-10-CM | POA: Diagnosis not present

## 2022-05-07 DIAGNOSIS — K5900 Constipation, unspecified: Secondary | ICD-10-CM | POA: Diagnosis not present

## 2022-05-07 DIAGNOSIS — Z1211 Encounter for screening for malignant neoplasm of colon: Secondary | ICD-10-CM | POA: Diagnosis not present

## 2022-05-07 DIAGNOSIS — K5730 Diverticulosis of large intestine without perforation or abscess without bleeding: Secondary | ICD-10-CM | POA: Diagnosis not present

## 2022-05-07 DIAGNOSIS — Z86010 Personal history of colonic polyps: Secondary | ICD-10-CM | POA: Diagnosis not present

## 2022-05-27 ENCOUNTER — Other Ambulatory Visit: Payer: Self-pay | Admitting: Radiology

## 2022-05-27 DIAGNOSIS — Z79890 Hormone replacement therapy (postmenopausal): Secondary | ICD-10-CM

## 2022-05-28 NOTE — Telephone Encounter (Signed)
Patient called inquiring about his refill.

## 2022-07-10 DIAGNOSIS — Z1211 Encounter for screening for malignant neoplasm of colon: Secondary | ICD-10-CM | POA: Diagnosis not present

## 2022-07-10 DIAGNOSIS — K5730 Diverticulosis of large intestine without perforation or abscess without bleeding: Secondary | ICD-10-CM | POA: Diagnosis not present

## 2022-07-10 DIAGNOSIS — K635 Polyp of colon: Secondary | ICD-10-CM | POA: Diagnosis not present

## 2022-07-10 DIAGNOSIS — D122 Benign neoplasm of ascending colon: Secondary | ICD-10-CM | POA: Diagnosis not present

## 2022-07-10 DIAGNOSIS — D125 Benign neoplasm of sigmoid colon: Secondary | ICD-10-CM | POA: Diagnosis not present

## 2022-07-10 DIAGNOSIS — Z86010 Personal history of colonic polyps: Secondary | ICD-10-CM | POA: Diagnosis not present

## 2022-07-10 LAB — HM COLONOSCOPY

## 2022-08-02 ENCOUNTER — Telehealth: Payer: Self-pay | Admitting: Family

## 2022-08-02 NOTE — Telephone Encounter (Signed)
Spoke to patient. She said that her GI told her that she had CKD.  I reviewed most recent lab work which revealed GFR 56.3.  We discussed that this is very mildly reduced and best treatment is good blood pressure control, good hydration and avoiding NSAIDS.  Pt verbalizes understanding.

## 2022-08-02 NOTE — Telephone Encounter (Signed)
Pt requested a callback from her provider regarding a recent colonoscopy. Pt said she does not want to talk to CMA but the provider if possible.

## 2022-08-23 DIAGNOSIS — D2239 Melanocytic nevi of other parts of face: Secondary | ICD-10-CM | POA: Diagnosis not present

## 2022-08-23 DIAGNOSIS — L820 Inflamed seborrheic keratosis: Secondary | ICD-10-CM | POA: Diagnosis not present

## 2022-08-29 ENCOUNTER — Other Ambulatory Visit: Payer: Self-pay | Admitting: Radiology

## 2022-08-29 DIAGNOSIS — Z79890 Hormone replacement therapy (postmenopausal): Secondary | ICD-10-CM

## 2022-08-29 NOTE — Telephone Encounter (Signed)
Medication refill request: estradiol tablet 0.5 mg  Last AEX:  05/02/22 Next AEX: not scheduled  Last MMG (if hormonal medication request): 01-10-22 bi-rads 2  Refill authorized: #[id number] with 2 rf pended for today

## 2022-09-18 ENCOUNTER — Ambulatory Visit: Payer: Medicare PPO | Admitting: Family

## 2022-10-02 ENCOUNTER — Encounter (INDEPENDENT_AMBULATORY_CARE_PROVIDER_SITE_OTHER): Payer: Self-pay

## 2022-10-23 ENCOUNTER — Encounter: Payer: Self-pay | Admitting: Family

## 2022-10-23 ENCOUNTER — Ambulatory Visit: Payer: Medicare PPO | Admitting: Family

## 2022-10-23 VITALS — BP 133/83 | HR 71 | Temp 97.8°F | Resp 12 | Ht 63.5 in | Wt 147.6 lb

## 2022-10-23 DIAGNOSIS — E781 Pure hyperglyceridemia: Secondary | ICD-10-CM

## 2022-10-23 DIAGNOSIS — I10 Essential (primary) hypertension: Secondary | ICD-10-CM

## 2022-10-23 DIAGNOSIS — F329 Major depressive disorder, single episode, unspecified: Secondary | ICD-10-CM | POA: Diagnosis not present

## 2022-10-23 DIAGNOSIS — K581 Irritable bowel syndrome with constipation: Secondary | ICD-10-CM

## 2022-10-23 DIAGNOSIS — K922 Gastrointestinal hemorrhage, unspecified: Secondary | ICD-10-CM

## 2022-10-23 DIAGNOSIS — N1831 Chronic kidney disease, stage 3a: Secondary | ICD-10-CM

## 2022-10-23 DIAGNOSIS — Z8719 Personal history of other diseases of the digestive system: Secondary | ICD-10-CM | POA: Diagnosis not present

## 2022-10-23 DIAGNOSIS — Z8679 Personal history of other diseases of the circulatory system: Secondary | ICD-10-CM

## 2022-10-23 LAB — HEMOGLOBIN A1C: Hgb A1c MFr Bld: 6.5 % (ref 4.6–6.5)

## 2022-10-23 LAB — BASIC METABOLIC PANEL
BUN: 17 mg/dL (ref 6–23)
CO2: 32 mEq/L (ref 19–32)
Calcium: 9.3 mg/dL (ref 8.4–10.5)
Chloride: 99 mEq/L (ref 96–112)
Creatinine, Ser: 1.13 mg/dL (ref 0.40–1.20)
GFR: 47.84 mL/min — ABNORMAL LOW (ref >60.00)
Glucose, Bld: 176 mg/dL — ABNORMAL HIGH (ref 70–99)
Potassium: 3.7 mEq/L (ref 3.5–5.1)
Sodium: 141 mEq/L (ref 135–145)

## 2022-10-23 MED ORDER — PANTOPRAZOLE SODIUM 40 MG PO TBEC: 40.0000 mg | DELAYED_RELEASE_TABLET | Freq: Every day | ORAL | 1 refills | Status: DC

## 2022-10-23 MED ORDER — TELMISARTAN-HCTZ 80-25 MG PO TABS: 1.0000 | ORAL_TABLET | Freq: Every day | ORAL | 1 refills | Status: DC

## 2022-10-23 MED ORDER — METOPROLOL SUCCINATE ER 100 MG PO TB24: 100.0000 mg | ORAL_TABLET | Freq: Every day | ORAL | 1 refills | Status: DC

## 2022-10-23 NOTE — Assessment & Plan Note (Signed)
BP stable.

## 2022-10-23 NOTE — Progress Notes (Unsigned)
Subjective:     Patient ID: [name], female    DOB: [birth year], 77 y.o.   MRN: [id number]  Chief Complaint  Patient presents with   6 month follow up    HPI  Discussed the use of AI scribe software for clinical note transcription with the patient, who gave verbal consent to proceed.  History of Present Illness   The patient, with a history of hypertension, irritable bowel syndrome, and hyperlipidemia, presents for a six month follow up. She reports good control of their blood pressure on Micardis HCT and metoprolol. Their irritable bowel syndrome has improved with the addition of Linzess, which she takes as needed when she does not have a bowel movement for two days. She reports feeling bloated and 'big' when she does not have a bowel movement. She has noticed some weight gain, which she attributes to their daughter's cooking. She has been adhering to their pantoprazole regimen, which was started during a hospital stay. She is unsure if it was started due to a stomach bleed or heartburn, but she denies any black or bloody stools. She recently had a colonoscopy, which showed only three small lesions that were reportedly benign. . She has been managing her mood well overall, although she occasionally experiences bouts of tears triggered by certain stimuli.     Lab Results  Component Value Date   HGBA1C 6.5 10/23/2022       Health Maintenance Due  Topic Date Due   Zoster Vaccines- Shingrix (1 of 2) 01/17/1998   COVID-19 Vaccine (3 - 2023-24 season) 11/02/2021    Past Medical History:  Diagnosis Date   Cervical dysplasia    Endometrial hyperplasia    Fibroid    Atypical leiomyoma   History of colon polyps    Hyperglycemia 05/23/2014   Hyperglycemia    Hypertension     Past Surgical History:  Procedure Laterality Date   ABDOMINAL HYSTERECTOMY  2004   TAH_BSO   BIOPSY  09/21/2021   Procedure: BIOPSY;  Surgeon: Jeani Hawking, MD;  Location: [facility];  Service:  Gastroenterology;;   BREAST BIOPSY Left 2000   BREAST BIOPSY Bilateral 1999   BREAST EXCISIONAL BIOPSY Left    benign   BREAST SURGERY  1998   Benign left breast lump   COLPOSCOPY     ESOPHAGOGASTRODUODENOSCOPY (EGD) WITH PROPOFOL N/A 09/21/2021   Procedure: ESOPHAGOGASTRODUODENOSCOPY (EGD) WITH PROPOFOL;  Surgeon: Jeani Hawking, MD;  Location: [facility];  Service: Gastroenterology;  Laterality: N/A;   EXCISION MASS LOWER EXTREMETIES Right 05/30/2016   Procedure: EXCISION RIGHT THIGH MASS;  Surgeon: Harriette Bouillon, MD;  Location: [facility];  Service: General;  Laterality: Right;  EXCISION RIGHT THIGH MASS   HEMORRHOID SURGERY  1980's   LYMPH NODE BIOPSY Left 1975   ? "removed lymph nodes in left leg"--benign per pt   OOPHORECTOMY     BSO   TONSILLECTOMY  1990    Family History  Problem Relation Age of Onset   Hypertension Mother    Heart disease Mother    Diabetes Mother    Breast cancer Mother 72   Heart disease Father    Cancer Father        prostate   Cancer Sister 73       Leukemia, deceased   Breast cancer Maternal Aunt        over 58   Breast cancer Maternal Grandmother        over 64   Hypertension Brother  Diabetes Brother    Stroke Brother     Social History   Socioeconomic History   Marital status: Widowed    Spouse name: Not on file   Number of children: Not on file   Years of education: Not on file   Highest education level: Not on file  Occupational History   Not on file  Tobacco Use   Smoking status: Former    Current packs/day: 0.00    Average packs/day: 0.3 packs/day for 30.0 years (7.5 ttl pk-yrs)    Types: Cigarettes    Start date: 37    Quit date: 2013    Years since quitting: 11.6   Smokeless tobacco: Never  Vaping Use   Vaping status: Never Used  Substance and Sexual Activity   Alcohol use: Yes    Alcohol/week: 0.0 standard drinks of alcohol    Comment: 2-4 glasses of wine on weekends   Drug use: No   Sexual activity: Not  Currently    Partners: Male    Birth control/protection: Surgical    Comment: 1st intercourse 77 yo-fewer than 5 partners, HX STI (HSV) G[facility] High Risk Medicare  Other Topics Concern   Not on file  Social History Narrative   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   Enjoys reading, yard work, cares for her mother during the day, her brother helps during then night, enjoys poker   1 daughter (in [place]) and 2 grand-daughters (In [place]- age 8 and 41)   Social Determinants of Health   Financial Resource Strain: Low Risk  (02/13/2022)   Overall Financial Resource Strain (CARDIA)    Difficulty of Paying Living Expenses: Not hard at all  Food Insecurity: No Food Insecurity (02/13/2022)   Hunger Vital Sign    Worried About Running Out of Food in the Last Year: Never true    Ran Out of Food in the Last Year: Never true  Transportation Needs: No Transportation Needs (02/13/2022)   PRAPARE - Administrator, Civil Service (Medical): No    Lack of Transportation (Non-Medical): No  Physical Activity: Insufficiently Active (02/13/2022)   Exercise Vital Sign    Days of Exercise per Week: 1 day    Minutes of Exercise per Session: 30 min  Stress: No Stress Concern Present (02/13/2022)   [employer] of Occupational Health - Occupational Stress Questionnaire    Feeling of Stress : Not at all  Social Connections: Socially Isolated (02/13/2022)   Social Connection and Isolation Panel [NHANES]    Frequency of Communication with Friends and Family: More than three times a week    Frequency of Social Gatherings with Friends and Family: More than three times a week    Attends Religious Services: Never    Database administrator or Organizations: No    Attends Banker Meetings: Never    Marital Status: Widowed  Intimate Partner Violence: Not At Risk (02/13/2022)    Humiliation, Afraid, Rape, and Kick questionnaire    Fear of Current or Ex-Partner: No    Emotionally Abused: No    Physically Abused: No    Sexually Abused: No    Outpatient Medications Prior to Visit  Medication Sig Dispense Refill   Calcium Carbonate-Vitamin D (CALCIUM + D PO) Take 2 tablets by mouth daily.      Collagen-Boron-Hyaluronic Acid (MOVE  FREE ULTRA JOINT HEALTH PO) Take by mouth.     cyanocobalamin (VITAMIN B12) 1000 MCG tablet Take 1 tablet (1,000 mcg total) by mouth daily.     estradiol (ESTRACE) 0.5 MG tablet TAKE 1/2 TABLET BY MOUTH DAILY 45 tablet 0   feeding supplement (ENSURE ENLIVE / ENSURE PLUS) LIQD Take 237 mLs by mouth 3 (three) times daily between meals. (Patient not taking: Reported on 05/02/2022) 237 mL 12   folic acid (FOLVITE) 1 MG tablet Take 1 tablet (1 mg total) by mouth daily. 30 tablet 0   KRILL OIL PO Take 1 tablet by mouth daily.     linaclotide (LINZESS) 145 MCG CAPS capsule Take 1 capsule (145 mcg total) by mouth daily before breakfast. 30 capsule 3   Multiple Vitamin (MULTIVITAMIN PO) Take by mouth.     Multiple Vitamin (MULTIVITAMIN) tablet Take 1 tablet by mouth daily.     OVER THE COUNTER MEDICATION Hair, Skin and Nails     POTASSIUM PO Take by mouth.     vitamin C (ASCORBIC ACID) 500 MG tablet Take 500 mg by mouth daily.     acyclovir (ZOVIRAX) 400 MG tablet TAKE 1 TABLET BY MOUTH 3 TIMES DAILY FOR 5 DAYS. (Patient not taking: Reported on 05/02/2022) 15 tablet 0   metoprolol succinate (TOPROL-XL) 100 MG 24 hr tablet Take 1 tablet (100 mg total) by mouth daily. Take with or immediately following a meal. 90 tablet 1   pantoprazole (PROTONIX) 40 MG tablet Take 1 tablet (40 mg total) by mouth daily. 90 tablet 1   telmisartan-hydrochlorothiazide (MICARDIS HCT) 80-25 MG tablet Take 1 tablet by mouth daily. 90 tablet 1   No facility-administered medications prior to visit.    Allergies  Allergen Reactions   Atorvastatin     Muscle cramps    Fenofibrate     "Locked her muscles up"   Hydralazine     "kidney pain"   Trazodone Hcl Other (See Comments)    headache   Latex Rash    Or just redness     ROS    See HPI Objective:    Physical Exam Constitutional:      General: She is not in acute distress.    Appearance: Normal appearance. She is well-developed.  HENT:     Head: Normocephalic and atraumatic.     Right Ear: External ear normal.     Left Ear: External ear normal.  Eyes:     General: No scleral icterus. Neck:     Thyroid: No thyromegaly.  Cardiovascular:     Rate and Rhythm: Normal rate and regular rhythm.     Heart sounds: Normal heart sounds. No murmur heard. Pulmonary:     Effort: Pulmonary effort is normal. No respiratory distress.     Breath sounds: Normal breath sounds. No wheezing.  Musculoskeletal:     Cervical back: Neck supple.  Skin:    General: Skin is warm and dry.  Neurological:     Mental Status: She is alert and oriented to person, place, and time.  Psychiatric:        Mood and Affect: Mood normal.        Behavior: Behavior normal.        Thought Content: Thought content normal.        Judgment: Judgment normal.      BP 133/83 (BP Location: Right Arm, Cuff Size: Normal)   Pulse 71   Temp 97.8 F (36.6 C) (Oral)   Resp 12  Ht 5' 3.5" (1.613 m)   Wt 147 lb 9.6 oz (67 kg)   LMP 03/04/2002   SpO2 98%   BMI 25.74 kg/m  Wt Readings from Last 3 Encounters:  10/23/22 147 lb 9.6 oz (67 kg)  05/02/22 140 lb (63.5 kg)  03/20/22 137 lb (62.1 kg)       Assessment & Plan:   Problem List Items Addressed This Visit       Unprioritized   Primary hypertension     Well controlled on Micardis HCT and Metoprolol. -Continue current medications.       Relevant Medications   telmisartan-hydrochlorothiazide (MICARDIS HCT) 80-25 MG tablet   metoprolol succinate (TOPROL-XL) 100 MG 24 hr tablet   Irritable bowel syndrome with constipation     Reports improvement with Linzess,  using it as needed. -Continue Linzess as needed.        Relevant Medications   pantoprazole (PROTONIX) 40 MG tablet   Hypertriglyceridemia - Primary     History of elevated triglycerides, last noted to be 275mg . . Patient has eaten today, so will defer lipid panel to next visit. -Continue dietary modifications, focusing on reducing carbohydrates and sweets. -Plan to recheck lipid panel at next visit.   Lab Results  Component Value Date   CHOL 210 (H) 03/20/2022   HDL 58.90 03/20/2022   LDLDIRECT 107.0 03/20/2022   TRIG 275.0 (H) 03/20/2022   CHOLHDL 4 03/20/2022         Relevant Medications   telmisartan-hydrochlorothiazide (MICARDIS HCT) 80-25 MG tablet   metoprolol succinate (TOPROL-XL) 100 MG 24 hr tablet   Other Relevant Orders   HgB A1c (Completed)   Basic Metabolic Panel (BMET) (Completed)   History of GI bleed    On Pantoprazole, no reported issues. -Continue Pantoprazole daily.       Relevant Medications   pantoprazole (PROTONIX) 40 MG tablet   Depression    Stable. She is enjoying having her daughter living with her.       Chronic kidney disease, stage 3a (HCC)    Update bmet today.       -Encouraged to get flu shot and COVID booster at pharmacy this fall. -Consider shingles vaccine at pharmacy due to Medicare coverage. -Check diabetes test and electrolytes today. -Refill prescriptions as needed.   I have discontinued Matie L. Pirani's acyclovir. I am also having her maintain her multivitamin, Calcium Carbonate-Vitamin D (CALCIUM + D PO), ascorbic acid, KRILL OIL PO, feeding supplement, folic acid, cyanocobalamin, Collagen-Boron-Hyaluronic Acid (MOVE FREE ULTRA JOINT HEALTH PO), linaclotide, OVER THE COUNTER MEDICATION, Multiple Vitamin (MULTIVITAMIN PO), POTASSIUM PO, estradiol, telmisartan-hydrochlorothiazide, metoprolol succinate, and pantoprazole.  Meds ordered this encounter  Medications   telmisartan-hydrochlorothiazide (MICARDIS HCT) 80-25 MG tablet     Sig: Take 1 tablet by mouth daily.    Dispense:  90 tablet    Refill:  1   metoprolol succinate (TOPROL-XL) 100 MG 24 hr tablet    Sig: Take 1 tablet (100 mg total) by mouth daily. Take with or immediately following a meal.    Dispense:  90 tablet    Refill:  1   pantoprazole (PROTONIX) 40 MG tablet    Sig: Take 1 tablet (40 mg total) by mouth daily.    Dispense:  90 tablet    Refill:  1

## 2022-10-23 NOTE — Assessment & Plan Note (Signed)
Stable. She is enjoying having her daughter living with her.

## 2022-10-23 NOTE — Assessment & Plan Note (Signed)
Update bmet today

## 2022-10-24 ENCOUNTER — Telehealth: Payer: Self-pay | Admitting: Family

## 2022-10-24 DIAGNOSIS — E119 Type 2 diabetes mellitus without complications: Secondary | ICD-10-CM

## 2022-10-24 DIAGNOSIS — N1831 Chronic kidney disease, stage 3a: Secondary | ICD-10-CM

## 2022-10-24 NOTE — Assessment & Plan Note (Signed)
On Pantoprazole, no reported issues. -Continue Pantoprazole daily.

## 2022-10-24 NOTE — Assessment & Plan Note (Signed)
  History of elevated triglycerides, last noted to be 275mg . . Patient has eaten today, so will defer lipid panel to next visit. -Continue dietary modifications, focusing on reducing carbohydrates and sweets. -Plan to recheck lipid panel at next visit.   Lab Results  Component Value Date   CHOL 210 (H) 03/20/2022   HDL 58.90 03/20/2022   LDLDIRECT 107.0 03/20/2022   TRIG 275.0 (H) 03/20/2022   CHOLHDL 4 03/20/2022

## 2022-10-24 NOTE — Assessment & Plan Note (Signed)
  Reports improvement with Linzess, using it as needed. -Continue Linzess as needed.

## 2022-10-24 NOTE — Telephone Encounter (Signed)
Please advise patient her sugar is officially in the diabetes range. No need for medicine, just continue to work on a healthy reduced sugar diet and regular exercise.   Her kidney function has been mildly reduced for some time, but it is slighty lower than last visit. I would like for her to see nephrology for consultation.

## 2022-10-25 NOTE — Telephone Encounter (Signed)
Patient notified of results, new recommendations and referral. She verbalized understanding

## 2022-12-05 ENCOUNTER — Other Ambulatory Visit: Payer: Self-pay | Admitting: *Deleted

## 2022-12-05 DIAGNOSIS — Z79890 Hormone replacement therapy (postmenopausal): Secondary | ICD-10-CM

## 2022-12-05 MED ORDER — ESTRADIOL 0.5 MG PO TABS: ORAL_TABLET | ORAL | 0 refills | Status: DC

## 2022-12-05 NOTE — Telephone Encounter (Signed)
Patient left message on triage line requesting refill of estradiol.

## 2022-12-05 NOTE — Telephone Encounter (Signed)
Medication refill request: estrace  Last AEX:  01/10/22  Next AEX: not scheduled  Last MMG (if hormonal medication request): 01/10/22 Bi-rads 2 benign  Refill authorized: #[id number] no refills pended for today

## 2022-12-30 DIAGNOSIS — N2581 Secondary hyperparathyroidism of renal origin: Secondary | ICD-10-CM | POA: Diagnosis not present

## 2022-12-30 DIAGNOSIS — D631 Anemia in chronic kidney disease: Secondary | ICD-10-CM | POA: Diagnosis not present

## 2022-12-30 DIAGNOSIS — E781 Pure hyperglyceridemia: Secondary | ICD-10-CM | POA: Diagnosis not present

## 2022-12-30 DIAGNOSIS — N1831 Chronic kidney disease, stage 3a: Secondary | ICD-10-CM | POA: Diagnosis not present

## 2022-12-30 DIAGNOSIS — I129 Hypertensive chronic kidney disease with stage 1 through stage 4 chronic kidney disease, or unspecified chronic kidney disease: Secondary | ICD-10-CM | POA: Diagnosis not present

## 2022-12-30 DIAGNOSIS — N183 Chronic kidney disease, stage 3 unspecified: Secondary | ICD-10-CM | POA: Diagnosis not present

## 2023-01-02 ENCOUNTER — Other Ambulatory Visit: Payer: Self-pay | Admitting: Radiology

## 2023-01-02 DIAGNOSIS — Z1231 Encounter for screening mammogram for malignant neoplasm of breast: Secondary | ICD-10-CM

## 2023-01-03 ENCOUNTER — Other Ambulatory Visit: Payer: Self-pay | Admitting: Nephrology

## 2023-01-03 DIAGNOSIS — N1831 Chronic kidney disease, stage 3a: Secondary | ICD-10-CM

## 2023-01-06 ENCOUNTER — Inpatient Hospital Stay
Admission: RE | Admit: 2023-01-06 | Discharge: 2023-01-06 | Discharge status: Home / Self Care | Payer: Medicare PPO | Source: Ambulatory Visit | Attending: Nephrology | Admitting: Nephrology

## 2023-01-06 DIAGNOSIS — N183 Chronic kidney disease, stage 3 unspecified: Secondary | ICD-10-CM | POA: Diagnosis not present

## 2023-01-06 DIAGNOSIS — N1831 Chronic kidney disease, stage 3a: Secondary | ICD-10-CM

## 2023-01-23 ENCOUNTER — Ambulatory Visit: Payer: Medicare PPO

## 2023-02-07 ENCOUNTER — Ambulatory Visit: Payer: Medicare PPO

## 2023-02-18 ENCOUNTER — Ambulatory Visit (INDEPENDENT_AMBULATORY_CARE_PROVIDER_SITE_OTHER): Payer: Medicare PPO

## 2023-02-18 VITALS — Ht 63.5 in | Wt 147.0 lb

## 2023-02-18 DIAGNOSIS — Z0000 Encounter for general adult medical examination without abnormal findings: Secondary | ICD-10-CM

## 2023-02-18 NOTE — Progress Notes (Signed)
Subjective:   [name] is a 77 y.o. female who presents for Medicare Annual (Subsequent) preventive examination.  Visit Complete: Virtual I connected with  Al Decant on 02/18/23 by a audio enabled telemedicine application and verified that I am speaking with the correct person using two identifiers.  Patient Location: Home  Provider Location: Home Office  I discussed the limitations of evaluation and management by telemedicine. The patient expressed understanding and agreed to proceed.  Vital Signs: Because this visit was a virtual/telehealth visit, some criteria may be missing or patient reported. Any vitals not documented were not able to be obtained and vitals that have been documented are patient reported.    Cardiac Risk Factors include: advanced age (>59men, >40 women);hypertension     Objective:    Today's Vitals   02/18/23 1057  Weight: 147 lb (66.7 kg)  Height: 5' 3.5" (1.613 m)   Body mass index is 25.63 kg/m.     02/18/2023   11:04 AM 02/13/2022    9:35 AM 09/21/2021   12:16 PM 09/20/2021    5:15 PM 09/20/2021    3:26 PM 09/19/2021   12:24 PM 02/09/2021    1:07 PM  Advanced Directives  Does Patient Have a Medical Advance Directive? Yes Yes Yes  No Yes Yes  Type of Estate agent of [place];Living will Healthcare Power of [place];Living will Living will  Healthcare Power of [place];Living will Healthcare Power of [place];Living will Healthcare Power of [place];Living will  Does patient want to make changes to medical advance directive?     Yes (Inpatient - patient requests chaplain consult to change a medical advance directive)    Copy of Healthcare Power of Attorney in Chart? No - copy requested No - copy requested     No - copy requested  Would patient like information on creating a medical advance directive?    Yes (Inpatient - patient requests chaplain consult to create a medical advance directive)       Current  Medications (verified) Outpatient Encounter Medications as of 02/18/2023  Medication Sig   Calcium Carbonate-Vitamin D (CALCIUM + D PO) Take 2 tablets by mouth daily.    Collagen-Boron-Hyaluronic Acid (MOVE FREE ULTRA JOINT HEALTH PO) Take by mouth.   cyanocobalamin (VITAMIN B12) 1000 MCG tablet Take 1 tablet (1,000 mcg total) by mouth daily.   estradiol (ESTRACE) 0.5 MG tablet TAKE 1/2 TABLET BY MOUTH DAILY   feeding supplement (ENSURE ENLIVE / ENSURE PLUS) LIQD Take 237 mLs by mouth 3 (three) times daily between meals. (Patient not taking: Reported on 05/02/2022)   folic acid (FOLVITE) 1 MG tablet Take 1 tablet (1 mg total) by mouth daily.   KRILL OIL PO Take 1 tablet by mouth daily.   linaclotide (LINZESS) 145 MCG CAPS capsule Take 1 capsule (145 mcg total) by mouth daily before breakfast.   metoprolol succinate (TOPROL-XL) 100 MG 24 hr tablet Take 1 tablet (100 mg total) by mouth daily. Take with or immediately following a meal.   Multiple Vitamin (MULTIVITAMIN PO) Take by mouth.   Multiple Vitamin (MULTIVITAMIN) tablet Take 1 tablet by mouth daily.   OVER THE COUNTER MEDICATION Hair, Skin and Nails   pantoprazole (PROTONIX) 40 MG tablet Take 1 tablet (40 mg total) by mouth daily.   POTASSIUM PO Take by mouth.   telmisartan-hydrochlorothiazide (MICARDIS HCT) 80-25 MG tablet Take 1 tablet by mouth daily.   vitamin C (ASCORBIC ACID) 500 MG tablet Take 500 mg by mouth daily.  No facility-administered encounter medications on file as of 02/18/2023.    Allergies (verified) Atorvastatin, Fenofibrate, Hydralazine, Trazodone hcl, and Latex   History: Past Medical History:  Diagnosis Date   Cervical dysplasia    Endometrial hyperplasia    Fibroid    Atypical leiomyoma   History of colon polyps    Hyperglycemia 05/23/2014   Hyperglycemia    Hypertension    Past Surgical History:  Procedure Laterality Date   ABDOMINAL HYSTERECTOMY  2004   TAH_BSO   BIOPSY  09/21/2021   Procedure:  BIOPSY;  Surgeon: Jeani Hawking, MD;  Location: [facility];  Service: Gastroenterology;;   BREAST BIOPSY Left 2000   BREAST BIOPSY Bilateral 1999   BREAST EXCISIONAL BIOPSY Left    benign   BREAST SURGERY  1998   Benign left breast lump   COLPOSCOPY     ESOPHAGOGASTRODUODENOSCOPY (EGD) WITH PROPOFOL N/A 09/21/2021   Procedure: ESOPHAGOGASTRODUODENOSCOPY (EGD) WITH PROPOFOL;  Surgeon: Jeani Hawking, MD;  Location: [facility];  Service: Gastroenterology;  Laterality: N/A;   EXCISION MASS LOWER EXTREMETIES Right 05/30/2016   Procedure: EXCISION RIGHT THIGH MASS;  Surgeon: Harriette Bouillon, MD;  Location: [facility];  Service: General;  Laterality: Right;  EXCISION RIGHT THIGH MASS   HEMORRHOID SURGERY  1980's   LYMPH NODE BIOPSY Left 1975   ? "removed lymph nodes in left leg"--benign per pt   OOPHORECTOMY     BSO   TONSILLECTOMY  1990   Family History  Problem Relation Age of Onset   Hypertension Mother    Heart disease Mother    Diabetes Mother    Breast cancer Mother 22   Heart disease Father    Cancer Father        prostate   Cancer Sister 74       Leukemia, deceased   Breast cancer Maternal Aunt        over 95   Breast cancer Maternal Grandmother        over 35   Hypertension Brother    Diabetes Brother    Stroke Brother    Social History   Socioeconomic History   Marital status: Widowed    Spouse name: Not on file   Number of children: Not on file   Years of education: Not on file   Highest education level: Not on file  Occupational History   Not on file  Tobacco Use   Smoking status: Former    Current packs/day: 0.00    Average packs/day: 0.3 packs/day for 30.0 years (7.5 ttl pk-yrs)    Types: Cigarettes    Start date: 39    Quit date: 2013    Years since quitting: 11.9   Smokeless tobacco: Never  Vaping Use   Vaping status: Never Used  Substance and Sexual Activity   Alcohol use: Yes    Alcohol/week: 0.0 standard drinks of alcohol    Comment: 2-4 glasses  of wine on weekends   Drug use: No   Sexual activity: Not Currently    Partners: Male    Birth control/protection: Surgical    Comment: 1st intercourse 77 yo-fewer than 5 partners, HX STI (HSV) GCG High Risk Medicare  Other Topics Concern   Not on file  Social History Narrative   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   Enjoys reading, yard work, cares for her  mother during the day, her brother helps during then night, enjoys poker   1 daughter (in [place]) and 2 grand-daughters (In [place]- age 66 and 66)   Social Drivers of Corporate investment banker Strain: Low Risk  (02/18/2023)   Overall Financial Resource Strain (CARDIA)    Difficulty of Paying Living Expenses: Not hard at all  Food Insecurity: No Food Insecurity (02/18/2023)   Hunger Vital Sign    Worried About Running Out of Food in the Last Year: Never true    Ran Out of Food in the Last Year: Never true  Transportation Needs: No Transportation Needs (02/18/2023)   PRAPARE - Administrator, Civil Service (Medical): No    Lack of Transportation (Non-Medical): No  Physical Activity: Insufficiently Active (02/18/2023)   Exercise Vital Sign    Days of Exercise per Week: 2 days    Minutes of Exercise per Session: 20 min  Stress: No Stress Concern Present (02/18/2023)   [employer] of Occupational Health - Occupational Stress Questionnaire    Feeling of Stress : Not at all  Social Connections: Socially Isolated (02/18/2023)   Social Connection and Isolation Panel [NHANES]    Frequency of Communication with Friends and Family: More than three times a week    Frequency of Social Gatherings with Friends and Family: More than three times a week    Attends Religious Services: Never    Database administrator or Organizations: No    Attends Banker Meetings: Never    Marital Status: Widowed     Tobacco Counseling Counseling given: Not Answered   Clinical Intake:  Pre-visit preparation completed: Yes  Pain : No/denies pain     BMI - recorded: 25.63 Nutritional Status: BMI 25 -29 Overweight Nutritional Risks: None Diabetes: No  How often do you need to have someone help you when you read instructions, pamphlets, or other written materials from your doctor or pharmacy?: 1 - Never  Interpreter Needed?: No  Information entered by :: Theresa Mulligan LPN   Activities of Daily Living    02/18/2023   11:03 AM  In your present state of health, do you have any difficulty performing the following activities:  Hearing? 0  Vision? 0  Difficulty concentrating or making decisions? 0  Walking or climbing stairs? 1  Comment Uses a Cane  Dressing or bathing? 0  Doing errands, shopping? 0  Preparing Food and eating ? N  Using the Toilet? N  In the past six months, have you accidently leaked urine? N  Do you have problems with loss of bowel control? N  Managing your Medications? N  Managing your Finances? N  Housekeeping or managing your Housekeeping? N    Patient Care Team: Sandford Craze, NP as PCP - General (Internal Medicine) Charna Elizabeth, MD as Consulting Physician (Gastroenterology) Donnamarie Rossetti, MD as Consulting Physician (Dentistry) Olivia Mackie, NP as Nurse Practitioner (Gynecology)  Indicate any recent Medical Services you may have received from other than [facility] providers in the past year (date may be approximate).     Assessment:   This is a routine wellness examination for [place].  Hearing/Vision screen Hearing Screening - Comments:: Denies hearing difficulties   Vision Screening - Comments:: Wears rx glasses - up to date with routine eye exams with  [facility]   Goals Addressed               This Visit's Progress     Stay Active (  pt-stated)         Depression Screen    02/18/2023   11:02 AM 10/23/2022   11:57 AM  02/13/2022    9:32 AM 02/09/2021    1:10 PM 12/22/2020   10:29 AM 08/24/2019   10:15 AM 03/23/2019   10:24 AM  PHQ 2/9 Scores  PHQ - 2 Score 0 0 1 1 0 0 2  PHQ- 9 Score  2     2    Fall Risk    02/18/2023   11:04 AM 10/23/2022   11:57 AM 10/23/2022   11:12 AM 05/02/2022    1:56 PM 02/13/2022    9:35 AM  Fall Risk   Falls in the past year? 0 0 0 1 1  Number falls in past yr: 0 0 0 1 1  Injury with Fall? 0 0 0 1 0  Comment    fell 04/2022 picking up a case of water and hit granite counter top   Risk for fall due to : No Fall Risks No Fall Risks No Fall Risks History of fall(s) Impaired vision  Follow up Falls prevention discussed Falls evaluation completed   Falls prevention discussed    MEDICARE RISK AT HOME: Medicare Risk at Home Any stairs in or around the home?: No If so, are there any without handrails?: No Home free of loose throw rugs in walkways, pet beds, electrical cords, etc?: Yes Adequate lighting in your home to reduce risk of falls?: Yes Life alert?: No Use of a cane, walker or w/c?: Yes Grab bars in the bathroom?: Yes Shower chair or bench in shower?: Yes Elevated toilet seat or a handicapped toilet?: Yes  TIMED UP AND GO:  Was the test performed?  No    Cognitive Function:    09/22/2015    8:56 AM  MMSE - Mini Mental State Exam  Orientation to time 5  Orientation to Place 5  Registration 3  Attention/ Calculation 5  Recall 3  Language- name 2 objects 2  Language- repeat 1  Language- follow 3 step command 3  Language- read & follow direction 1  Write a sentence 1  Copy design 1  Total score 30        02/18/2023   11:04 AM 02/13/2022    9:36 AM  6CIT Screen  What Year? 0 points 0 points  What month? 0 points 0 points  What time? 0 points 0 points  Count back from 20 0 points 2 points  Months in reverse 0 points 4 points  Repeat phrase 0 points 2 points  Total Score 0 points 8 points    Immunizations Immunization History  Administered  Date(s) Administered   PFIZER(Purple Top)SARS-COV-2 Vaccination 05/06/2019, 06/02/2019   Pneumococcal Conjugate-13 12/07/2021   Pneumococcal Polysaccharide-23 11/20/2018   Td 09/19/2014   Zoster, Live 03/04/2012    TDAP status: Up to date  Flu Vaccine status: Declined, Education has been provided regarding the importance of this vaccine but patient still declined. Advised may receive this vaccine at local pharmacy or Health Dept. Aware to provide a copy of the vaccination record if obtained from local pharmacy or Health Dept. Verbalized acceptance and understanding.  Pneumococcal vaccine status: Up to date  Covid-19 vaccine status: Declined, Education has been provided regarding the importance of this vaccine but patient still declined. Advised may receive this vaccine at local pharmacy or Health Dept.or vaccine clinic. Aware to provide a copy of the vaccination record if obtained from local pharmacy or  Health Dept. Verbalized acceptance and understanding.  Qualifies for Shingles Vaccine? Yes   Zostavax completed No   Shingrix Completed?: No.    Education has been provided regarding the importance of this vaccine. Patient has been advised to call insurance company to determine out of pocket expense if they have not yet received this vaccine. Advised may also receive vaccine at local pharmacy or Health Dept. Verbalized acceptance and understanding.  Screening Tests Health Maintenance  Topic Date Due   Diabetic kidney evaluation - Urine ACR  Never done   Zoster Vaccines- Shingrix (1 of 2) 01/17/1998   COVID-19 Vaccine (3 - 2024-25 season) 11/03/2022   INFLUENZA VACCINE  06/02/2026 (Originally 10/03/2022)   Diabetic kidney evaluation - eGFR measurement  10/23/2023   Medicare Annual Wellness (AWV)  02/18/2024   DTaP/Tdap/Td (2 - Tdap) 09/18/2024   Colonoscopy  07/10/2027   Pneumonia Vaccine 61+ Years old  Completed   DEXA SCAN  Completed   Hepatitis C Screening  Completed   HPV VACCINES   Aged Out    Health Maintenance  Health Maintenance Due  Topic Date Due   Diabetic kidney evaluation - Urine ACR  Never done   Zoster Vaccines- Shingrix (1 of 2) 01/17/1998   COVID-19 Vaccine (3 - 2024-25 season) 11/03/2022    Colorectal cancer screening: Type of screening: Colonoscopy. Completed 07/10/22. Repeat every 5 years    Bone Density status: Completed 10/16/16. Results reflect: Bone density results: NORMAL. Repeat every   years.    Additional Screening:  Hepatitis C Screening: does qualify; Completed 07/04/20  Vision Screening: Recommended annual ophthalmology exams for early detection of glaucoma and other disorders of the eye. Is the patient up to date with their annual eye exam?  Yes  Who is the provider or what is the name of the office in which the patient attends annual eye exams? [facility] If pt is not established with a provider, would they like to be referred to a provider to establish care? No .   Dental Screening: Recommended annual dental exams for proper oral hygiene    Community Resource Referral / Chronic Care Management:  CRR required this visit?  No   CCM required this visit?  No     Plan:     I have personally reviewed and noted the following in the patient's chart:   Medical and social history Use of alcohol, tobacco or illicit drugs  Current medications and supplements including opioid prescriptions. Patient is not currently taking opioid prescriptions. Functional ability and status Nutritional status Physical activity Advanced directives List of other physicians Hospitalizations, surgeries, and ER visits in previous 12 months Vitals Screenings to include cognitive, depression, and falls Referrals and appointments  In addition, I have reviewed and discussed with patient certain preventive protocols, quality metrics, and best practice recommendations. A written personalized care plan for preventive services as well as general  preventive health recommendations were provided to patient.     Tillie Rung, LPN   [id number]/12/[id number]   After Visit Summary: (MyChart) Due to this being a telephonic visit, the after visit summary with patients personalized plan was offered to patient via MyChart   Nurse Notes: None

## 2023-02-18 NOTE — Patient Instructions (Addendum)
Ms. Munro , Thank you for taking time to come for your Medicare Wellness Visit. I appreciate your ongoing commitment to your health goals. Please review the following plan we discussed and let me know if I can assist you in the future.   Referrals/Orders/Follow-Ups/Clinician Recommendations:   This is a list of the screening recommended for you and due dates:  Health Maintenance  Topic Date Due   Yearly kidney health urinalysis for diabetes  Never done   Zoster (Shingles) Vaccine (1 of 2) 01/17/1998   COVID-19 Vaccine (3 - 2024-25 season) 11/03/2022   Flu Shot  06/02/2026*   Yearly kidney function blood test for diabetes  10/23/2023   Medicare Annual Wellness Visit  02/18/2024   DTaP/Tdap/Td vaccine (2 - Tdap) 09/18/2024   Colon Cancer Screening  07/10/2027   Pneumonia Vaccine  Completed   DEXA scan (bone density measurement)  Completed   Hepatitis C Screening  Completed   HPV Vaccine  Aged Out  *Topic was postponed. The date shown is not the original due date.    Advanced directives: (Copy Requested) Please bring a copy of your health care power of attorney and living will to the office to be added to your chart at your convenience.  Next Medicare Annual Wellness Visit scheduled for next year: Yes

## 2023-04-25 ENCOUNTER — Ambulatory Visit: Payer: Medicare PPO | Admitting: Family

## 2023-05-15 ENCOUNTER — Other Ambulatory Visit: Payer: Self-pay | Admitting: Radiology

## 2023-05-15 DIAGNOSIS — Z79890 Hormone replacement therapy (postmenopausal): Secondary | ICD-10-CM

## 2023-05-15 NOTE — Telephone Encounter (Signed)
 Medication refill request: estrace  Last AEX:  01/10/22  Next AEX: 07/01/23 Last MMG (if hormonal medication request): 01/10/22 Bi-rads 2 benign  Refill authorized: #[id number] no refills pended for today

## 2023-05-16 ENCOUNTER — Encounter: Payer: Medicare PPO | Admitting: Radiology

## 2023-05-27 ENCOUNTER — Ambulatory Visit: Payer: Medicare PPO | Admitting: Family

## 2023-06-27 ENCOUNTER — Ambulatory Visit: Admitting: Family

## 2023-07-01 ENCOUNTER — Encounter: Admitting: Radiology

## 2023-07-22 DIAGNOSIS — Z135 Encounter for screening for eye and ear disorders: Secondary | ICD-10-CM | POA: Diagnosis not present

## 2023-07-22 DIAGNOSIS — H2513 Age-related nuclear cataract, bilateral: Secondary | ICD-10-CM | POA: Diagnosis not present

## 2023-07-22 DIAGNOSIS — H524 Presbyopia: Secondary | ICD-10-CM | POA: Diagnosis not present

## 2023-08-01 ENCOUNTER — Telehealth: Payer: Self-pay | Admitting: Family

## 2023-08-01 ENCOUNTER — Other Ambulatory Visit: Payer: Self-pay | Admitting: Family

## 2023-08-01 ENCOUNTER — Ambulatory Visit: Payer: Self-pay | Admitting: Family

## 2023-08-01 ENCOUNTER — Ambulatory Visit: Admitting: Family

## 2023-08-01 VITALS — BP 164/88 | HR 75 | Temp 97.6°F | Resp 16 | Ht 63.5 in | Wt 146.0 lb

## 2023-08-01 DIAGNOSIS — I10 Essential (primary) hypertension: Secondary | ICD-10-CM | POA: Diagnosis not present

## 2023-08-01 DIAGNOSIS — E119 Type 2 diabetes mellitus without complications: Secondary | ICD-10-CM | POA: Diagnosis not present

## 2023-08-01 DIAGNOSIS — K581 Irritable bowel syndrome with constipation: Secondary | ICD-10-CM | POA: Diagnosis not present

## 2023-08-01 DIAGNOSIS — E1122 Type 2 diabetes mellitus with diabetic chronic kidney disease: Secondary | ICD-10-CM

## 2023-08-01 DIAGNOSIS — Z7984 Long term (current) use of oral hypoglycemic drugs: Secondary | ICD-10-CM | POA: Diagnosis not present

## 2023-08-01 DIAGNOSIS — E559 Vitamin D deficiency, unspecified: Secondary | ICD-10-CM

## 2023-08-01 DIAGNOSIS — E781 Pure hyperglyceridemia: Secondary | ICD-10-CM | POA: Diagnosis not present

## 2023-08-01 LAB — LIPID PANEL
Cholesterol: 192 mg/dL (ref 0–200)
HDL: 64 mg/dL (ref >39.00)
LDL Cholesterol: 97 mg/dL (ref 0–99)
NonHDL: 127.88
Total CHOL/HDL Ratio: 3
Triglycerides: 153 mg/dL — ABNORMAL HIGH (ref 0.0–149.0)
VLDL: 30.6 mg/dL (ref 0.0–40.0)

## 2023-08-01 LAB — COMPREHENSIVE METABOLIC PANEL WITH GFR
ALT: 9 U/L (ref 0–35)
AST: 24 U/L (ref 0–37)
Albumin: 4 g/dL (ref 3.5–5.2)
Alkaline Phosphatase: 50 U/L (ref 39–117)
BUN: 15 mg/dL (ref 6–23)
CO2: 27 meq/L (ref 19–32)
Calcium: 9.7 mg/dL (ref 8.4–10.5)
Chloride: 102 meq/L (ref 96–112)
Creatinine, Ser: 0.9 mg/dL (ref 0.40–1.20)
GFR: 62.53 mL/min (ref >60.00)
Glucose, Bld: 112 mg/dL — ABNORMAL HIGH (ref 70–99)
Potassium: 4.2 meq/L (ref 3.5–5.1)
Sodium: 140 meq/L (ref 135–145)
Total Bilirubin: 0.9 mg/dL (ref 0.2–1.2)
Total Protein: 6.7 g/dL (ref 6.0–8.3)

## 2023-08-01 LAB — HEMOGLOBIN A1C: Hgb A1c MFr Bld: 5.6 % (ref 4.6–6.5)

## 2023-08-01 MED ORDER — METOPROLOL SUCCINATE ER 100 MG PO TB24: 100.0000 mg | ORAL_TABLET | Freq: Every day | ORAL | 0 refills | Status: DC

## 2023-08-01 MED ORDER — TELMISARTAN-HCTZ 80-25 MG PO TABS: 1.0000 | ORAL_TABLET | Freq: Every day | ORAL | 1 refills | Status: DC

## 2023-08-01 MED ORDER — VITAMIN D3 25 MCG (1000 UT) PO CAPS: 1000.0000 [IU] | ORAL_CAPSULE | Freq: Every day | ORAL | Status: DC

## 2023-08-01 NOTE — Assessment & Plan Note (Signed)
 On otc supplement 1000 mcg daily. Update level.

## 2023-08-01 NOTE — Telephone Encounter (Signed)
 Please call [facility] to request copy of DM eye.

## 2023-08-01 NOTE — Assessment & Plan Note (Signed)
 Uses linzess  prn which is very helpful for her.

## 2023-08-01 NOTE — Telephone Encounter (Signed)
 Copied from CRM [id number]. Topic: Clinical - Medication Question >> Aug 01, 2023  2:55 PM [name] wrote: Reason for CRM: Patient calling because she forgot to mention to [facility] at her appt today that she needs her metoprolol  succinate (TOPROL -XL) 100 MG 24 hr tablet renewed.

## 2023-08-01 NOTE — Assessment & Plan Note (Addendum)
 Lab Results  Component Value Date   CHOL 210 (H) 03/20/2022   HDL 58.90 03/20/2022   LDLDIRECT 107.0 03/20/2022   TRIG 275.0 (H) 03/20/2022   CHOLHDL 4 03/20/2022  Update lipid panel. Intolerant to statins.

## 2023-08-01 NOTE — Progress Notes (Signed)
 Subjective:     Patient ID: [name], female    DOB: 15-Jan-1946, 78 y.o.   MRN: [id number]  Chief Complaint  Patient presents with   Hypertension    Here for follow up   Chronic Kidney Disease    HPI  Discussed the use of AI scribe software for clinical note transcription with the patient, who gave verbal consent to proceed.  History of Present Illness  [name] is a 78 year old female with hypertension and diabetes who presents for a regular follow-up visit.  Her nephrologist recently changed her blood pressure medication to Farxiga, which causes a burning sensation in her kidneys. She has been unable to contact the nephrologist's office and did not take her blood pressure medication this morning. She is dissatisfied with the current medication and the service from the nephrologist's office. Previously, she was on telmisartan  hydrochlorothiazide.  It looks like her nephrologist stopped hydrochlorothiazide when she started farxiga.   She takes a vitamin D  supplement, approximately 1000 units, and krill oil, which she prefers over fish oil . Linzess  is used as needed for bowel movements and is effective. Her last A1c was 6.5. She has not been on statins due to muscle pain with previous attempts. She is sensitive to medications and cautious about new treatments.  She recently had an eye exam and is getting new glasses. No burning during urination but reports a burning sensation in the flank area after taking Farxiga.      Health Maintenance Due  Topic Date Due   FOOT EXAM  Never done   OPHTHALMOLOGY EXAM  Never done   Diabetic kidney evaluation - Urine ACR  Never done   Zoster Vaccines- Shingrix  (1 of 2) 01/17/1998   COVID-19 Vaccine (3 - 2024-25 season) 11/03/2022   HEMOGLOBIN A1C  04/25/2023    Past Medical History:  Diagnosis Date   Cervical dysplasia    Endometrial hyperplasia    Fibroid    Atypical leiomyoma   History of colon polyps    Hyperglycemia  05/23/2014   Hyperglycemia    Hypertension     Past Surgical History:  Procedure Laterality Date   ABDOMINAL HYSTERECTOMY  2004   TAH_BSO   BIOPSY  09/21/2021   Procedure: BIOPSY;  Surgeon: Alvis Jourdain, MD;  Location: [facility];  Service: Gastroenterology;;   BREAST BIOPSY Left 2000   BREAST BIOPSY Bilateral 1999   BREAST EXCISIONAL BIOPSY Left    benign   BREAST SURGERY  1998   Benign left breast lump   COLPOSCOPY     ESOPHAGOGASTRODUODENOSCOPY (EGD) WITH PROPOFOL  N/A 09/21/2021   Procedure: ESOPHAGOGASTRODUODENOSCOPY (EGD) WITH PROPOFOL ;  Surgeon: Alvis Jourdain, MD;  Location: [facility];  Service: Gastroenterology;  Laterality: N/A;   EXCISION MASS LOWER EXTREMETIES Right 05/30/2016   Procedure: EXCISION RIGHT THIGH MASS;  Surgeon: Sim Dryer, MD;  Location: [facility];  Service: General;  Laterality: Right;  EXCISION RIGHT THIGH MASS   HEMORRHOID SURGERY  1980's   LYMPH NODE BIOPSY Left 1975   ? "removed lymph nodes in left leg"--benign per pt   OOPHORECTOMY     BSO   TONSILLECTOMY  1990    Family History  Problem Relation Age of Onset   Hypertension Mother    Heart disease Mother    Diabetes Mother    Breast cancer Mother 78   Heart disease Father    Cancer Father        prostate   Cancer Sister 51  Leukemia, deceased   Breast cancer Maternal Aunt        over 75   Breast cancer Maternal Grandmother        over 24   Hypertension Brother    Diabetes Brother    Stroke Brother     Social History   Socioeconomic History   Marital status: Widowed    Spouse name: Not on file   Number of children: Not on file   Years of education: Not on file   Highest education level: Not on file  Occupational History   Not on file  Tobacco Use   Smoking status: Former    Current packs/day: 0.00    Average packs/day: 0.3 packs/day for 30.0 years (7.5 ttl pk-yrs)    Types: Cigarettes    Start date: 101    Quit date: 2013    Years since quitting: 12.4    Smokeless tobacco: Never  Vaping Use   Vaping status: Never Used  Substance and Sexual Activity   Alcohol use: Yes    Alcohol/week: 0.0 standard drinks of alcohol    Comment: 2-4 glasses of wine on weekends   Drug use: No   Sexual activity: Not Currently    Partners: Male    Birth control/protection: Surgical    Comment: 1st intercourse 78 yo-fewer than 5 partners, HX STI (HSV) GCG High Risk Medicare  Other Topics Concern   Not on file  Social History Narrative   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   Enjoys reading, yard work, cares for her mother during the day, her brother helps during then night, enjoys poker   1 daughter (in [place]) and 2 grand-daughters (In [place]- age 39 and 22)   Social Drivers of Corporate investment banker Strain: Low Risk  (02/18/2023)   Overall Financial Resource Strain (CARDIA)    Difficulty of Paying Living Expenses: Not hard at all  Food Insecurity: No Food Insecurity (02/18/2023)   Hunger Vital Sign    Worried About Running Out of Food in the Last Year: Never true    Ran Out of Food in the Last Year: Never true  Transportation Needs: No Transportation Needs (02/18/2023)   PRAPARE - Administrator, Civil Service (Medical): No    Lack of Transportation (Non-Medical): No  Physical Activity: Insufficiently Active (02/18/2023)   Exercise Vital Sign    Days of Exercise per Week: 2 days    Minutes of Exercise per Session: 20 min  Stress: No Stress Concern Present (02/18/2023)   [employer] of Occupational Health - Occupational Stress Questionnaire    Feeling of Stress : Not at all  Social Connections: Socially Isolated (02/18/2023)   Social Connection and Isolation Panel [NHANES]    Frequency of Communication with Friends and Family: More than three times a week    Frequency of Social Gatherings with Friends and  Family: More than three times a week    Attends Religious Services: Never    Database administrator or Organizations: No    Attends Banker Meetings: Never    Marital Status: Widowed  Intimate Partner Violence: Not At Risk (02/18/2023)   Humiliation, Afraid, Rape, and Kick questionnaire    Fear of Current or Ex-Partner: No    Emotionally Abused: No    Physically Abused: No  Sexually Abused: No    Outpatient Medications Prior to Visit  Medication Sig Dispense Refill   Calcium Carbonate-Vitamin D  (CALCIUM + D PO) Take 2 tablets by mouth daily.      Collagen-Boron-Hyaluronic Acid (MOVE FREE ULTRA JOINT HEALTH PO) Take by mouth.     cyanocobalamin  (VITAMIN B12) 1000 MCG tablet Take 1 tablet (1,000 mcg total) by mouth daily.     dapagliflozin propanediol (FARXIGA) 10 MG TABS tablet Take by mouth daily.     estradiol  (ESTRACE ) 0.5 MG tablet TAKE 1/2 TABLET BY MOUTH DAILY 45 tablet 0   feeding supplement (ENSURE ENLIVE / ENSURE PLUS) LIQD Take 237 mLs by mouth 3 (three) times daily between meals. 237 mL 12   folic acid  (FOLVITE ) 1 MG tablet Take 1 tablet (1 mg total) by mouth daily. 30 tablet 0   KRILL OIL PO Take 1 tablet by mouth daily.     linaclotide  (LINZESS ) 145 MCG CAPS capsule Take 1 capsule (145 mcg total) by mouth daily before breakfast. 30 capsule 3   metoprolol  succinate (TOPROL -XL) 100 MG 24 hr tablet Take 1 tablet (100 mg total) by mouth daily. Take with or immediately following a meal. 90 tablet 1   Multiple Vitamin (MULTIVITAMIN PO) Take by mouth.     Multiple Vitamin (MULTIVITAMIN) tablet Take 1 tablet by mouth daily.     OVER THE COUNTER MEDICATION Hair, Skin and Nails     pantoprazole  (PROTONIX ) 40 MG tablet Take 1 tablet (40 mg total) by mouth daily. 90 tablet 1   POTASSIUM PO Take by mouth.     vitamin C (ASCORBIC ACID) 500 MG tablet Take 500 mg by mouth daily.     telmisartan -hydrochlorothiazide (MICARDIS  HCT) 80-25 MG tablet Take 1 tablet by mouth daily.  90 tablet 1   No facility-administered medications prior to visit.    Allergies  Allergen Reactions   Atorvastatin     Muscle cramps   Fenofibrate      "Locked her muscles up"   Hydralazine      "kidney pain"   Trazodone Hcl Other (See Comments)    headache   Latex Rash    Or just redness     ROS See HPI    Objective:     Physical Exam Constitutional:      Appearance: She is well-developed.  Cardiovascular:     Rate and Rhythm: Normal rate and regular rhythm.     Heart sounds: Normal heart sounds. No murmur heard. Pulmonary:     Effort: Pulmonary effort is normal. No respiratory distress.     Breath sounds: Normal breath sounds. No wheezing.  Psychiatric:        Behavior: Behavior normal.        Thought Content: Thought content normal.        Judgment: Judgment normal.      BP (!) 164/88 (BP Location: Right Arm, Patient Position: Sitting, Cuff Size: Small)   Pulse 75   Temp 97.6 F (36.4 C) (Oral)   Resp 16   Ht 5' 3.5" (1.613 m)   Wt 146 lb (66.2 kg)   LMP 03/04/2002   SpO2 95%   BMI 25.46 kg/m  Wt Readings from Last 3 Encounters:  08/01/23 146 lb (66.2 kg)  02/18/23 147 lb (66.7 kg)  10/23/22 147 lb 9.6 oz (67 kg)       Assessment & Plan:   Problem List Items Addressed This Visit       Unprioritized   Vitamin D  deficiency -  Primary   On otc supplement 1000 mcg daily. Update level.      Primary hypertension   BP Readings from Last 3 Encounters:  08/01/23 (!) 164/88  10/23/22 133/83  05/02/22 136/84   Did not take AM meds. Intolerant to farxiga.  Restart micardis  HCT.       Relevant Medications   telmisartan -hydrochlorothiazide (MICARDIS  HCT) 80-25 MG tablet   Irritable bowel syndrome with constipation   Uses linzess  prn which is very helpful for her.       Hypertriglyceridemia   Lab Results  Component Value Date   CHOL 210 (H) 03/20/2022   HDL 58.90 03/20/2022   LDLDIRECT 107.0 03/20/2022   TRIG 275.0 (H) 03/20/2022   CHOLHDL  4 03/20/2022  Update lipid panel. Intolerant to statins.       Relevant Medications   telmisartan -hydrochlorothiazide (MICARDIS  HCT) 80-25 MG tablet   Other Relevant Orders   Lipid panel   Diabetes type 2, controlled (HCC)   Lab Results  Component Value Date   HGBA1C 6.5 10/23/2022   At goal. New diagnosis for her.  Monitor, update A1C.       Relevant Medications   dapagliflozin propanediol (FARXIGA) 10 MG TABS tablet   telmisartan -hydrochlorothiazide (MICARDIS  HCT) 80-25 MG tablet   Other Relevant Orders   HgB A1c   Urine Microalbumin w/creat. ratio   Comp Met (CMET)    I am having Kataleya L. Bernardi start on Vitamin D3. I am also having her maintain her multivitamin, Calcium Carbonate-Vitamin D  (CALCIUM + D PO), ascorbic acid, KRILL OIL PO, feeding supplement, folic acid , cyanocobalamin , Collagen-Boron-Hyaluronic Acid (MOVE FREE ULTRA JOINT HEALTH PO), linaclotide , OVER THE COUNTER MEDICATION, Multiple Vitamin (MULTIVITAMIN PO), POTASSIUM PO, metoprolol  succinate, pantoprazole , estradiol , dapagliflozin propanediol, and telmisartan -hydrochlorothiazide.  Meds ordered this encounter  Medications   telmisartan -hydrochlorothiazide (MICARDIS  HCT) 80-25 MG tablet    Sig: Take 1 tablet by mouth daily.    Dispense:  90 tablet    Refill:  1    Supervising Provider:   Randie Bustle A [[id number]]   Cholecalciferol (VITAMIN D3) 25 MCG (1000 UT) CAPS    Sig: Take 1 capsule (1,000 Units total) by mouth daily.    Supervising Provider:   Randie Bustle A [phone]

## 2023-08-01 NOTE — Addendum Note (Signed)
 Addended by: Dorrene Gaucher on: 08/01/2023 05:24 PM   Modules accepted: Level of Service

## 2023-08-01 NOTE — Assessment & Plan Note (Addendum)
 Lab Results  Component Value Date   HGBA1C 6.5 10/23/2022   At goal. New diagnosis for her.  Monitor, update A1C.

## 2023-08-01 NOTE — Patient Instructions (Addendum)
 VISIT SUMMARY:  Today, we discussed your diabetes and hypertension management. We addressed your concerns about your current blood pressure medication and made adjustments to your treatment plan.  YOUR PLAN:  DIABETES MELLITUS: Your A1c level is 6.5%, which confirms your diabetes diagnosis. We need to manage your cholesterol due to cardiovascular risk, but you have had issues with statins in the past. -We will recheck your cholesterol levels today. -If your cholesterol is high, we will discuss starting a new statin medication that may be better tolerated.  HYPERTENSION: Your blood pressure management has been complicated by recent changes in your medication.  [name] caused you discomfort in your back so you stopped it. -Restart your previous medication, telmisartan  hydrochlorothiazide. -We will recheck your blood pressure in four weeks to see how you are doing.

## 2023-08-01 NOTE — Addendum Note (Signed)
 Addended by: Marigene Shoulder on: 08/01/2023 02:31 PM   Modules accepted: Orders

## 2023-08-01 NOTE — Telephone Encounter (Signed)
 Electronic request sent

## 2023-08-01 NOTE — Assessment & Plan Note (Signed)
 BP Readings from Last 3 Encounters:  08/01/23 (!) 164/88  10/23/22 133/83  05/02/22 136/84   Did not take AM meds. Intolerant to farxiga.  Restart micardis  HCT.

## 2023-08-02 LAB — MICROALBUMIN / CREATININE URINE RATIO
Creatinine, Urine: 57 mg/dL (ref 20–275)
Microalb Creat Ratio: 5 mg/g{creat} (ref <30)
Microalb, Ur: 0.3 mg/dL

## 2023-08-19 ENCOUNTER — Other Ambulatory Visit: Payer: Self-pay | Admitting: Radiology

## 2023-08-19 DIAGNOSIS — Z79890 Hormone replacement therapy (postmenopausal): Secondary | ICD-10-CM

## 2023-08-19 NOTE — Telephone Encounter (Signed)
 Med refill request: estradiol  0.5mg  Last AEX: 05/02/22 Next AEX: 09/23/23 Last MMG (if hormonal med) 01/10/22 BI-RADS 2 benign Refill authorized: estradiol  0.5mg  Please approve or deny as appropriate.

## 2023-08-27 ENCOUNTER — Ambulatory Visit: Admitting: Family

## 2023-08-27 VITALS — BP 129/64 | HR 74 | Temp 98.5°F | Resp 16 | Ht 63.5 in | Wt 146.0 lb

## 2023-08-27 DIAGNOSIS — F329 Major depressive disorder, single episode, unspecified: Secondary | ICD-10-CM

## 2023-08-27 DIAGNOSIS — E559 Vitamin D deficiency, unspecified: Secondary | ICD-10-CM | POA: Diagnosis not present

## 2023-08-27 DIAGNOSIS — E1122 Type 2 diabetes mellitus with diabetic chronic kidney disease: Secondary | ICD-10-CM

## 2023-08-27 DIAGNOSIS — E781 Pure hyperglyceridemia: Secondary | ICD-10-CM | POA: Diagnosis not present

## 2023-08-27 DIAGNOSIS — K922 Gastrointestinal hemorrhage, unspecified: Secondary | ICD-10-CM | POA: Diagnosis not present

## 2023-08-27 DIAGNOSIS — K581 Irritable bowel syndrome with constipation: Secondary | ICD-10-CM

## 2023-08-27 DIAGNOSIS — I10 Essential (primary) hypertension: Secondary | ICD-10-CM

## 2023-08-27 DIAGNOSIS — Z79890 Hormone replacement therapy (postmenopausal): Secondary | ICD-10-CM

## 2023-08-27 LAB — COMPREHENSIVE METABOLIC PANEL WITH GFR
ALT: 18 U/L (ref 0–35)
AST: 26 U/L (ref 0–37)
Albumin: 4 g/dL (ref 3.5–5.2)
Alkaline Phosphatase: 46 U/L (ref 39–117)
BUN: 37 mg/dL — ABNORMAL HIGH (ref 6–23)
CO2: 31 meq/L (ref 19–32)
Calcium: 10.1 mg/dL (ref 8.4–10.5)
Chloride: 100 meq/L (ref 96–112)
Creatinine, Ser: 1 mg/dL (ref 0.40–1.20)
GFR: 55.07 mL/min — ABNORMAL LOW (ref >60.00)
Glucose, Bld: 112 mg/dL — ABNORMAL HIGH (ref 70–99)
Potassium: 4.3 meq/L (ref 3.5–5.1)
Sodium: 139 meq/L (ref 135–145)
Total Bilirubin: 1 mg/dL (ref 0.2–1.2)
Total Protein: 6.5 g/dL (ref 6.0–8.3)

## 2023-08-27 LAB — VITAMIN D 25 HYDROXY (VIT D DEFICIENCY, FRACTURES): VITD: 35.48 ng/mL (ref 30.00–100.00)

## 2023-08-27 MED ORDER — PANTOPRAZOLE SODIUM 40 MG PO TBEC: 40.0000 mg | DELAYED_RELEASE_TABLET | Freq: Every day | ORAL | 1 refills | Status: DC

## 2023-08-27 MED ORDER — METOPROLOL SUCCINATE ER 100 MG PO TB24: 100.0000 mg | ORAL_TABLET | Freq: Every day | ORAL | 0 refills | Status: DC

## 2023-08-27 NOTE — Assessment & Plan Note (Signed)
 Update vit D level. Continues vit D 1000mg  + caltrate with D.

## 2023-08-27 NOTE — Patient Instructions (Signed)
 VISIT SUMMARY:  Today we reviewed your hypertension management, bone health, gastrointestinal symptoms, and mood. Your blood pressure is well-controlled, and we will continue your current medications. We also discussed your vitamin D  supplementation and ordered a test to check your levels. Your mood has improved with social activities, and your gastrointestinal symptoms are managed with diet and medication.  YOUR PLAN:  HYPERTENSION: Your blood pressure is well-controlled with your current medications. -Refill metoprolol  XL 100 mg. -Continue taking Micardis  HCT as prescribed.  GASTROESOPHAGEAL REFLUX DISEASE (GERD): Your GERD symptoms are controlled with pantoprazole . -Refill pantoprazole .  CHRONIC CONSTIPATION: Your constipation is managed with dietary changes and Linzess  as needed. -Continue taking Linzess  as needed.  VITAMIN D  DEFICIENCY: You are taking vitamin D  supplements, but your levels have not been checked recently. -Order a vitamin D  level test.  GENERAL HEALTH MAINTENANCE: Your general health is good, with well-controlled blood sugar and cholesterol levels. -Schedule a follow-up appointment in 6 months. -Discuss estrogen use with your gynecologist.

## 2023-08-27 NOTE — Assessment & Plan Note (Signed)
 Lab Results  Component Value Date   HGBA1C 5.6 08/01/2023   HGBA1C 6.5 10/23/2022   HGBA1C 5.7 06/09/2020   Lab Results  Component Value Date   MICROALBUR 0.3 08/01/2023   LDLCALC 97 08/01/2023   CREATININE 0.90 08/01/2023   Stable.

## 2023-08-27 NOTE — Assessment & Plan Note (Signed)
 Mood is better. She is going out more with friends.

## 2023-08-27 NOTE — Assessment & Plan Note (Signed)
 Lab Results  Component Value Date   CHOL 192 08/01/2023   HDL 64.00 08/01/2023   LDLCALC 97 08/01/2023   LDLDIRECT 107.0 03/20/2022   TRIG 153.0 (H) 08/01/2023   CHOLHDL 3 08/01/2023   Trigs OK. Not on statin. Monitor.

## 2023-08-27 NOTE — Assessment & Plan Note (Signed)
 Only occasional symptoms.  Relieved by Linzess .

## 2023-08-27 NOTE — Assessment & Plan Note (Signed)
 BP Readings from Last 3 Encounters:  08/27/23 129/64  08/01/23 (!) 164/88  10/23/22 133/83   Stable on amlodipine  and micardis  hct. Continue same

## 2023-08-27 NOTE — Assessment & Plan Note (Signed)
 Update calcium.

## 2023-08-27 NOTE — Assessment & Plan Note (Signed)
 On 1/2 on estrace  0.5 mg. Advised pt to discuss continuation with GYN.

## 2023-08-27 NOTE — Progress Notes (Signed)
 Subjective:     Patient ID: [name], female    DOB: [birth year], 78 y.o.   MRN: [id number]  Chief Complaint  Patient presents with   Hypertension    Here for follow up    HPI  Discussed the use of AI scribe software for clinical note transcription with the patient, who gave verbal consent to proceed.  History of Present Illness  [name] is a 78 year old female who presents for a routine follow-up visit.  Hypertension management  - Blood pressure is well-controlled with metoprolol  XL 100 mg and Micardis  HCT.  - Requires a refill for metoprolol . Bone health and vitamin d  supplementation  - Takes vitamin D  1000 IU daily and calcium supplements for bone health.  - Vitamin D  levels have not been checked in several years.  - Avoids sun exposure due to heat.   Gastrointestinal symptoms  - Experiences occasional constipation, managed with Linzess  as needed.  - Increased intake of salads and fruits improves symptoms.  - Increased bread consumption worsens symptoms.  - No current issues with constipation. Mood and psychosocial well-being  - Mood has improved with increased socialization.  - Continues to experience some emotional challenges and grief related to loss of her late husband.  - Support from friends and participation in outings have been beneficial.      Health Maintenance Due  Topic Date Due   OPHTHALMOLOGY EXAM  Never done   Zoster Vaccines- Shingrix  (1 of 2) 01/17/1998   COVID-19 Vaccine (3 - 2024-25 season) 11/03/2022    Past Medical History:  Diagnosis Date   Cervical dysplasia    Endometrial hyperplasia    Fibroid    Atypical leiomyoma   History of colon polyps    Hyperglycemia 05/23/2014   Hyperglycemia    Hypertension     Past Surgical History:  Procedure Laterality Date   ABDOMINAL HYSTERECTOMY  2004   TAH_BSO   BIOPSY  09/21/2021   Procedure: BIOPSY;  Surgeon: Rollin Dover, MD;  Location: [facility];  Service:  Gastroenterology;;   BREAST BIOPSY Left 2000   BREAST BIOPSY Bilateral 1999   BREAST EXCISIONAL BIOPSY Left    benign   BREAST SURGERY  1998   Benign left breast lump   COLPOSCOPY     ESOPHAGOGASTRODUODENOSCOPY (EGD) WITH PROPOFOL  N/A 09/21/2021   Procedure: ESOPHAGOGASTRODUODENOSCOPY (EGD) WITH PROPOFOL ;  Surgeon: Rollin Dover, MD;  Location: [facility];  Service: Gastroenterology;  Laterality: N/A;   EXCISION MASS LOWER EXTREMETIES Right 05/30/2016   Procedure: EXCISION RIGHT THIGH MASS;  Surgeon: Debby Shipper, MD;  Location: [facility];  Service: General;  Laterality: Right;  EXCISION RIGHT THIGH MASS   HEMORRHOID SURGERY  1980's   LYMPH NODE BIOPSY Left 1975   ? removed lymph nodes in left leg--benign per pt   OOPHORECTOMY     BSO   TONSILLECTOMY  1990    Family History  Problem Relation Age of Onset   Hypertension Mother    Heart disease Mother    Diabetes Mother    Breast cancer Mother 1   Heart disease Father    Cancer Father        prostate   Cancer Sister 39       Leukemia, deceased   Breast cancer Maternal Aunt        over 3   Breast cancer Maternal Grandmother        over 75   Hypertension Brother    Diabetes Brother  Stroke Brother     Social History   Socioeconomic History   Marital status: Widowed    Spouse name: Not on file   Number of children: Not on file   Years of education: Not on file   Highest education level: Not on file  Occupational History   Not on file  Tobacco Use   Smoking status: Former    Current packs/day: 0.00    Average packs/day: 0.3 packs/day for 30.0 years (7.5 ttl pk-yrs)    Types: Cigarettes    Start date: 3    Quit date: 2013    Years since quitting: 12.4   Smokeless tobacco: Never  Vaping Use   Vaping status: Never Used  Substance and Sexual Activity   Alcohol use: Yes    Alcohol/week: 0.0 standard drinks of alcohol    Comment: 2-4 glasses of wine on weekends   Drug use: No   Sexual activity: Not  Currently    Partners: Male    Birth control/protection: Surgical    Comment: 1st intercourse 78 yo-fewer than 5 partners, HX STI (HSV) GCG High Risk Medicare  Other Topics Concern   Not on file  Social History Narrative   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   Enjoys reading, yard work, cares for her mother during the day, her brother helps during then night, enjoys poker   1 daughter (in [place]) and 2 grand-daughters (In [place]- age 30 and 61)   Social Drivers of Corporate investment banker Strain: Low Risk  (02/18/2023)   Overall Financial Resource Strain (CARDIA)    Difficulty of Paying Living Expenses: Not hard at all  Food Insecurity: No Food Insecurity (02/18/2023)   Hunger Vital Sign    Worried About Running Out of Food in the Last Year: Never true    Ran Out of Food in the Last Year: Never true  Transportation Needs: No Transportation Needs (02/18/2023)   PRAPARE - Administrator, Civil Service (Medical): No    Lack of Transportation (Non-Medical): No  Physical Activity: Insufficiently Active (02/18/2023)   Exercise Vital Sign    Days of Exercise per Week: 2 days    Minutes of Exercise per Session: 20 min  Stress: No Stress Concern Present (02/18/2023)   [employer] of Occupational Health - Occupational Stress Questionnaire    Feeling of Stress : Not at all  Social Connections: Socially Isolated (02/18/2023)   Social Connection and Isolation Panel    Frequency of Communication with Friends and Family: More than three times a week    Frequency of Social Gatherings with Friends and Family: More than three times a week    Attends Religious Services: Never    Database administrator or Organizations: No    Attends Banker Meetings: Never    Marital Status: Widowed  Intimate Partner Violence: Not At Risk (02/18/2023)    Humiliation, Afraid, Rape, and Kick questionnaire    Fear of Current or Ex-Partner: No    Emotionally Abused: No    Physically Abused: No    Sexually Abused: No    Outpatient Medications Prior to Visit  Medication Sig Dispense Refill   Calcium Carbonate-Vitamin D  (CALCIUM + D PO) Take 2 tablets by mouth daily.      Cholecalciferol (VITAMIN D3) 25 MCG (1000 UT) CAPS Take  1 capsule (1,000 Units total) by mouth daily.     Collagen-Boron-Hyaluronic Acid (MOVE FREE ULTRA JOINT HEALTH PO) Take by mouth.     cyanocobalamin  (VITAMIN B12) 1000 MCG tablet Take 1 tablet (1,000 mcg total) by mouth daily.     estradiol  (ESTRACE ) 0.5 MG tablet TAKE 1/2 TABLET BY MOUTH DAILY 45 tablet 0   feeding supplement (ENSURE ENLIVE / ENSURE PLUS) LIQD Take 237 mLs by mouth 3 (three) times daily between meals. 237 mL 12   folic acid  (FOLVITE ) 1 MG tablet Take 1 tablet (1 mg total) by mouth daily. 30 tablet 0   KRILL OIL PO Take 1 tablet by mouth daily.     linaclotide  (LINZESS ) 145 MCG CAPS capsule Take 1 capsule (145 mcg total) by mouth daily before breakfast. 30 capsule 3   Multiple Vitamin (MULTIVITAMIN PO) Take by mouth.     Multiple Vitamin (MULTIVITAMIN) tablet Take 1 tablet by mouth daily.     OVER THE COUNTER MEDICATION Hair, Skin and Nails     POTASSIUM PO Take by mouth.     telmisartan -hydrochlorothiazide (MICARDIS  HCT) 80-25 MG tablet Take 1 tablet by mouth daily. 90 tablet 1   vitamin C (ASCORBIC ACID) 500 MG tablet Take 500 mg by mouth daily.     metoprolol  succinate (TOPROL -XL) 100 MG 24 hr tablet Take 1 tablet (100 mg total) by mouth daily. Take with or immediately following a meal. 90 tablet 0   pantoprazole  (PROTONIX ) 40 MG tablet Take 1 tablet (40 mg total) by mouth daily. 90 tablet 1   dapagliflozin propanediol (FARXIGA) 10 MG TABS tablet Take by mouth daily.     No facility-administered medications prior to visit.    Allergies  Allergen Reactions   Atorvastatin     Muscle cramps    Fenofibrate      Locked her muscles up   Hydralazine      kidney pain   Trazodone Hcl Other (See Comments)    headache   Latex Rash    Or just redness     ROS See HPI    Objective:    Physical Exam   BP 129/64 (BP Location: Right Arm, Patient Position: Sitting, Cuff Size: Small)   Pulse 74   Temp 98.5 F (36.9 C) (Oral)   Resp 16   Ht 5' 3.5 (1.613 m)   Wt 146 lb (66.2 kg)   LMP 03/04/2002   SpO2 97%   BMI 25.46 kg/m  Wt Readings from Last 3 Encounters:  08/27/23 146 lb (66.2 kg)  08/01/23 146 lb (66.2 kg)  02/18/23 147 lb (66.7 kg)       Assessment & Plan:   Problem List Items Addressed This Visit       Unprioritized   Vitamin D  deficiency - Primary   Update vit D level. Continues vit D 1000mg  + caltrate with D.      Relevant Orders   Vitamin D  (25 hydroxy)   Primary hypertension   BP Readings from Last 3 Encounters:  08/27/23 129/64  08/01/23 (!) 164/88  10/23/22 133/83   Stable on amlodipine  and micardis  hct. Continue same      Relevant Medications   metoprolol  succinate (TOPROL -XL) 100 MG 24 hr tablet   Other Relevant Orders   Comp Met (CMET)   Irritable bowel syndrome with constipation   Only occasional symptoms.  Relieved by Linzess .      Relevant Medications   pantoprazole  (PROTONIX ) 40 MG tablet   Hypertriglyceridemia   Lab Results  Component Value Date   CHOL 192 08/01/2023   HDL 64.00 08/01/2023   LDLCALC 97 08/01/2023   LDLDIRECT 107.0 03/20/2022   TRIG 153.0 (H) 08/01/2023   CHOLHDL 3 08/01/2023   Trigs OK. Not on statin. Monitor.      Relevant Medications   metoprolol  succinate (TOPROL -XL) 100 MG 24 hr tablet   Hypercalcemia   Update calcium.       Hormone replacement therapy (HRT)   On 1/2 on estrace  0.5 mg. Advised pt to discuss continuation with GYN.      Diabetes type 2, controlled (HCC)   Lab Results  Component Value Date   HGBA1C 5.6 08/01/2023   HGBA1C 6.5 10/23/2022   HGBA1C 5.7 06/09/2020   Lab  Results  Component Value Date   MICROALBUR 0.3 08/01/2023   LDLCALC 97 08/01/2023   CREATININE 0.90 08/01/2023   Stable.       Relevant Orders   Urine Microalbumin w/creat. ratio   Depression   Mood is better. She is going out more with friends.      Other Visit Diagnoses       Gastrointestinal hemorrhage, unspecified gastrointestinal hemorrhage type       Relevant Medications   pantoprazole  (PROTONIX ) 40 MG tablet       I have discontinued [facility]'s dapagliflozin propanediol. I am also having her maintain her multivitamin, Calcium Carbonate-Vitamin D  (CALCIUM + D PO), ascorbic acid, KRILL OIL PO, feeding supplement, folic acid , cyanocobalamin , Collagen-Boron-Hyaluronic Acid (MOVE FREE ULTRA JOINT HEALTH PO), linaclotide , OVER THE COUNTER MEDICATION, Multiple Vitamin (MULTIVITAMIN PO), POTASSIUM PO, telmisartan -hydrochlorothiazide, Vitamin D3, estradiol , pantoprazole , and metoprolol  succinate.  Meds ordered this encounter  Medications   pantoprazole  (PROTONIX ) 40 MG tablet    Sig: Take 1 tablet (40 mg total) by mouth daily.    Dispense:  90 tablet    Refill:  1    Supervising Provider:   DOMENICA BLACKBIRD A [[id number]]   metoprolol  succinate (TOPROL -XL) 100 MG 24 hr tablet    Sig: Take 1 tablet (100 mg total) by mouth daily. Take with or immediately following a meal.    Dispense:  90 tablet    Refill:  0    Supervising Provider:   DOMENICA BLACKBIRD A [[id number]]

## 2023-08-28 ENCOUNTER — Ambulatory Visit: Payer: Self-pay | Admitting: Family

## 2023-09-19 ENCOUNTER — Encounter: Payer: Self-pay | Admitting: Advanced Practice Midwife

## 2023-09-23 ENCOUNTER — Encounter: Admitting: Radiology

## 2023-11-06 DIAGNOSIS — L820 Inflamed seborrheic keratosis: Secondary | ICD-10-CM | POA: Diagnosis not present

## 2023-11-06 DIAGNOSIS — L821 Other seborrheic keratosis: Secondary | ICD-10-CM | POA: Diagnosis not present

## 2023-11-06 DIAGNOSIS — D225 Melanocytic nevi of trunk: Secondary | ICD-10-CM | POA: Diagnosis not present

## 2023-11-13 ENCOUNTER — Ambulatory Visit (INDEPENDENT_AMBULATORY_CARE_PROVIDER_SITE_OTHER): Admitting: Radiology

## 2023-11-13 ENCOUNTER — Encounter: Payer: Self-pay | Admitting: Radiology

## 2023-11-13 VITALS — BP 120/76 | HR 82 | Ht 64.0 in | Wt 147.0 lb

## 2023-11-13 DIAGNOSIS — Z79890 Hormone replacement therapy (postmenopausal): Secondary | ICD-10-CM | POA: Diagnosis not present

## 2023-11-13 DIAGNOSIS — Z9289 Personal history of other medical treatment: Secondary | ICD-10-CM

## 2023-11-13 DIAGNOSIS — Z01419 Encounter for gynecological examination (general) (routine) without abnormal findings: Secondary | ICD-10-CM | POA: Diagnosis not present

## 2023-11-13 DIAGNOSIS — B009 Herpesviral infection, unspecified: Secondary | ICD-10-CM | POA: Diagnosis not present

## 2023-11-13 MED ORDER — ESTRADIOL 0.5 MG PO TABS: 0.2500 mg | ORAL_TABLET | Freq: Every day | ORAL | 4 refills | Status: DC

## 2023-11-13 NOTE — Progress Notes (Signed)
   [name] Jul 23, 1945 [id number]   History:  78 y.o. G1P1 presents for annual exam. No gyn concerns. S/p hyst 2004 for atypical hyperplasia. High risk medicare, hx of STD(HSV). Needs refill on oral estradiol . Aware of risks and benefits, would like to continue.  Gynecologic History Hysterectomy: 2004  Sexually active: no  Health Maintenance Last Pap: 2013.  Last mammogram: 01/10/22. Results were:  Last colonoscopy: 06/16/19. Results were:  Last Dexa: 10/16/16. Results were:  HRT [id number]  Past medical history, past surgical history, family history and social history were all reviewed and documented in the EPIC chart.  ROS:  A ROS was performed and pertinent positives and negatives are included.  Exam:  Vitals:   11/13/23 1023  BP: 120/76  Pulse: 82  SpO2: 96%  Weight: 147 lb (66.7 kg)  Height: 5' 4 (1.626 m)   Body mass index is 25.23 kg/m.  General appearance:  Normal Thyroid:  Symmetrical, normal in size, without palpable masses or nodularity. Respiratory  Auscultation:  Clear without wheezing or rhonchi Cardiovascular  Auscultation:  Regular rate, without rubs, murmurs or gallops  Edema/varicosities:  Not grossly evident Abdominal  Soft,nontender, without masses, guarding or rebound.  Liver/spleen:  No organomegaly noted  Hernia:  None appreciated  Skin  Inspection:  Grossly normal Breasts: Examined lying and sitting.   Right: Without masses, retractions, nipple discharge or axillary adenopathy.   Left: Without masses, retractions, nipple discharge or axillary adenopathy. Genitourinary   Inguinal/mons:  Normal without inguinal adenopathy  External genitalia:  Normal appearing vulva with no masses, tenderness, or lesions  BUS/Urethra/Skene's glands:  Normal  Vagina:  Normal appearing with normal color and discharge, no lesions. Atrophy mild  Cervix:  absent  Uterus:  absent  Adnexa/parametria:  absent  Anus and perineum: Normal    Darice Hoit,  CMA present for exam  Assessment/Plan:   1. Well woman exam with routine gynecological exam (Primary) Schedule overdue mammogram  2. Hormone replacement therapy (HRT) Risks and benefits reiterated  - estradiol  (ESTRACE ) 0.5 MG tablet; Take 0.5 tablets (0.25 mg total) by mouth daily.  Dispense: 45 tablet; Refill: 4     Return in 1 year for annual or sooner prn.  GINETTE SHASTA NOVAK WHNP-BC 10:32 AM 11/13/2023

## 2024-02-04 ENCOUNTER — Encounter: Payer: Self-pay | Admitting: *Deleted

## 2024-02-04 ENCOUNTER — Other Ambulatory Visit: Payer: Self-pay | Admitting: Family

## 2024-02-04 DIAGNOSIS — I10 Essential (primary) hypertension: Secondary | ICD-10-CM

## 2024-02-13 ENCOUNTER — Telehealth: Payer: Self-pay

## 2024-02-16 NOTE — Telephone Encounter (Signed)
 I discussed the death certificate and diabetes diagnosis with the patient's daughter.  She states that family member found her deceased in her recliner. Certificate has been completed in [facility].

## 2024-02-16 NOTE — Telephone Encounter (Signed)
 Copied from CRM [id number]. Topic: General - Deceased Patient >> 03/01/2024 10:00 AM Darshell M wrote: Name of caller: Damien  Date of death: 02-27-2024   Name of funeral home: [facility] [employer] [facility]  Phone number of funeral home: [phone]  Provider that needs to sign form: Eleanor Ponto  Timeline for signing: asap - Per Damien, death certificate was received but the provider did not sign it and the family would like the cause of death changed as family was not aware patient had diabetes. Death certificate has been updated with social security number and is ready for provider signature. Cremation services pending signed death certificate.

## 2024-03-03 ENCOUNTER — Ambulatory Visit: Admitting: Family

## 2024-03-04 NOTE — Telephone Encounter (Signed)
 EMS calling to make provider aware that pt passed away at her home. Funeral Home will be sending death certificate.

## 2024-03-04 DEATH — deceased

## 2024-03-12 ENCOUNTER — Ambulatory Visit: Admitting: Family
